# Patient Record
Sex: Male | Born: 1950 | Race: Black or African American | Hispanic: No
Health system: Western US, Academic
[De-identification: ages and names within clinical notes are randomized; demographics above are authoritative.]

## PROBLEM LIST (undated history)

## (undated) DIAGNOSIS — I219 Acute myocardial infarction, unspecified: Secondary | ICD-10-CM

## (undated) DIAGNOSIS — Z8639 Personal history of other endocrine, nutritional and metabolic disease: Secondary | ICD-10-CM

## (undated) DIAGNOSIS — N289 Disorder of kidney and ureter, unspecified: Secondary | ICD-10-CM

## (undated) DIAGNOSIS — E119 Type 2 diabetes mellitus without complications: Secondary | ICD-10-CM

## (undated) DIAGNOSIS — I2699 Other pulmonary embolism without acute cor pulmonale: Secondary | ICD-10-CM

## (undated) DIAGNOSIS — Z923 Personal history of irradiation: Secondary | ICD-10-CM

## (undated) HISTORY — DX: Acute myocardial infarction, unspecified: I21.9

## (undated) HISTORY — DX: Personal history of other endocrine, nutritional and metabolic disease: Z86.39

## (undated) HISTORY — DX: Other pulmonary embolism without acute cor pulmonale: I26.99

## (undated) HISTORY — DX: Disorder of kidney and ureter, unspecified: N28.9

## (undated) HISTORY — PX: ANKLE SURGERY: SHX546

## (undated) HISTORY — DX: Personal history of irradiation: Z92.3

## (undated) HISTORY — PX: PR UNLISTED PROCEDURE LEG/ANKLE: 27899

## (undated) HISTORY — DX: Type 2 diabetes mellitus without complications: E11.9

## (undated) MED ORDER — COLCRYS 0.6 MG OR TABS
ORAL_TABLET | ORAL | Status: AC
Start: 2016-05-09 — End: ?

## (undated) MED ORDER — COLCRYS 0.6 MG OR TABS
ORAL_TABLET | ORAL | Status: AC
Start: 2016-05-20 — End: ?

## (undated) MED ORDER — COLCRYS 0.6 MG OR TABS
ORAL_TABLET | ORAL | 0 refills | Status: AC
Start: 2016-05-01 — End: ?

## (undated) DEATH — deceased

---

## 1999-10-15 ENCOUNTER — Encounter (HOSPITAL_BASED_OUTPATIENT_CLINIC_OR_DEPARTMENT_OTHER): Payer: Self-pay

## 1999-12-23 ENCOUNTER — Encounter (HOSPITAL_BASED_OUTPATIENT_CLINIC_OR_DEPARTMENT_OTHER): Payer: Self-pay

## 2000-09-25 ENCOUNTER — Encounter (HOSPITAL_BASED_OUTPATIENT_CLINIC_OR_DEPARTMENT_OTHER): Payer: Self-pay

## 2000-12-29 ENCOUNTER — Encounter (HOSPITAL_BASED_OUTPATIENT_CLINIC_OR_DEPARTMENT_OTHER): Payer: Self-pay

## 2001-01-14 ENCOUNTER — Ambulatory Visit (HOSPITAL_BASED_OUTPATIENT_CLINIC_OR_DEPARTMENT_OTHER): Payer: Self-pay

## 2001-01-18 ENCOUNTER — Encounter (HOSPITAL_BASED_OUTPATIENT_CLINIC_OR_DEPARTMENT_OTHER): Payer: Self-pay

## 2001-01-22 ENCOUNTER — Encounter (HOSPITAL_BASED_OUTPATIENT_CLINIC_OR_DEPARTMENT_OTHER): Payer: Medicaid Other

## 2001-01-25 ENCOUNTER — Encounter (HOSPITAL_BASED_OUTPATIENT_CLINIC_OR_DEPARTMENT_OTHER): Payer: Medicaid Other

## 2001-01-27 ENCOUNTER — Encounter (HOSPITAL_BASED_OUTPATIENT_CLINIC_OR_DEPARTMENT_OTHER): Payer: Self-pay | Admitting: Rehabilitative and Restorative Service Providers"

## 2001-02-04 ENCOUNTER — Encounter (HOSPITAL_BASED_OUTPATIENT_CLINIC_OR_DEPARTMENT_OTHER): Payer: Medicaid Other | Admitting: Rehabilitative and Restorative Service Providers"

## 2001-02-15 ENCOUNTER — Encounter (HOSPITAL_BASED_OUTPATIENT_CLINIC_OR_DEPARTMENT_OTHER): Payer: Medicaid Other | Admitting: Rehabilitative and Restorative Service Providers"

## 2001-02-18 ENCOUNTER — Encounter (HOSPITAL_BASED_OUTPATIENT_CLINIC_OR_DEPARTMENT_OTHER): Payer: Medicaid Other

## 2001-02-22 ENCOUNTER — Encounter (HOSPITAL_BASED_OUTPATIENT_CLINIC_OR_DEPARTMENT_OTHER): Payer: Medicaid Other

## 2001-02-24 ENCOUNTER — Encounter (HOSPITAL_BASED_OUTPATIENT_CLINIC_OR_DEPARTMENT_OTHER): Payer: Medicaid Other

## 2001-03-01 ENCOUNTER — Encounter (HOSPITAL_BASED_OUTPATIENT_CLINIC_OR_DEPARTMENT_OTHER): Payer: Medicaid Other

## 2001-03-04 ENCOUNTER — Encounter (HOSPITAL_BASED_OUTPATIENT_CLINIC_OR_DEPARTMENT_OTHER): Payer: Self-pay

## 2001-03-04 ENCOUNTER — Encounter (HOSPITAL_BASED_OUTPATIENT_CLINIC_OR_DEPARTMENT_OTHER): Payer: Medicaid Other

## 2001-03-08 ENCOUNTER — Encounter (HOSPITAL_BASED_OUTPATIENT_CLINIC_OR_DEPARTMENT_OTHER): Payer: Medicaid Other

## 2001-03-10 ENCOUNTER — Encounter (HOSPITAL_BASED_OUTPATIENT_CLINIC_OR_DEPARTMENT_OTHER): Payer: Medicaid Other

## 2001-03-17 ENCOUNTER — Encounter (HOSPITAL_BASED_OUTPATIENT_CLINIC_OR_DEPARTMENT_OTHER): Payer: Self-pay

## 2001-03-23 ENCOUNTER — Encounter (HOSPITAL_BASED_OUTPATIENT_CLINIC_OR_DEPARTMENT_OTHER): Payer: Medicaid Other | Admitting: Rehabilitative and Restorative Service Providers"

## 2001-09-30 ENCOUNTER — Encounter (HOSPITAL_BASED_OUTPATIENT_CLINIC_OR_DEPARTMENT_OTHER): Payer: Medicaid Other

## 2001-10-28 ENCOUNTER — Encounter (HOSPITAL_BASED_OUTPATIENT_CLINIC_OR_DEPARTMENT_OTHER): Payer: Medicaid Other

## 2002-06-20 ENCOUNTER — Ambulatory Visit (EMERGENCY_DEPARTMENT_HOSPITAL): Payer: Medicaid Other

## 2002-06-22 ENCOUNTER — Encounter (HOSPITAL_BASED_OUTPATIENT_CLINIC_OR_DEPARTMENT_OTHER): Payer: Medicaid Other

## 2002-08-24 ENCOUNTER — Encounter (HOSPITAL_BASED_OUTPATIENT_CLINIC_OR_DEPARTMENT_OTHER): Payer: Medicaid Other | Admitting: Registered Nurse

## 2002-09-07 ENCOUNTER — Encounter (HOSPITAL_BASED_OUTPATIENT_CLINIC_OR_DEPARTMENT_OTHER): Payer: Medicaid Other | Admitting: Registered Nurse

## 2002-09-28 ENCOUNTER — Ambulatory Visit (EMERGENCY_DEPARTMENT_HOSPITAL): Payer: Medicaid Other

## 2002-10-11 ENCOUNTER — Encounter (HOSPITAL_BASED_OUTPATIENT_CLINIC_OR_DEPARTMENT_OTHER): Payer: Medicaid Other

## 2003-01-25 ENCOUNTER — Ambulatory Visit (EMERGENCY_DEPARTMENT_HOSPITAL): Payer: Medicaid Other

## 2003-09-20 ENCOUNTER — Encounter (HOSPITAL_BASED_OUTPATIENT_CLINIC_OR_DEPARTMENT_OTHER): Payer: Medicaid Other | Admitting: Registered Nurse

## 2003-10-23 ENCOUNTER — Encounter (HOSPITAL_BASED_OUTPATIENT_CLINIC_OR_DEPARTMENT_OTHER): Payer: Medicaid Other | Admitting: Physician Assistant

## 2003-11-29 ENCOUNTER — Encounter (HOSPITAL_BASED_OUTPATIENT_CLINIC_OR_DEPARTMENT_OTHER): Payer: Medicaid Other

## 2004-03-30 ENCOUNTER — Ambulatory Visit (EMERGENCY_DEPARTMENT_HOSPITAL): Payer: Medicaid Other

## 2004-03-30 ENCOUNTER — Encounter (HOSPITAL_BASED_OUTPATIENT_CLINIC_OR_DEPARTMENT_OTHER): Payer: Medicaid Other | Admitting: Rehabilitative and Restorative Service Providers"

## 2004-04-28 ENCOUNTER — Ambulatory Visit (EMERGENCY_DEPARTMENT_HOSPITAL): Payer: Medicaid Other

## 2004-05-02 ENCOUNTER — Encounter (HOSPITAL_BASED_OUTPATIENT_CLINIC_OR_DEPARTMENT_OTHER): Payer: Medicaid Other | Admitting: Nurse Practitioner

## 2004-07-10 ENCOUNTER — Ambulatory Visit (EMERGENCY_DEPARTMENT_HOSPITAL): Payer: Medicaid Other

## 2004-07-23 ENCOUNTER — Ambulatory Visit (EMERGENCY_DEPARTMENT_HOSPITAL): Payer: Medicaid Other

## 2004-07-31 ENCOUNTER — Ambulatory Visit (EMERGENCY_DEPARTMENT_HOSPITAL): Payer: Medicaid Other

## 2004-08-13 ENCOUNTER — Ambulatory Visit (EMERGENCY_DEPARTMENT_HOSPITAL): Payer: Medicaid Other

## 2004-08-15 ENCOUNTER — Encounter (HOSPITAL_BASED_OUTPATIENT_CLINIC_OR_DEPARTMENT_OTHER): Payer: Medicaid Other

## 2004-09-06 ENCOUNTER — Encounter (HOSPITAL_BASED_OUTPATIENT_CLINIC_OR_DEPARTMENT_OTHER): Payer: Medicaid Other | Admitting: Nurse Practitioner

## 2004-09-13 ENCOUNTER — Encounter (HOSPITAL_BASED_OUTPATIENT_CLINIC_OR_DEPARTMENT_OTHER): Payer: Medicaid Other

## 2004-09-17 ENCOUNTER — Encounter (HOSPITAL_BASED_OUTPATIENT_CLINIC_OR_DEPARTMENT_OTHER): Payer: Medicaid Other

## 2004-09-27 ENCOUNTER — Encounter (HOSPITAL_BASED_OUTPATIENT_CLINIC_OR_DEPARTMENT_OTHER): Payer: Medicaid Other

## 2004-10-21 ENCOUNTER — Encounter (HOSPITAL_BASED_OUTPATIENT_CLINIC_OR_DEPARTMENT_OTHER): Payer: Medicaid Other | Admitting: Nurse Practitioner

## 2004-10-25 ENCOUNTER — Encounter (HOSPITAL_BASED_OUTPATIENT_CLINIC_OR_DEPARTMENT_OTHER): Payer: Medicaid Other

## 2005-01-18 ENCOUNTER — Encounter (HOSPITAL_BASED_OUTPATIENT_CLINIC_OR_DEPARTMENT_OTHER): Payer: Medicaid Other | Admitting: Rehabilitative and Restorative Service Providers"

## 2005-01-18 ENCOUNTER — Ambulatory Visit (EMERGENCY_DEPARTMENT_HOSPITAL): Payer: Medicaid Other

## 2005-02-12 ENCOUNTER — Encounter (HOSPITAL_BASED_OUTPATIENT_CLINIC_OR_DEPARTMENT_OTHER): Payer: Medicaid Other | Admitting: Nurse Practitioner

## 2005-02-25 ENCOUNTER — Ambulatory Visit (HOSPITAL_BASED_OUTPATIENT_CLINIC_OR_DEPARTMENT_OTHER): Payer: Medicaid Other

## 2005-03-04 ENCOUNTER — Encounter (HOSPITAL_BASED_OUTPATIENT_CLINIC_OR_DEPARTMENT_OTHER): Payer: Medicaid Other

## 2005-03-12 ENCOUNTER — Encounter (HOSPITAL_BASED_OUTPATIENT_CLINIC_OR_DEPARTMENT_OTHER): Payer: Medicaid Other | Admitting: Rehabilitative and Restorative Service Providers"

## 2005-03-31 ENCOUNTER — Ambulatory Visit (EMERGENCY_DEPARTMENT_HOSPITAL): Payer: Medicaid Other

## 2005-04-08 ENCOUNTER — Encounter (HOSPITAL_BASED_OUTPATIENT_CLINIC_OR_DEPARTMENT_OTHER): Payer: Medicaid Other

## 2005-06-16 ENCOUNTER — Encounter (HOSPITAL_BASED_OUTPATIENT_CLINIC_OR_DEPARTMENT_OTHER): Payer: Medicaid Other | Admitting: Hospitalist

## 2005-09-22 ENCOUNTER — Encounter (HOSPITAL_BASED_OUTPATIENT_CLINIC_OR_DEPARTMENT_OTHER): Payer: Medicaid Other | Admitting: Hospitalist

## 2005-10-07 ENCOUNTER — Encounter (HOSPITAL_BASED_OUTPATIENT_CLINIC_OR_DEPARTMENT_OTHER): Payer: Medicaid Other | Admitting: Audiologist

## 2005-10-14 ENCOUNTER — Encounter (HOSPITAL_BASED_OUTPATIENT_CLINIC_OR_DEPARTMENT_OTHER): Payer: Medicaid Other | Admitting: Audiologist

## 2006-03-06 ENCOUNTER — Encounter (HOSPITAL_BASED_OUTPATIENT_CLINIC_OR_DEPARTMENT_OTHER): Payer: Medicaid Other

## 2006-03-27 ENCOUNTER — Encounter (HOSPITAL_BASED_OUTPATIENT_CLINIC_OR_DEPARTMENT_OTHER): Payer: Medicaid Other | Admitting: Hospitalist

## 2006-05-26 ENCOUNTER — Encounter (HOSPITAL_BASED_OUTPATIENT_CLINIC_OR_DEPARTMENT_OTHER): Payer: Self-pay | Admitting: Hospitalist

## 2006-06-15 ENCOUNTER — Encounter (HOSPITAL_BASED_OUTPATIENT_CLINIC_OR_DEPARTMENT_OTHER): Payer: Self-pay | Admitting: Hospitalist

## 2006-06-29 ENCOUNTER — Encounter (HOSPITAL_BASED_OUTPATIENT_CLINIC_OR_DEPARTMENT_OTHER): Payer: Medicaid Other | Admitting: Hospitalist

## 2006-07-31 ENCOUNTER — Ambulatory Visit (EMERGENCY_DEPARTMENT_HOSPITAL): Payer: Medicaid Other

## 2006-08-05 ENCOUNTER — Encounter (HOSPITAL_BASED_OUTPATIENT_CLINIC_OR_DEPARTMENT_OTHER): Payer: Medicaid Other

## 2006-09-16 ENCOUNTER — Ambulatory Visit (EMERGENCY_DEPARTMENT_HOSPITAL): Payer: Medicaid Other

## 2006-10-25 ENCOUNTER — Ambulatory Visit (EMERGENCY_DEPARTMENT_HOSPITAL): Payer: Medicaid Other

## 2006-10-30 ENCOUNTER — Encounter (HOSPITAL_BASED_OUTPATIENT_CLINIC_OR_DEPARTMENT_OTHER): Payer: Medicaid Other | Admitting: Hospitalist

## 2006-11-03 ENCOUNTER — Encounter (HOSPITAL_BASED_OUTPATIENT_CLINIC_OR_DEPARTMENT_OTHER): Payer: Medicaid Other | Admitting: Rehabilitative and Restorative Service Providers"

## 2006-11-06 ENCOUNTER — Encounter (HOSPITAL_BASED_OUTPATIENT_CLINIC_OR_DEPARTMENT_OTHER): Payer: Medicaid Other | Admitting: Registered"

## 2006-11-09 ENCOUNTER — Encounter (HOSPITAL_BASED_OUTPATIENT_CLINIC_OR_DEPARTMENT_OTHER): Payer: Medicaid Other | Admitting: Rehabilitative and Restorative Service Providers"

## 2006-11-12 ENCOUNTER — Encounter (HOSPITAL_BASED_OUTPATIENT_CLINIC_OR_DEPARTMENT_OTHER): Payer: Medicaid Other | Admitting: Rehabilitative and Restorative Service Providers"

## 2006-11-13 ENCOUNTER — Encounter (HOSPITAL_BASED_OUTPATIENT_CLINIC_OR_DEPARTMENT_OTHER): Payer: Medicaid Other | Admitting: Registered"

## 2006-11-26 ENCOUNTER — Encounter (HOSPITAL_BASED_OUTPATIENT_CLINIC_OR_DEPARTMENT_OTHER): Payer: Medicaid Other | Admitting: Rehabilitative and Restorative Service Providers"

## 2006-12-03 ENCOUNTER — Encounter (HOSPITAL_BASED_OUTPATIENT_CLINIC_OR_DEPARTMENT_OTHER): Payer: Medicaid Other | Admitting: Rehabilitative and Restorative Service Providers"

## 2007-01-28 ENCOUNTER — Ambulatory Visit (EMERGENCY_DEPARTMENT_HOSPITAL): Payer: Medicaid Other

## 2007-02-03 ENCOUNTER — Encounter (HOSPITAL_BASED_OUTPATIENT_CLINIC_OR_DEPARTMENT_OTHER): Payer: Medicaid Other

## 2007-04-06 ENCOUNTER — Ambulatory Visit (EMERGENCY_DEPARTMENT_HOSPITAL): Payer: Medicaid Other

## 2007-06-22 ENCOUNTER — Encounter (HOSPITAL_BASED_OUTPATIENT_CLINIC_OR_DEPARTMENT_OTHER): Payer: Self-pay

## 2007-06-22 ENCOUNTER — Encounter (HOSPITAL_BASED_OUTPATIENT_CLINIC_OR_DEPARTMENT_OTHER): Payer: Medicaid Other

## 2007-08-21 ENCOUNTER — Ambulatory Visit (EMERGENCY_DEPARTMENT_HOSPITAL): Payer: Medicaid Other

## 2007-08-25 ENCOUNTER — Encounter (HOSPITAL_BASED_OUTPATIENT_CLINIC_OR_DEPARTMENT_OTHER): Payer: Medicaid Other

## 2007-08-26 ENCOUNTER — Encounter (HOSPITAL_BASED_OUTPATIENT_CLINIC_OR_DEPARTMENT_OTHER): Payer: Medicaid Other

## 2007-10-05 ENCOUNTER — Encounter (HOSPITAL_BASED_OUTPATIENT_CLINIC_OR_DEPARTMENT_OTHER): Payer: Medicaid Other

## 2007-11-05 ENCOUNTER — Encounter (HOSPITAL_BASED_OUTPATIENT_CLINIC_OR_DEPARTMENT_OTHER): Payer: Medicaid Other

## 2008-02-04 ENCOUNTER — Ambulatory Visit (EMERGENCY_DEPARTMENT_HOSPITAL): Payer: Medicaid Other

## 2008-03-13 ENCOUNTER — Encounter (HOSPITAL_BASED_OUTPATIENT_CLINIC_OR_DEPARTMENT_OTHER): Payer: Medicaid Other

## 2008-03-14 ENCOUNTER — Encounter (HOSPITAL_BASED_OUTPATIENT_CLINIC_OR_DEPARTMENT_OTHER): Payer: Medicaid Other

## 2008-03-17 ENCOUNTER — Encounter (HOSPITAL_BASED_OUTPATIENT_CLINIC_OR_DEPARTMENT_OTHER): Payer: Self-pay

## 2008-03-17 ENCOUNTER — Encounter (HOSPITAL_BASED_OUTPATIENT_CLINIC_OR_DEPARTMENT_OTHER): Payer: Medicaid Other

## 2008-05-08 ENCOUNTER — Encounter (HOSPITAL_BASED_OUTPATIENT_CLINIC_OR_DEPARTMENT_OTHER): Payer: Medicaid Other

## 2008-05-26 ENCOUNTER — Encounter (HOSPITAL_BASED_OUTPATIENT_CLINIC_OR_DEPARTMENT_OTHER): Payer: Medicaid Other

## 2008-06-29 ENCOUNTER — Ambulatory Visit (HOSPITAL_BASED_OUTPATIENT_CLINIC_OR_DEPARTMENT_OTHER): Payer: Medicaid Other

## 2008-07-04 ENCOUNTER — Ambulatory Visit (HOSPITAL_BASED_OUTPATIENT_CLINIC_OR_DEPARTMENT_OTHER): Payer: Medicaid Other | Admitting: Rehabilitative and Restorative Service Providers"

## 2008-07-10 ENCOUNTER — Encounter (HOSPITAL_BASED_OUTPATIENT_CLINIC_OR_DEPARTMENT_OTHER): Payer: Medicaid Other | Admitting: Rehabilitative and Restorative Service Providers"

## 2008-07-13 ENCOUNTER — Encounter (HOSPITAL_BASED_OUTPATIENT_CLINIC_OR_DEPARTMENT_OTHER): Payer: Medicaid Other | Admitting: Rehabilitative and Restorative Service Providers"

## 2008-07-20 ENCOUNTER — Encounter (HOSPITAL_BASED_OUTPATIENT_CLINIC_OR_DEPARTMENT_OTHER): Payer: Medicaid Other

## 2008-07-27 ENCOUNTER — Encounter (HOSPITAL_BASED_OUTPATIENT_CLINIC_OR_DEPARTMENT_OTHER): Payer: Medicaid Other | Admitting: Rehabilitative and Restorative Service Providers"

## 2008-07-31 ENCOUNTER — Encounter (HOSPITAL_BASED_OUTPATIENT_CLINIC_OR_DEPARTMENT_OTHER): Payer: Medicaid Other

## 2008-08-10 ENCOUNTER — Encounter (HOSPITAL_BASED_OUTPATIENT_CLINIC_OR_DEPARTMENT_OTHER): Payer: Medicaid Other

## 2008-08-14 ENCOUNTER — Encounter (HOSPITAL_BASED_OUTPATIENT_CLINIC_OR_DEPARTMENT_OTHER): Payer: Medicaid Other

## 2008-08-16 ENCOUNTER — Encounter (HOSPITAL_BASED_OUTPATIENT_CLINIC_OR_DEPARTMENT_OTHER): Payer: Medicaid Other

## 2008-08-21 ENCOUNTER — Encounter (HOSPITAL_BASED_OUTPATIENT_CLINIC_OR_DEPARTMENT_OTHER): Payer: Medicaid Other

## 2008-08-29 ENCOUNTER — Encounter (HOSPITAL_BASED_OUTPATIENT_CLINIC_OR_DEPARTMENT_OTHER): Payer: Medicaid Other

## 2008-10-25 ENCOUNTER — Ambulatory Visit (EMERGENCY_DEPARTMENT_HOSPITAL): Payer: Medicaid Other

## 2008-11-24 ENCOUNTER — Encounter (HOSPITAL_BASED_OUTPATIENT_CLINIC_OR_DEPARTMENT_OTHER): Payer: Self-pay

## 2008-11-24 ENCOUNTER — Ambulatory Visit (HOSPITAL_BASED_OUTPATIENT_CLINIC_OR_DEPARTMENT_OTHER): Payer: Medicaid Other

## 2008-11-24 DIAGNOSIS — E785 Hyperlipidemia, unspecified: Secondary | ICD-10-CM

## 2008-11-24 DIAGNOSIS — IMO0002 Reserved for concepts with insufficient information to code with codable children: Secondary | ICD-10-CM

## 2008-11-24 DIAGNOSIS — E669 Obesity, unspecified: Secondary | ICD-10-CM

## 2008-11-24 DIAGNOSIS — M159 Polyosteoarthritis, unspecified: Secondary | ICD-10-CM

## 2008-11-24 DIAGNOSIS — J45902 Unspecified asthma with status asthmaticus: Secondary | ICD-10-CM

## 2008-11-24 HISTORY — DX: Unspecified asthma with status asthmaticus: J45.902

## 2008-11-24 HISTORY — DX: Obesity, unspecified: E66.9

## 2008-11-24 HISTORY — DX: Polyosteoarthritis, unspecified: M15.9

## 2008-11-24 HISTORY — DX: Hyperlipidemia, unspecified: E78.5

## 2008-11-24 HISTORY — DX: Reserved for concepts with insufficient information to code with codable children: IMO0002

## 2008-11-24 NOTE — Progress Notes (Signed)
HPI:    58 year old male complaining of blurry vision with reading for the last 20 years.  He says he has tried reading glasses in the past, which have helped, but he has lost his reading glasses.  Otherwise, no complaints with his distance vision, no flashing lights/floaters.    Past Medical History:  1. 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 GERD  3. Obesity.  4. Asthma.  5. Seasonal allergies.  6. History of gout in the past  7. Osteoarthritis in left knee  8. Hyperlipidemia.   9. History of H. pylori, status post triple drug therapy in October 2005.   10. L ear hi frequency hearing loss.  11. Possible obstructive sleep apnea.    ROS:   Is pain part of the reason you came to clinic today? No  Wears glasses: NO  Wears contact lenses:NO  Eye symptoms: as per HPI  Fourteen point review of systems is otherwise negative   Medical, surgical, family history reviewed today: YES    PHYSICAL EXAM:  Eyes: See Eye Exam   Constitutional: Appears healthy   Psychiatric: Mood normal   Neurologic: Face is symmetric   Skin: Facial rashes: None   Head: Atraumatic   ENT: External ears and nose are normal in appearance   Respiratory: Normal respiratory effort     Eye Exam: See above    Assessment/Plan:  1) Presbyopia  - Discussed with patient the need for reading glasses as he ages.  He understands and is happy to try reading glasses  - Recommend +2.0 D lenses    Return as needed

## 2009-02-12 ENCOUNTER — Ambulatory Visit (HOSPITAL_BASED_OUTPATIENT_CLINIC_OR_DEPARTMENT_OTHER): Payer: Self-pay | Admitting: Nurse Practitioner

## 2009-02-28 ENCOUNTER — Ambulatory Visit (HOSPITAL_BASED_OUTPATIENT_CLINIC_OR_DEPARTMENT_OTHER): Payer: Medicaid Other | Attending: Internal Medicine

## 2009-02-28 DIAGNOSIS — E669 Obesity, unspecified: Secondary | ICD-10-CM | POA: Insufficient documentation

## 2009-02-28 DIAGNOSIS — H918X9 Other specified hearing loss, unspecified ear: Secondary | ICD-10-CM | POA: Insufficient documentation

## 2009-02-28 DIAGNOSIS — N289 Disorder of kidney and ureter, unspecified: Secondary | ICD-10-CM | POA: Insufficient documentation

## 2009-02-28 DIAGNOSIS — E785 Hyperlipidemia, unspecified: Secondary | ICD-10-CM | POA: Insufficient documentation

## 2009-02-28 DIAGNOSIS — M1990 Unspecified osteoarthritis, unspecified site: Secondary | ICD-10-CM | POA: Insufficient documentation

## 2009-03-05 ENCOUNTER — Ambulatory Visit (HOSPITAL_BASED_OUTPATIENT_CLINIC_OR_DEPARTMENT_OTHER): Admit: 2009-03-05 | Discharge: 2009-03-05 | Disposition: A | Discharge status: Home / Self Care | Payer: Medicaid Other

## 2009-03-05 DIAGNOSIS — I10 Essential (primary) hypertension: Secondary | ICD-10-CM | POA: Insufficient documentation

## 2009-03-12 ENCOUNTER — Ambulatory Visit (HOSPITAL_BASED_OUTPATIENT_CLINIC_OR_DEPARTMENT_OTHER): Admit: 2009-03-12 | Discharge: 2009-03-12 | Disposition: A | Discharge status: Home / Self Care | Payer: Medicaid Other

## 2009-03-12 DIAGNOSIS — Z0000 Encounter for general adult medical examination without abnormal findings: Secondary | ICD-10-CM | POA: Insufficient documentation

## 2009-09-17 ENCOUNTER — Ambulatory Visit (HOSPITAL_BASED_OUTPATIENT_CLINIC_OR_DEPARTMENT_OTHER): Payer: Medicaid Other | Attending: Internal Medicine

## 2009-09-17 DIAGNOSIS — Z139 Encounter for screening, unspecified: Secondary | ICD-10-CM | POA: Insufficient documentation

## 2009-09-17 DIAGNOSIS — N189 Chronic kidney disease, unspecified: Secondary | ICD-10-CM | POA: Insufficient documentation

## 2009-10-02 ENCOUNTER — Ambulatory Visit (HOSPITAL_BASED_OUTPATIENT_CLINIC_OR_DEPARTMENT_OTHER): Payer: Medicaid Other

## 2009-10-02 DIAGNOSIS — R944 Abnormal results of kidney function studies: Secondary | ICD-10-CM | POA: Insufficient documentation

## 2009-10-15 ENCOUNTER — Ambulatory Visit (HOSPITAL_BASED_OUTPATIENT_CLINIC_OR_DEPARTMENT_OTHER): Payer: Medicaid Other | Admitting: Nurse Practitioner

## 2009-10-23 ENCOUNTER — Ambulatory Visit (HOSPITAL_BASED_OUTPATIENT_CLINIC_OR_DEPARTMENT_OTHER): Payer: Medicaid Other | Attending: Nurse Practitioner | Admitting: Nurse Practitioner

## 2009-10-23 DIAGNOSIS — N289 Disorder of kidney and ureter, unspecified: Secondary | ICD-10-CM | POA: Insufficient documentation

## 2009-10-23 DIAGNOSIS — R030 Elevated blood-pressure reading, without diagnosis of hypertension: Secondary | ICD-10-CM | POA: Insufficient documentation

## 2009-10-23 DIAGNOSIS — G479 Sleep disorder, unspecified: Secondary | ICD-10-CM | POA: Insufficient documentation

## 2009-12-03 ENCOUNTER — Ambulatory Visit (HOSPITAL_BASED_OUTPATIENT_CLINIC_OR_DEPARTMENT_OTHER): Payer: Medicaid Other | Attending: Internal Medicine

## 2009-12-03 DIAGNOSIS — N289 Disorder of kidney and ureter, unspecified: Secondary | ICD-10-CM | POA: Insufficient documentation

## 2009-12-03 DIAGNOSIS — E785 Hyperlipidemia, unspecified: Secondary | ICD-10-CM | POA: Insufficient documentation

## 2009-12-03 DIAGNOSIS — R944 Abnormal results of kidney function studies: Secondary | ICD-10-CM | POA: Insufficient documentation

## 2009-12-03 DIAGNOSIS — H918X9 Other specified hearing loss, unspecified ear: Secondary | ICD-10-CM | POA: Insufficient documentation

## 2009-12-03 DIAGNOSIS — E669 Obesity, unspecified: Secondary | ICD-10-CM | POA: Insufficient documentation

## 2009-12-03 DIAGNOSIS — M1990 Unspecified osteoarthritis, unspecified site: Secondary | ICD-10-CM | POA: Insufficient documentation

## 2009-12-10 ENCOUNTER — Ambulatory Visit (HOSPITAL_BASED_OUTPATIENT_CLINIC_OR_DEPARTMENT_OTHER): Payer: Medicaid Other | Admitting: Registered"

## 2009-12-13 ENCOUNTER — Ambulatory Visit (HOSPITAL_BASED_OUTPATIENT_CLINIC_OR_DEPARTMENT_OTHER): Admit: 2009-12-13 | Discharge: 2009-12-13 | Disposition: A | Discharge status: Home / Self Care | Payer: Medicaid Other

## 2009-12-13 DIAGNOSIS — Z0000 Encounter for general adult medical examination without abnormal findings: Secondary | ICD-10-CM | POA: Insufficient documentation

## 2010-01-11 ENCOUNTER — Encounter (HOSPITAL_BASED_OUTPATIENT_CLINIC_OR_DEPARTMENT_OTHER): Payer: Medicaid Other | Admitting: Registered"

## 2010-03-14 ENCOUNTER — Ambulatory Visit (HOSPITAL_BASED_OUTPATIENT_CLINIC_OR_DEPARTMENT_OTHER): Payer: Medicaid Other | Attending: Internal Medicine | Admitting: Internal Medicine

## 2010-03-14 DIAGNOSIS — M1990 Unspecified osteoarthritis, unspecified site: Secondary | ICD-10-CM | POA: Insufficient documentation

## 2010-03-14 DIAGNOSIS — R030 Elevated blood-pressure reading, without diagnosis of hypertension: Secondary | ICD-10-CM | POA: Insufficient documentation

## 2010-03-14 DIAGNOSIS — E785 Hyperlipidemia, unspecified: Secondary | ICD-10-CM | POA: Insufficient documentation

## 2010-03-14 DIAGNOSIS — L279 Dermatitis due to unspecified substance taken internally: Secondary | ICD-10-CM | POA: Insufficient documentation

## 2010-03-25 ENCOUNTER — Ambulatory Visit (HOSPITAL_BASED_OUTPATIENT_CLINIC_OR_DEPARTMENT_OTHER)
Admit: 2010-03-25 | Discharge: 2010-03-25 | Disposition: A | Discharge status: Home / Self Care | Payer: Medicaid Other | Attending: Internal Medicine | Admitting: Internal Medicine

## 2010-03-25 DIAGNOSIS — E785 Hyperlipidemia, unspecified: Secondary | ICD-10-CM | POA: Insufficient documentation

## 2010-04-05 ENCOUNTER — Encounter (HOSPITAL_BASED_OUTPATIENT_CLINIC_OR_DEPARTMENT_OTHER): Payer: Medicaid Other | Admitting: Internal Medicine

## 2010-06-25 ENCOUNTER — Encounter (HOSPITAL_BASED_OUTPATIENT_CLINIC_OR_DEPARTMENT_OTHER): Payer: Medicaid Other | Admitting: Internal Medicine

## 2010-08-17 ENCOUNTER — Other Ambulatory Visit: Payer: Self-pay | Admitting: Pharmacist

## 2010-08-17 MED ORDER — ROSUVASTATIN CALCIUM 10 MG OR TABS
ORAL_TABLET | ORAL | Status: DC
Start: 2010-08-17 — End: 2010-09-19

## 2010-08-17 NOTE — Telephone Encounter (Signed)
Last visit = 03/14/10, no show = 06/25/10

## 2010-09-19 ENCOUNTER — Other Ambulatory Visit: Payer: Self-pay | Admitting: Pharmacist

## 2010-09-19 MED ORDER — ROSUVASTATIN CALCIUM 10 MG OR TABS
ORAL_TABLET | ORAL | Status: DC
Start: 2010-09-19 — End: 2010-09-26

## 2010-09-19 MED ORDER — ASPIRIN 81 MG OR TBEC
DELAYED_RELEASE_TABLET | ORAL | Status: DC
Start: 2010-09-19 — End: 2010-09-26

## 2010-09-19 NOTE — Telephone Encounter (Signed)
Last visit = 03/14/10, no show = 06/25/10

## 2010-09-19 NOTE — Telephone Encounter (Signed)
Addended by: Shellia Carwin on: 09/19/2010 12:31 PM     Modules accepted: Orders

## 2010-09-26 ENCOUNTER — Ambulatory Visit (HOSPITAL_BASED_OUTPATIENT_CLINIC_OR_DEPARTMENT_OTHER): Payer: Medicaid Other | Attending: Internal Medicine | Admitting: Internal Medicine

## 2010-09-26 ENCOUNTER — Encounter (HOSPITAL_BASED_OUTPATIENT_CLINIC_OR_DEPARTMENT_OTHER): Payer: Self-pay | Admitting: Internal Medicine

## 2010-09-26 VITALS — BP 129/84 | HR 50 | Temp 97.0°F | Ht 67.36 in | Wt 241.4 lb

## 2010-09-26 DIAGNOSIS — E785 Hyperlipidemia, unspecified: Secondary | ICD-10-CM | POA: Insufficient documentation

## 2010-09-26 DIAGNOSIS — Z23 Encounter for immunization: Secondary | ICD-10-CM | POA: Insufficient documentation

## 2010-09-26 MED ORDER — FLUTICASONE PROPIONATE HFA 110 MCG/ACT IN AERO
INHALATION_SPRAY | RESPIRATORY_TRACT | Status: DC
Start: 2010-09-26 — End: 2011-01-21

## 2010-09-26 MED ORDER — ALBUTEROL SULFATE HFA 108 (90 BASE) MCG/ACT IN AERS
INHALATION_SPRAY | RESPIRATORY_TRACT | Status: DC
Start: 2010-09-26 — End: 2011-01-21

## 2010-09-26 MED ORDER — ASPIRIN 81 MG OR TBEC
DELAYED_RELEASE_TABLET | ORAL | Status: DC
Start: 2010-09-26 — End: 2011-01-21

## 2010-09-26 MED ORDER — ROSUVASTATIN CALCIUM 10 MG OR TABS
ORAL_TABLET | ORAL | Status: DC
Start: 2010-09-26 — End: 2011-01-21

## 2010-09-26 MED ORDER — FLUTICASONE PROPIONATE 50 MCG/ACT NA SUSP
NASAL | Status: DC
Start: 2010-09-26 — End: 2011-01-21

## 2010-09-26 MED ORDER — LORATADINE 10 MG OR TABS
ORAL_TABLET | ORAL | Status: DC
Start: 2010-09-26 — End: 2011-01-21

## 2010-09-26 NOTE — Progress Notes (Signed)
VACCINE SCREENING/ORDER WHEN CONTRAINDICATIONS PRESENT    Order date: 09/26/2010  Ordering provider: logerfo  Clinic stock used: YES   Interpreter used?: None  Vaccine information sheet(s) discussed, patient/parent/guardian verbalized understanding? YES   Vaccines given: influenza    SCREENING: INACTIVATED VACCINES  NO 1. Do you have a high fever, severe cold-like symptoms or serious infection?  NO 2. Do you have any food allergies such as eggs, chicken, chicken feathers, chicken dander, or gelatin?  NO 3. Do you have any allergies to neomycin, streptomycin, or polymyxin?   NO 4. Have you had any severe reaction to a vaccine in the past such as a seizure, a convulsion from a high fever, or a paralysis problem called Guillain-Barre Syndrome? If so, which vaccine(s):     If the patient/parent/or guardian answered "yes" to any of the above questions, consult with the provider to review the responses prior to administering vaccination. Parents with a contraindication to immunization will not receive the immunization without a specific physician order to vaccinate the patient and discussion of risk and benefits related to the contraindication.     Physician Order for Administration of Vaccine(s) When Contraindications Are Present  I have reviewed the existence in this patient's health history of possible contraindication(s) to administration of vaccine. The benefits to this patient outweigh the potential risks of immunization. Administer vaccine(s):  Jena Gauss, 09/26/2010 1:47 PM

## 2010-09-26 NOTE — Progress Notes (Signed)
Pt of Dr. Molli Posey seen for monitoring/refill meds for asthma, hyperlipidemia and concerns about recent bout of diarrheal illness.  Gastroenteritis:  5 days ago had onset non-blood diarrhea, no vomiting. Lasted ~ 2 days, since then had 1 formed stool, now feels "full" on right side abdomen.    Asthma: Using albuterol about twice pre week; not using fluticasone regularly; and out of inhaler. No emergency visits since last seen; no nocturnal cough.    Hyperlipidemia on statin: No myalgias.    Heavy set AA male, NAD.    BP 129/84  Pulse 50  Temp(Src) 97 F (36.1 C) (Temporal)  Ht 5' 7.36" (1.711 m)  Wt 241 lb 6.4 oz (109.498 kg)  BMI 37.40 kg/m2  SpO2 99%  No jaundice  Chest Clear  Abd: soft/ non tender. No Masses noted.    Assess: Prob viral gastroenteritis resolved                Asthma, mod persistent;                 Statin use without complication  Plan: see orders. F/u Dr. Dorathy Kinsman in May if appt avail

## 2010-09-26 NOTE — Patient Instructions (Signed)
Use the fluticasone (CONTROLLER MED) every day for asthma

## 2010-11-12 ENCOUNTER — Ambulatory Visit (HOSPITAL_BASED_OUTPATIENT_CLINIC_OR_DEPARTMENT_OTHER): Payer: Medicaid Other | Attending: Internal Medicine | Admitting: Internal Medicine

## 2010-11-12 VITALS — BP 123/83 | HR 68 | Temp 96.3°F | Resp 16 | Ht 66.0 in | Wt 237.4 lb

## 2010-11-12 DIAGNOSIS — E785 Hyperlipidemia, unspecified: Secondary | ICD-10-CM | POA: Insufficient documentation

## 2010-11-12 NOTE — Progress Notes (Signed)
-------------------------------------------    Attending: Cathe Mons, MD  I discussed the history, exam and medical decision making for this patient with Dr. Dorathy Kinsman, Alvis Lemmings M. I was present in the clinic during the provisions of these services with no other clinical duties. I concur with the management plan as noted above.  -------------------------------------------

## 2010-11-12 NOTE — Progress Notes (Signed)
[facility] - Outpatient Return Clinic Note    DATE: 11/12/2010     Nathan [name] is a 60 year old man who is here today for follow up.   An interpreter was not needed for the visit.    PROBLEM LIST:  Patient Active Problem List   Diagnoses Date Noted   . OBESITY NOS 11/24/2008   . ASTHMA NOS W STATUS ASTHMATICUS 11/24/2008   . GENERAL OSTEOARTHROSIS 11/24/2008   . HYPERLIPIDEMIA NEC/NOS 11/24/2008   . 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 PRESBYOPIA 11/24/2008     MEDICATIONS:  Current Outpatient Prescriptions   Medication Sig   . ALBUTEROL SULFATE 108 (90 BASE) MCG/ACT Inhalation Aero Soln Inhale 2 puffs by mouth every 6 hours if needed for shortness of breath   . Aspirin 81 MG Oral Tab EC Take 1 tablet by mouth once daily (Please make an appointment-2nd request)   . Fluticasone Propionate  HFA 110 MCG/ACT Inhalation Aerosol Inhale 2 puffs by mouth every 12 hours. Use with spacer chamber.   . Fluticasone Propionate 50 MCG/ACT Nasal Suspension Spray 2 times into each nostril daily   . Loratadine 10 MG Oral Tab 1 each day for allergy   . Rosuvastatin Calcium 10 MG Oral Tab Take 1 tablet by mouth once daily to lower cholesterol (Please make an appointment-2nd request)     ALLERGIES:  Patient  has no known allergies.    INTERVAL HISTORY/HPI:  [name] is a pleasant 60 yo man with HLD and asthma who presents to clinic for follow up.  He was last seen by me at [facility] about 9 months ago.  At that time there was concern of CKD (baseline Cr 1.5) of unclear etiology.  Work up at that point with renal US and UA was negative.  A repeat chemistry in Sept 2011 revealed a normal Cr of 1.      Since the last visit patient has been doing well.  He is without complaint today.  He reports well controlled asthma on albuterol and fluticasone.  He does not use the albuterol or fluticasone daily.  We discussed using the fluticasone twice daily and the albuterol as needed.        PHYSICAL EXAM:  BP 123/83  Pulse 68   Temp(Src) 96.3 F (35.7 C) (Temporal)  Resp 16  Ht 5\' 6"  (1.676 m)  Wt 237 lb 6.4 oz (107.684 kg)  BMI 38.32 kg/m2  Wt Readings from Last 3 Encounters:   11/12/10 237 lb 6.4 oz (107.684 kg)   09/26/10 241 lb 6.4 oz (109.498 kg)     BP Readings from Last 3 Encounters:   11/12/10 123/83   09/26/10 129/84     Gen: pleasant gentleman, NAD    HEENT: anicteric sclera, MMM, OP clear  Extr: trace edema b/l LE, 2+ radial pulses  Neuro: A&Ox3, face symmetric, moving all extremities, gait stable, narrow-based    LAB RESULTS:  No results found for this or any previous visit.    ASSESSMENT AND PLAN:  #.  Asthma: Well controlled.  Re-educated patient on use of inhalers.  Encouraged BID use of fluticasone and albuterol PRN.      #.  HLD: Lipids within goal in Sept 2011 (LDL 96 and HDL 42).  Cont current regimen on rosuvastatin.  - rosuvastatin 10mg  Qday    #.  H/o elevated Cr:  Repeat Cr in Sept 2011 was wnl (Cr 1).  - monitor    #.  Probable  sleep apnea:  Declines sleep study.  Will need to consider referral to sleep clinic when patient is interested.    #.  Health maintenance:  - Obtain screening HbA1c prior to next visit  - Given FOBT cards today for 2012 screening  - Declined tdap today, will ask again at next visit      #.  Follow up: 2 months    Patient was discussed with Dr. Dorna Bloom.

## 2010-11-21 ENCOUNTER — Ambulatory Visit (HOSPITAL_BASED_OUTPATIENT_CLINIC_OR_DEPARTMENT_OTHER)
Admit: 2010-11-21 | Discharge: 2010-11-21 | Disposition: A | Discharge status: Home / Self Care | Payer: Medicaid Other | Attending: Internal Medicine | Admitting: Internal Medicine

## 2010-11-21 DIAGNOSIS — Z0000 Encounter for general adult medical examination without abnormal findings: Secondary | ICD-10-CM | POA: Insufficient documentation

## 2010-11-21 LAB — OCCULT BLOOD BY IA, STL
Occult Bld 1 Result: NEGATIVE
Occult Bld 2 Result: NEGATIVE
Occult Bld 3 Result: NEGATIVE

## 2010-11-27 ENCOUNTER — Encounter (HOSPITAL_BASED_OUTPATIENT_CLINIC_OR_DEPARTMENT_OTHER): Payer: Medicaid Other | Admitting: Optometrist

## 2010-11-28 ENCOUNTER — Encounter (HOSPITAL_BASED_OUTPATIENT_CLINIC_OR_DEPARTMENT_OTHER): Payer: Self-pay | Admitting: Optometrist

## 2010-12-06 ENCOUNTER — Encounter (HOSPITAL_BASED_OUTPATIENT_CLINIC_OR_DEPARTMENT_OTHER): Payer: Self-pay | Admitting: Optometrist

## 2010-12-06 ENCOUNTER — Ambulatory Visit (INDEPENDENT_AMBULATORY_CARE_PROVIDER_SITE_OTHER): Payer: Medicaid Other | Admitting: Optometrist

## 2010-12-06 NOTE — Progress Notes (Signed)
ROS:   Is pain part of the reason you came to clinic today? No  Wears glasses: YES- second hand reading glasses  Wears contact lenses:NO  Eye symptoms: blurred vision  Review of systems is negative except for high cholesterol, mild asthma  The past medical, family and social history in the history section of the EMR has been confirmed with the patient: Yes

## 2010-12-07 NOTE — Progress Notes (Signed)
Hx: Vision a little blurry   Review of systems positive for asthma, elevated cholesterol  A: Presbyopia, minimal hyperopia  P: New Rx given for glasses

## 2010-12-19 ENCOUNTER — Other Ambulatory Visit (HOSPITAL_BASED_OUTPATIENT_CLINIC_OR_DEPARTMENT_OTHER): Payer: Self-pay | Admitting: Internal Medicine

## 2011-01-21 ENCOUNTER — Ambulatory Visit (HOSPITAL_BASED_OUTPATIENT_CLINIC_OR_DEPARTMENT_OTHER): Payer: Medicaid Other | Attending: Internal Medicine | Admitting: Nephrology

## 2011-01-21 VITALS — BP 133/87 | HR 56 | Temp 95.4°F | Resp 18 | Ht 67.0 in | Wt 243.4 lb

## 2011-01-21 DIAGNOSIS — E785 Hyperlipidemia, unspecified: Secondary | ICD-10-CM | POA: Insufficient documentation

## 2011-01-21 DIAGNOSIS — Z23 Encounter for immunization: Secondary | ICD-10-CM | POA: Insufficient documentation

## 2011-01-21 MED ORDER — ROSUVASTATIN CALCIUM 10 MG OR TABS
ORAL_TABLET | ORAL | Status: DC
Start: 2011-01-21 — End: 2011-07-01

## 2011-01-21 MED ORDER — FLUTICASONE PROPIONATE 50 MCG/ACT NA SUSP
NASAL | Status: DC
Start: 2011-01-21 — End: 2011-08-05

## 2011-01-21 MED ORDER — FLUTICASONE PROPIONATE HFA 110 MCG/ACT IN AERO
INHALATION_SPRAY | RESPIRATORY_TRACT | Status: DC
Start: 2011-01-21 — End: 2012-07-07

## 2011-01-21 MED ORDER — ASPIRIN 81 MG OR TBEC
DELAYED_RELEASE_TABLET | ORAL | Status: DC
Start: 2011-01-21 — End: 2012-02-09

## 2011-01-21 MED ORDER — ALBUTEROL SULFATE HFA 108 (90 BASE) MCG/ACT IN AERS
INHALATION_SPRAY | RESPIRATORY_TRACT | Status: DC
Start: 2011-01-21 — End: 2012-07-07

## 2011-01-21 MED ORDER — LORATADINE 10 MG OR TABS
ORAL_TABLET | ORAL | Status: DC
Start: 2011-01-21 — End: 2012-07-07

## 2011-01-21 NOTE — Progress Notes (Signed)
VACCINE SCREENING/ORDER WHEN CONTRAINDICATIONS PRESENT    Order date: 01/21/2011  Ordering provider: Dr. Overton Mam  Clinic stock used: YES   Interpreter used?: None  Vaccine information sheet(s) discussed, patient/parent/guardian verbalized understanding? YES   Vaccines given: Tdap    SCREENING: INACTIVATED VACCINES  NO 1. Do you have a high fever, severe cold-like symptoms or serious infection?  NO 2. Do you have any food allergies such as eggs, chicken, chicken feathers, chicken dander, or gelatin?  NO 3. Do you have any allergies to neomycin, streptomycin, or polymyxin?   NO 4. Have you had any severe reaction to a vaccine in the past such as a seizure, a convulsion from a high fever, or a paralysis problem called Guillain-Barre Syndrome? If so, which vaccine(s):     Pt tolerated well. No reaction.  If the patient/parent/or guardian answered "yes" to any of the above questions, consult with the provider to review the responses prior to administering vaccination. Parents with a contraindication to immunization will not receive the immunization without a specific physician order to vaccinate the patient and discussion of risk and benefits related to the contraindication.     Physician Order for Administration of Vaccine(s) When Contraindications Are Present  I have reviewed the existence in this patient's health history of possible contraindication(s) to administration of vaccine. The benefits to this patient outweigh the potential risks of immunization. Administer vaccine(s):  Nathan [name], 01/21/2011 3:54 PM

## 2011-01-21 NOTE — Progress Notes (Signed)
CC: est care    S/  States he is feeling pretty well today. Uses red inhaler as rescue for walking, which improves his sx. States allergies are improved, still taking loratadine and nasal spray.    Taking ASA and rosuvastatin.     O/  Filed Vitals:    01/21/11 1423   BP: 133/87   Pulse: 56   Temp: 95.4 F (35.2 C)   Resp: 18   Height: 5\' 7"  (1.702 m)   Weight: 243 lb 6.4 oz (110.406 kg)     GRAL: well-appearing AA man in NAD  CV: rrr  PULM: clear  ABD: benign      A/P    # Asthma:   Well controlled. Reinforced patient on use of inhalers.   -cont BID fluticasone and albuterol PRN.    # HLD:   Lipids within goal in Sept 2011 (LDL 96 and HDL 42). Cont current regimen on rosuvastatin.   -cont rosuvastatin 10mg  Qday     # Probable sleep apnea:   Declines sleep study again today. Will need to consider referral to sleep clinic when patient is interested.     HCM  -screening HbA1c ordered 7/12  -FOBT cards neg in 2012  -Tdap 7/'12  -DM screen today: A1c, BMP (given h/o unclear prev Cr elevation)  -has Rx for glasses      -------------------------------------------  Attending: Cathe Mons, MD  I discussed the history, exam and medical decision making for this patient with Dr. Overton Mam ERIC. I was present in the clinic during the provisions of these services with no other clinical duties. I concur with the management plan as noted above.  -------------------------------------------

## 2011-05-27 ENCOUNTER — Ambulatory Visit (HOSPITAL_BASED_OUTPATIENT_CLINIC_OR_DEPARTMENT_OTHER): Payer: 59 | Attending: Internal Medicine | Admitting: Nephrology

## 2011-05-27 ENCOUNTER — Encounter (HOSPITAL_BASED_OUTPATIENT_CLINIC_OR_DEPARTMENT_OTHER): Payer: Self-pay | Admitting: Nephrology

## 2011-05-27 VITALS — BP 132/82 | HR 87 | Temp 98.1°F | Ht 66.0 in | Wt 236.0 lb

## 2011-05-27 DIAGNOSIS — R609 Edema, unspecified: Secondary | ICD-10-CM | POA: Insufficient documentation

## 2011-05-27 DIAGNOSIS — M542 Cervicalgia: Secondary | ICD-10-CM | POA: Insufficient documentation

## 2011-05-27 DIAGNOSIS — M25579 Pain in unspecified ankle and joints of unspecified foot: Secondary | ICD-10-CM | POA: Insufficient documentation

## 2011-05-27 MED ORDER — NAPROXEN 250 MG OR TABS
ORAL_TABLET | ORAL | Status: DC
Start: 2011-05-27 — End: 2011-08-05

## 2011-05-27 NOTE — Progress Notes (Signed)
CC: f/u     S/  Nathan [name] states that Friday, he had a sharp R sided neck pain. He has a special texture memory pillow that works when head on, but his head occasionally falls off and it hurts.    Has had a flare up of his achilles. Swelling and pain. He wonders if this could be gout. States that he's had swelling for > 2-4 months, acute pain since Sunday (getting better overall). Thinks he had flares in other joints in his L & R feet over past few months.    Has been fishing, and when throwing his luer out, got numbness on his R forearm.     O/  Filed Vitals:    05/27/11 1530   BP: 132/82   Pulse: 87   Temp: 98.1 F (36.7 C)   TempSrc: Temporal   Height: 5\' 6"  (1.676 m)   Weight: 236 lb (107.049 kg)       BP Readings from Last 4 Encounters:   05/27/11 132/82   01/21/11 133/87   11/12/10 123/83   09/26/10 129/84       GRAL: well-appearing man in NAD  CV: RRR   PULM: ctab  SKIN: nonerythematous, symmetrically warm (not hot) feet/ankles bilat. Posterior fibrous tissue growth on heel, consistent with old achilles hear and incomplete tendinous union. 1+ pitting RLE edema.      A/P:    # RLE ankle edema  DDx chronic changes, gout, infection, tendinopathy. Pt's subacute presentation, and personal report of sx improving without tx over past 2 days is reassuring. History and h/o gout suggestive of such as possible Dx. Will want to be ginger about NSAID given CKD  (stg 2). Pt declines arthrocentesis today.  -uric acid level today  -naproxen  250mg  bid, max 7d course with flares  -gave pt strict return precautions including worsening sx, general concern  -would like to do diagnostic arthrocentesis if sx continue to establish more definitive dx.    # Neck pain  Sprain form pillow/sleeping mechanics.   -encouraged moderation/care with fad pillow usage.     FUTURE VISIT:  # forearm numbness    HCM  CV  -screening HbA1c pending (not completed 7/12 )  -lipids ordered  (11/12)  CA  -FOBT cards neg in 2012   OTHER  -Tdap 7/'12      -has Rx for glasses

## 2011-05-30 NOTE — Progress Notes (Signed)
-------------------------------------------    Attending: Hunner Garcon D Amylia Collazos, MD  I discussed the history, exam and medical decision making for this patient with Dr. BROOKENS, ANDREW ERIC. I was present in the clinic during the provisions of these services with no other clinical duties. I concur with the management plan as noted above.  -------------------------------------------

## 2011-06-03 ENCOUNTER — Ambulatory Visit (HOSPITAL_BASED_OUTPATIENT_CLINIC_OR_DEPARTMENT_OTHER)
Admit: 2011-06-03 | Discharge: 2011-06-03 | Disposition: A | Discharge status: Home / Self Care | Payer: 59 | Attending: Internal Medicine | Admitting: Internal Medicine

## 2011-06-03 DIAGNOSIS — M25579 Pain in unspecified ankle and joints of unspecified foot: Secondary | ICD-10-CM | POA: Insufficient documentation

## 2011-06-03 DIAGNOSIS — Z131 Encounter for screening for diabetes mellitus: Secondary | ICD-10-CM | POA: Insufficient documentation

## 2011-06-03 DIAGNOSIS — E785 Hyperlipidemia, unspecified: Secondary | ICD-10-CM | POA: Insufficient documentation

## 2011-06-03 LAB — LIPID PANEL
Cholesterol (LDL): 76 mg/dL (ref <130)
Cholesterol/HDL Ratio: 3.5
HDL Cholesterol: 37 mg/dL — ABNORMAL LOW (ref >40)
Non-HDL Cholesterol: 91 mg/dL (ref 0–159)
Total Cholesterol: 128 mg/dL (ref <200)
Triglyceride: 75 mg/dL (ref <150)

## 2011-06-03 LAB — URIC ACID, SERUM: Uric Acid: 8.7 mg/dL — ABNORMAL HIGH (ref 3.4–7.0)

## 2011-06-05 LAB — HEMOGLOBIN A1C, HPLC: Hemoglobin A1C: 5.9 % (ref 4.0–6.0)

## 2011-07-01 ENCOUNTER — Other Ambulatory Visit (HOSPITAL_BASED_OUTPATIENT_CLINIC_OR_DEPARTMENT_OTHER): Payer: Self-pay | Admitting: Pharmacist

## 2011-07-01 MED ORDER — ATORVASTATIN CALCIUM 40 MG OR TABS
ORAL_TABLET | ORAL | Status: DC
Start: 2011-07-01 — End: 2011-08-05

## 2011-07-01 NOTE — Progress Notes (Signed)
Pt's insurance now no longer covering rosuvastatin. Will change to atorvastatin 40mg  po qpm as substitute for rosuvastatin 10mg  daily  Pt will be informed at dispensing

## 2011-08-05 ENCOUNTER — Ambulatory Visit (HOSPITAL_BASED_OUTPATIENT_CLINIC_OR_DEPARTMENT_OTHER): Payer: 59 | Attending: Internal Medicine | Admitting: Nephrology

## 2011-08-05 ENCOUNTER — Encounter (HOSPITAL_BASED_OUTPATIENT_CLINIC_OR_DEPARTMENT_OTHER): Payer: Self-pay | Admitting: Nephrology

## 2011-08-05 VITALS — BP 119/71 | HR 53 | Temp 97.2°F | Resp 16 | Wt 245.0 lb

## 2011-08-05 DIAGNOSIS — L909 Atrophic disorder of skin, unspecified: Secondary | ICD-10-CM | POA: Insufficient documentation

## 2011-08-05 DIAGNOSIS — M109 Gout, unspecified: Secondary | ICD-10-CM | POA: Insufficient documentation

## 2011-08-05 DIAGNOSIS — L919 Hypertrophic disorder of the skin, unspecified: Secondary | ICD-10-CM | POA: Insufficient documentation

## 2011-08-05 DIAGNOSIS — R209 Unspecified disturbances of skin sensation: Secondary | ICD-10-CM | POA: Insufficient documentation

## 2011-08-05 MED ORDER — NAPROXEN 250 MG OR TABS
ORAL_TABLET | ORAL | Status: DC
Start: 2011-08-05 — End: 2013-07-18

## 2011-08-05 MED ORDER — FLUTICASONE PROPIONATE 50 MCG/ACT NA SUSP
NASAL | Status: DC
Start: 2011-08-05 — End: 2012-07-07

## 2011-08-05 MED ORDER — ATORVASTATIN CALCIUM 40 MG OR TABS
ORAL_TABLET | ORAL | Status: DC
Start: 2011-08-05 — End: 2012-07-07

## 2011-08-05 NOTE — Progress Notes (Signed)
CC: f/u     S/  Nathan [name] states he's doing pretty well, getting over a cold right now.     Denies gouty pain currently or recently.        O/  Filed Vitals:    08/05/11 1359   BP: 119/71   Pulse: 53   Temp: 97.2 F (36.2 C)   TempSrc: Temporal   Resp: 16   Weight: 245 lb (111.131 kg)       BP Readings from Last 4 Encounters:   08/05/11 119/71   05/27/11 132/82   01/21/11 133/87   11/12/10 123/83     Wt Readings from Last 6 Encounters:   08/05/11 245 lb (111.131 kg)   05/27/11 236 lb (107.049 kg)   01/21/11 243 lb 6.4 oz (110.406 kg)   11/12/10 237 lb 6.4 oz (107.684 kg)   09/26/10 241 lb 6.4 oz (109.498 kg)       GRAL: pleasant obese man in NAD  CV: rrr  PULM: clear  ABD: soft  SKIN: no rashes noted, multiple skin tags around neck and axillae  NEURO/MSK: no UE weakness; sensation largely intact over BUEs      A/P:    # Gouty preparation  -educated about diets  -naproxen PRN    # Skin tags, benign  -have offered to have pt back to burn or cut off    # R arm numbness  Now endorses h/o crutch use in 1990s after which time the sx seemed to start, wonder if there was compressive axillary injury. Unclear other etio, but no weakness is reassuring.  -will investigate if exercises to help by our next visit  -reassured, monitor for now      HCM  CV   -DM: HbA1c 5.9  -LDL 76  -no Tob  CA   -FOBT cards neg in 2012   OTHER   -Tdap 7/'12   -has Rx for glasses  -Uric acid 8.7 (11/12, stable from years prior; prior peak at 10.1)

## 2011-08-05 NOTE — Patient Instructions (Signed)
Toe inflammation or gout diet recommendations  A gout diet reduces your intake of foods that are high in purines, such as animal products, which helps control your body's production of uric acid. The diet also limits alcohol, particularly beer, which has been linked to gout attacks. If you're overweight or obese, lose weight. However, avoid fasting and rapid weight loss because these can promote a gout attack. Drink plenty of fluids to help flush uric acid from your body. Also avoid high-protein weight-loss diets, which can cause you to produce too much uric acid (hyperuricemia).   To follow the diet:   Limit meat (especially beef and pork meat!), poultry and fish. Animal proteins are high in purine. Avoid or severely limit high-purine foods, such as organ meats, herring, anchovies and mackerel. Red meat (beef, pork and lamb), fatty fish and seafood (tuna, shrimp, lobster and scallops) are associated with increased risk of gout. Because all meat, poultry and fish contain purines, limit your intake to 4 to 6 ounces (113 to 170 grams) daily.   Cut back on fat. Saturated fat lowers the body's ability to eliminate uric acid. Choosing plant-based protein, such as beans and legumes, and low-fat or fat-free dairy products will help you cut down the amount of saturated fat in your diet. High-fat meals also contribute to obesity, which is linked to gout.   Limit or avoid alcohol. Alcohol interferes with the elimination of uric acid from your body. Drinking beer, in particular, has been linked to gout attacks. If you're having an attack, avoid all alcohol. However, when you're not having an attack, drinking one or two 5-ounce (148-milliliter) servings a day of wine is not likely to increase your risk.   Limit or avoid foods sweetened with high-fructose corn syrup. Fructose is the only carbohydrate known to increase uric acid. It is best to avoid beverages sweetened with high-fructose corn syrup, such as soft drinks or juice  drinks. Juices that are 100 percent fruit juice do not seem to stimulate uric acid production as much.   Choose complex carbohydrates. Eat more whole grains and fruits and vegetables and fewer refined carbohydrates, such as white bread, cakes and candy.   Choose low-fat or fat-free dairy products. Some studies have shown that low-fat dairy products can help reduce the risk of gout.   Drink plenty of fluids, particularly water. Fluids can help remove uric acid from your body. Aim for 8 to 16 glasses a day. A glass is 8 ounces (237 milliliter). There's also some evidence that drinking four to six cups of coffee a day lowers gout risk in men.     From [employer]

## 2011-08-19 NOTE — Addendum Note (Signed)
Addended by: Russella Dar on: 08/19/2011 03:18 PM     Modules accepted: Level of Service

## 2011-08-19 NOTE — Progress Notes (Signed)
Attending: Rylann Munford H Lehman Whiteley, MD, MPH    I discussed this patient's history and physical findings with the resident, as well as the  plan for this patient. I have reviewed and confirm the findings and plan as documented  in the resident's note.

## 2011-12-30 ENCOUNTER — Encounter (HOSPITAL_BASED_OUTPATIENT_CLINIC_OR_DEPARTMENT_OTHER): Payer: 59 | Admitting: Nephrology

## 2012-01-01 ENCOUNTER — Encounter (HOSPITAL_BASED_OUTPATIENT_CLINIC_OR_DEPARTMENT_OTHER): Payer: Self-pay | Admitting: Nephrology

## 2012-01-01 ENCOUNTER — Ambulatory Visit (HOSPITAL_BASED_OUTPATIENT_CLINIC_OR_DEPARTMENT_OTHER): Payer: 59 | Attending: Internal Medicine | Admitting: Nephrology

## 2012-01-01 VITALS — BP 136/86 | HR 61 | Temp 96.1°F | Resp 18 | Ht 66.0 in | Wt 247.0 lb

## 2012-01-01 DIAGNOSIS — M109 Gout, unspecified: Secondary | ICD-10-CM | POA: Insufficient documentation

## 2012-01-01 LAB — BASIC METABOLIC PANEL
Anion Gap: 8 (ref 3–11)
Calcium: 9.1 mg/dL (ref 8.9–10.2)
Carbon Dioxide, Total: 26 mEq/L (ref 22–32)
Chloride: 101 mEq/L (ref 98–108)
Creatinine: 1.22 mg/dL — ABNORMAL HIGH (ref 0.51–1.18)
GFR, Calc, African American: >60 mL/min (ref >59)
GFR, Calc, European American: >60 mL/min (ref >59)
Glucose: 111 mg/dL (ref 62–125)
Potassium: 4.1 mEq/L (ref 3.7–5.2)
Sodium: 135 mEq/L — ABNORMAL LOW (ref 136–145)
Urea Nitrogen: 15 mg/dL (ref 8–21)

## 2012-01-01 MED ORDER — COLCHICINE 0.6 MG OR TABS
ORAL_TABLET | ORAL | Status: DC
Start: 2012-01-01 — End: 2013-06-24

## 2012-01-01 NOTE — Patient Instructions (Addendum)
Please bring all of your medications to your next appointment.    If you have questions or concerns about your health, call [phone].    FOR GOUT FLARES:  --TAKE 2 TABLETS OF NEW COLCHICINE MEDICINE THE FIRST DAY, THEN ONE TABLET A DAY FOR 7 DAYS TOTAL.  --if you have pain between flares, it is ok to take an occasional naproxen.

## 2012-01-01 NOTE — Progress Notes (Signed)
CC: f/u     S/  Mr Hartt states he's doing pretty well.     Re: gout, he's avoiding beef, but hard and still drinking sodas.     Re: obesity, starting to eat better. Trying diet sodas.     States his R ankle was recently inflamed, the L ball of his foot is currently inflamed.    Overall, even with gout, he's trying to avoid taking too many pills-- esp NSAIDs, because he knows he's been told in the past that they can be rough on the kidneys.      O/  Filed Vitals:    01/01/12 1355   BP: 136/86   Pulse: 61   Temp: 96.1 F (35.6 C)   TempSrc: Temporal   Resp: 18   Height: 5\' 6"  (1.676 m)   Weight: 247 lb (112.038 kg)       BP Readings from Last 4 Encounters:   01/01/12 136/86   08/05/11 119/71   05/27/11 132/82   01/21/11 133/87     Wt Readings from Last 6 Encounters:   01/01/12 247 lb (112.038 kg)   08/05/11 245 lb (111.131 kg)   05/27/11 236 lb (107.049 kg)   01/21/11 243 lb 6.4 oz (110.406 kg)   11/12/10 237 lb 6.4 oz (107.684 kg)   09/26/10 241 lb 6.4 oz (109.498 kg)       GRAL: pleasant obese man in NAD  CV: rrr  PULM: clear  ABD: soft  SKIN: erythema and ttp over ball of foot, 1st L MTP      A/P:    # Gout  -colchicine for gout flares  -educated about diets  -naproxen PRN  -BMP    # Obesity  Really trying on the diets (including less red meat), states he's not been exercising  -encouraged, also gym      HCM  CV   -DM: HbA1c 5.9  -LDL 76  -no Tob  CA   -FOBT cards neg in 2012   OTHER   -Tdap 7/'12   -has Rx for glasses  -Uric acid 8.7 (11/12, stable from years prior; prior peak at 10.1)

## 2012-01-05 NOTE — Progress Notes (Signed)
-------------------------------------------    Attending: Lenny Fiumara Owen Woodie Degraffenreid, MD  I discussed the history, exam and medical decision making for this patient with Dr. BROOKENS, ANDREW ERIC. I was present in the clinic during the provisions of these services with no other clinical duties. I concur with the management plan as noted above.  -------------------------------------------

## 2012-02-09 ENCOUNTER — Other Ambulatory Visit: Payer: Self-pay | Admitting: Pharmacist

## 2012-02-09 MED ORDER — ASPIRIN 81 MG OR TBEC
DELAYED_RELEASE_TABLET | ORAL | Status: DC
Start: 2012-02-09 — End: 2012-07-07

## 2012-07-07 ENCOUNTER — Ambulatory Visit (HOSPITAL_BASED_OUTPATIENT_CLINIC_OR_DEPARTMENT_OTHER): Payer: 59 | Attending: Internal Medicine | Admitting: Nephrology

## 2012-07-07 ENCOUNTER — Encounter (HOSPITAL_BASED_OUTPATIENT_CLINIC_OR_DEPARTMENT_OTHER): Payer: Self-pay | Admitting: Nephrology

## 2012-07-07 VITALS — BP 138/93 | HR 64 | Temp 97.5°F | Ht 66.0 in

## 2012-07-07 DIAGNOSIS — Z13220 Encounter for screening for lipoid disorders: Secondary | ICD-10-CM | POA: Insufficient documentation

## 2012-07-07 DIAGNOSIS — Z23 Encounter for immunization: Secondary | ICD-10-CM | POA: Insufficient documentation

## 2012-07-07 DIAGNOSIS — E669 Obesity, unspecified: Secondary | ICD-10-CM | POA: Insufficient documentation

## 2012-07-07 DIAGNOSIS — E785 Hyperlipidemia, unspecified: Secondary | ICD-10-CM | POA: Insufficient documentation

## 2012-07-07 DIAGNOSIS — Z139 Encounter for screening, unspecified: Secondary | ICD-10-CM | POA: Insufficient documentation

## 2012-07-07 DIAGNOSIS — Z131 Encounter for screening for diabetes mellitus: Secondary | ICD-10-CM | POA: Insufficient documentation

## 2012-07-07 DIAGNOSIS — B350 Tinea barbae and tinea capitis: Secondary | ICD-10-CM | POA: Insufficient documentation

## 2012-07-07 DIAGNOSIS — I499 Cardiac arrhythmia, unspecified: Secondary | ICD-10-CM | POA: Insufficient documentation

## 2012-07-07 LAB — LIPID PANEL
Cholesterol (LDL): 83 mg/dL (ref <130)
Cholesterol/HDL Ratio: 3.3
HDL Cholesterol: 43 mg/dL (ref >40)
Non-HDL Cholesterol: 100 mg/dL (ref 0–159)
Total Cholesterol: 143 mg/dL (ref <200)
Triglyceride: 85 mg/dL (ref <150)

## 2012-07-07 MED ORDER — FLUTICASONE PROPIONATE HFA 110 MCG/ACT IN AERO
INHALATION_SPRAY | RESPIRATORY_TRACT | Status: DC
Start: 2012-07-07 — End: 2013-07-18

## 2012-07-07 MED ORDER — FLUTICASONE PROPIONATE 50 MCG/ACT NA SUSP
NASAL | Status: DC
Start: 2012-07-07 — End: 2013-07-18

## 2012-07-07 MED ORDER — ATORVASTATIN CALCIUM 40 MG OR TABS
ORAL_TABLET | ORAL | Status: DC
Start: 2012-07-07 — End: 2013-07-18

## 2012-07-07 MED ORDER — KETOCONAZOLE 2 % EX SHAM
MEDICATED_SHAMPOO | CUTANEOUS | Status: DC
Start: 2012-07-07 — End: 2013-07-18

## 2012-07-07 MED ORDER — LORATADINE 10 MG OR TABS
ORAL_TABLET | ORAL | Status: DC
Start: 2012-07-07 — End: 2013-07-18

## 2012-07-07 MED ORDER — ASPIRIN 81 MG OR TBEC
DELAYED_RELEASE_TABLET | ORAL | Status: DC
Start: 2012-07-07 — End: 2013-07-18

## 2012-07-07 MED ORDER — ALBUTEROL SULFATE HFA 108 (90 BASE) MCG/ACT IN AERS
INHALATION_SPRAY | RESPIRATORY_TRACT | Status: DC
Start: 2012-07-07 — End: 2013-07-18

## 2012-07-07 NOTE — Progress Notes (Signed)
CC: f/u     S/  Nathan [name] states he's doing pretty well. Had a brief episode of numbness lately, self-resolved. He saw a health TV episode and wondered after if this could relate to undiagnosed diabetes.     Re: screening tests, was checked out for HIV in the 90s.     Isn't walking as much as he wants to lately, esp. Since salmon season ended.     Running low on ketoconazle shampoo, states his face has been more flaky lately, needing vaseline.     Last gout flare several months back.     O/  Filed Vitals:    07/07/12 1338   BP: 138/93   Pulse: 64   Temp: 97.5 F (36.4 C)   TempSrc: Temporal   Height: 5\' 6"  (1.676 m)       BP Readings from Last 4 Encounters:   07/07/12 138/93   01/01/12 136/86   08/05/11 119/71   05/27/11 132/82     Wt Readings from Last 6 Encounters:   01/01/12 247 lb (112.038 kg)   08/05/11 245 lb (111.131 kg)   05/27/11 236 lb (107.049 kg)   01/21/11 243 lb 6.4 oz (110.406 kg)   11/12/10 237 lb 6.4 oz (107.684 kg)   09/26/10 241 lb 6.4 oz (109.498 kg)       GRAL: pleasant obese man in NAD  CV: irrefularly irreg hr    A/P:    # Irregularly irreg HR: either PVCs (more likely) or AF. Pt declines EKG today, wants to go home and sleep. Denies cp & palps, SOB. Strongly encouraged pt to come back for EKG specifically at our next visit.     # Obesity  Really trying on the diets (including less red meat), states he's not been exercising  -encouraged, also gym    # Screening today: DM, HLD, HIV, HCV    # tinea capitis: refilled ketocon    DEFERRED:   # Gout: colchicine for gout flares, educated about diets, naproxen PRN      HCM  CV   -DM: HbA1c 5.9, rechecking 12/13  -LDL 76, on atorva 40  -no Tob  CA   -FOBT cards neg in 2012   OTHER   -Tdap 7/'12, Flu 12/13  -has Rx for glasses  -Uric acid 8.7 (11/12, stable from years prior; prior peak at 10.1)

## 2012-07-07 NOTE — Progress Notes (Signed)
VACCINE SCREENING/ORDER WHEN CONTRAINDICATIONS PRESENT    Order date: 07/07/2012  Ordering provider: Kathleen Argue  Clinic stock used: YES   Interpreter used?: None  Vaccine information sheet(s) discussed, patient/parent/guardian verbalized understanding? YES   Vaccines given: INFLUENZA  VIS given 07/07/2012 by Crecencio Mc MA.      SCREENING: INACTIVATED VACCINES  NO 1. Do you have a high fever, severe cold-like symptoms or serious infection?  NO 2. Do you have any food allergies such as eggs, chicken, chicken feathers, chicken dander, or gelatin?  NO 3. Do you have any allergies to neomycin, streptomycin, or polymyxin?   YES 4. Have you had any severe reaction to a vaccine in the past such as a seizure, a convulsion from a high fever, or a paralysis problem called Guillain-Barre Syndrome? If so, which vaccine(s):    NO 5. QUESTIONS FOR WOMEN: Are you pregnant or planning on becoming pregnant in the next month?    If the patient/parent/or guardian answered "yes" to any of the above questions, consult with the provider to review the responses prior to administering vaccination. Parents with a contraindication to immunization will not receive the immunization without a specific physician order to vaccinate the patient and discussion of risk and benefits related to the contraindication.     Physician Order for Administration of Vaccine(s) When Contraindications Are Present  I have reviewed the existence in this patient's health history of possible contraindication(s) to administration of vaccine. The benefits to this patient outweigh the potential risks of immunization. Administer vaccine(s):  Harrell Gave Phung, 07/07/2012 3:39 PM MA.

## 2012-07-08 LAB — HIV ANTIGEN AND ANTIBODY SCRN
HIV Antigen and Antibody Interpretation: NONREACTIVE
HIV Antigen and Antibody Result: NONREACTIVE

## 2012-07-08 LAB — HEPATITIS C AB WITH REFLEX PCR: Hepatitis C Antibody w/Rflx PCR: NONREACTIVE

## 2012-07-08 LAB — HEMOGLOBIN A1C, HPLC: Hemoglobin A1C: 6.3 % — ABNORMAL HIGH (ref 4.0–6.0)

## 2012-07-10 NOTE — Progress Notes (Signed)
Attending Statement     I discussed the patient's history, physical findings and plan with Andrew Brookens, MD. I have reviewed and confirm the findings and plan as documented in this note.     Sherry Rogus W. Jerrie Schussler, MD, MPH   Attending Physician

## 2012-08-24 NOTE — Addendum Note (Signed)
Addended by: Overton Mam ERIC on: 08/24/2012 12:34 PM     Modules accepted: Orders

## 2012-10-05 ENCOUNTER — Encounter (HOSPITAL_BASED_OUTPATIENT_CLINIC_OR_DEPARTMENT_OTHER): Payer: Self-pay | Admitting: Nephrology

## 2012-10-05 ENCOUNTER — Other Ambulatory Visit (HOSPITAL_BASED_OUTPATIENT_CLINIC_OR_DEPARTMENT_OTHER): Payer: Self-pay | Admitting: Nephrology

## 2012-10-05 ENCOUNTER — Ambulatory Visit (HOSPITAL_BASED_OUTPATIENT_CLINIC_OR_DEPARTMENT_OTHER): Payer: 59 | Attending: Nephrology | Admitting: Nephrology

## 2012-10-05 VITALS — BP 122/77 | HR 58 | Temp 97.2°F | Ht 66.0 in | Wt 247.0 lb

## 2012-10-05 DIAGNOSIS — I499 Cardiac arrhythmia, unspecified: Secondary | ICD-10-CM | POA: Insufficient documentation

## 2012-10-05 NOTE — Progress Notes (Signed)
CC: f/u     S/  Mr Carranza states he's doing pretty well.     He's had some R sided abd pain, but states he's always leaning on a wooden bar on his R side while at home. When he gets out to walk around, it feels better. Isn't walking as much as he wants to lately, esp. Since fishing season ended. During fishing season, he thinks he can't walk as far as he used to, but this recall is of remote sx.     Still has occasional numbness in feet. A1c checked in Dec = 6.3. HIV, HCV neg. Lipids wnl.     Denies f, ch, sob, cp, abd pain, flank pain, LE edema.     Last gout flare several months back.     O/  Filed Vitals:    10/05/12 1352   BP: 122/77   Pulse: 58   Temp: 97.2 F (36.2 C)   TempSrc: Temporal   Height: 5\' 6"  (1.676 m)   Weight: 247 lb (112.038 kg)       BP Readings from Last 4 Encounters:   10/05/12 122/77   07/07/12 138/93   01/01/12 136/86   08/05/11 119/71     Wt Readings from Last 6 Encounters:   10/05/12 247 lb (112.038 kg)   01/01/12 247 lb (112.038 kg)   08/05/11 245 lb (111.131 kg)   05/27/11 236 lb (107.049 kg)   01/21/11 243 lb 6.4 oz (110.406 kg)   11/12/10 237 lb 6.4 oz (107.684 kg)       GRAL: pleasant obese man in NAD  CV: regular rate in 60s with occas premature beats; PMI nondisplaced, tr BLE pitting edema  PULM: ctab  ABD: obese, soft nt/nd +bs    EKG: NSR, LAFB, mild LVH, no PVCs caught on strip    A/P:    # Premature beats: pt in NSR, likely with occas. PVCs. Overall reassuring picture.   -EKG today  -reassurance    # LE edema and possible DOE: subtle sx, pt out of shape. EKG showing some LVH.   -monitor sx  -encourage wt loss--> continue to monitor wts  -leg elevation    # Obesity: states he's still not been exercising enough  -encouraged diet (including less red meat), exercise    # tinea capitis: ketoconazole shampoo    DEFERRED:   # Gout: colchicine for gout flares, educated about diets, naproxen PRN      HCM  CV   -DM: HbA1c 6.3,   -LDL 83, on atorva 40  -no Tob  CA   -FOBT cards neg in 2012    OTHER   -Tdap 7/'12, Flu 12/13  -has Rx for glasses  -Uric acid 8.7 (11/12, stable from years prior; prior peak at 10.1)

## 2012-10-08 ENCOUNTER — Ambulatory Visit (HOSPITAL_BASED_OUTPATIENT_CLINIC_OR_DEPARTMENT_OTHER)
Admit: 2012-10-08 | Discharge: 2012-10-08 | Disposition: A | Discharge status: Home / Self Care | Payer: 59 | Attending: Internal Medicine | Admitting: Internal Medicine

## 2012-10-08 LAB — BASIC METABOLIC PANEL
Anion Gap: 11 (ref 3–11)
Calcium: 9 mg/dL (ref 8.9–10.2)
Carbon Dioxide, Total: 24 mEq/L (ref 22–32)
Chloride: 103 mEq/L (ref 98–108)
Creatinine: 1.17 mg/dL (ref 0.51–1.18)
GFR, Calc, African American: >60 mL/min (ref >59)
GFR, Calc, European American: >60 mL/min (ref >59)
Glucose: 108 mg/dL (ref 62–125)
Potassium: 4 mEq/L (ref 3.7–5.2)
Sodium: 138 mEq/L (ref 136–145)
Urea Nitrogen: 12 mg/dL (ref 8–21)

## 2013-04-19 ENCOUNTER — Encounter (HOSPITAL_BASED_OUTPATIENT_CLINIC_OR_DEPARTMENT_OTHER): Payer: Self-pay | Admitting: Internal Medicine

## 2013-04-19 ENCOUNTER — Ambulatory Visit (HOSPITAL_BASED_OUTPATIENT_CLINIC_OR_DEPARTMENT_OTHER): Payer: 59

## 2013-04-19 ENCOUNTER — Ambulatory Visit (HOSPITAL_BASED_OUTPATIENT_CLINIC_OR_DEPARTMENT_OTHER): Payer: 59 | Attending: Internal Medicine | Admitting: Internal Medicine

## 2013-04-19 VITALS — BP 117/65 | HR 45 | Temp 97.3°F | Resp 20 | Ht 66.0 in | Wt 228.6 lb

## 2013-04-19 DIAGNOSIS — Z0000 Encounter for general adult medical examination without abnormal findings: Secondary | ICD-10-CM | POA: Insufficient documentation

## 2013-04-19 DIAGNOSIS — Z23 Encounter for immunization: Secondary | ICD-10-CM | POA: Insufficient documentation

## 2013-04-19 NOTE — Patient Instructions (Signed)
Please bring all of your medications to your next appointment.    If you have questions or concerns about your health, call [phone].

## 2013-04-19 NOTE — Progress Notes (Addendum)
[facility] - Outpatient Return Clinic Note    DATE: 04/19/2013     ID/CHIEF COMPLAINT______________________________  Nathan [name] is a 62 year old male who is here today to re-establish care. His PMH is significant for HLD, obesity, asthma and gout. He previously saw Dr. Gaspar Cola. Their last appointment was in March 2014.    He is feeling well. Using albuterol inhaler very infrequently. No gout flairs. Following diet recommendations to avoid red meat and alcohol to reduce the chance of flair. Has lost some weight. Doing a lot of walking as part of his commute to go fishing.    Needles in bilateral feet. Thinks it may be related to diabetes. Knows it would be better to stay away from sugary juice.    He shared that someone recommended he get a sleep study, but that he does not want to get it.     REVIEW OF SYSTEMS__________________________  No chest pain, no PND,   No shortness of breath  No N/V/D/C      PHYSICAL EXAM_______________________________  There were no vitals taken for this visit.  Wt Readings from Last 6 Encounters:   10/05/12 247 lb (112.038 kg)   01/01/12 247 lb (112.038 kg)   08/05/11 245 lb (111.131 kg)   05/27/11 236 lb (107.049 kg)   01/21/11 243 lb 6.4 oz (110.406 kg)   11/12/10 237 lb 6.4 oz (107.684 kg)     BP Readings from Last 6 Encounters:   10/05/12 122/77   07/07/12 138/93   01/01/12 136/86   08/05/11 119/71   05/27/11 132/82   01/21/11 133/87     BP 117/65  Pulse 45  Temp(Src) 97.3 F (36.3 C) (Temporal)  Resp 20  Ht 5\' 6"  (1.676 m)  Wt 228 lb 9.6 oz (103.692 kg)  BMI 36.91 kg/m2  GRAL: appears stated age. no acute distress  Extremities: BLE trace pitting edema  Psych: flat affect, responses are tangential at times    FAMILY HISTORY:  Mom died of cancer (he doesn't recall what type), had diabetes; Father died but he doesn't remember the cause     SOCIAL HISTORY/Habits/Exposures________________  Previously smoked. Few cigarettes a day starting 62 year of age to late  teens  No Etoh   No drug  H/o incarceration    ASSESSMENT AND PLAN_____________________    HEALTH CARE MAINTENANCE  Today FOBT ordered (had negative FOBT 11/2010), encouraged Influenza vaccine & HA1C ordered (last HA1C 6.3 06/2012)  Other health care maintenance-related history:  Cardiovascular   On statin   On asa  Infectious disease   HCV nonreactive 06/2012   HIV nonreactive12/2013  Immunizations   Tdap 01/2011   pneumococccal 23 01/2009   Hep B 04/2008    DEFERRED (problems below appeared in previous note by Dr. Gaspar Cola)  # Premature beats: pt in NSR, likely with occas. PVCs. Overall reassuring picture.   # LE edema and possible DOE: subtle sx, pt out of shape. EKG showing some LVH.   -monitor sx   -encourage wt loss--> continue to monitor wts   -leg elevation   # Obesity: states he's still not been exercising enough   -encouraged diet (including less red meat), exercise   # tinea capitis: ketoconazole shampoo :   # Gout: colchicine for gout flares, educated about diets, naproxen PRN      SUPPLEMENTAL INFO:    PROBLEM LIST:  Patient Active Problem List    Diagnosis Date Noted   . Irregular heart beat  10/05/2012   . Skin tag 08/05/2011   . Gout 05/27/2011   . Ankle pain, R  05/27/2011   . OBESITY NOS 11/24/2008   . ASTHMA NOS W STATUS ASTHMATICUS 11/24/2008   . GENERAL OSTEOARTHROSIS 11/24/2008   . HYPERLIPIDEMIA NEC/NOS 11/24/2008   . 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 PRESBYOPIA 11/24/2008     SOCIAL HISTORY:    MEDICATIONS:  Current Outpatient Prescriptions   Medication Sig   . Albuterol Sulfate HFA 108 (90 BASE) MCG/ACT Inhalation Aero Soln Inhale 2 puffs by mouth every 6 hours if needed for shortness of breath   . Aspirin 81 MG Oral Tab EC Take 1 tablet by mouth once daily   . Atorvastatin Calcium 40 MG Oral Tab Take one tablet by mouth once daily   . Colchicine 0.6 MG Oral Tab 2 tablets with gout flare the first day, then 1 tablet a day for 7 days   . Fluticasone Propionate  HFA 110 MCG/ACT Inhalation  Aerosol Inhale 2 puffs by mouth every 12 hours. Use with spacer chamber.   . Fluticasone Propionate 50 MCG/ACT Nasal Suspension Spray 2 times into each nostril daily   . Ketoconazole 2 % External Shampoo lather daily   . Loratadine 10 MG Oral Tab 1 each day for allergy   . Naproxen 250 MG Oral Tab Take 1 tablet by mouth 2 times a day for a flare of foot pain. Do not take longer than 7 days. Take with water/food.     No current facility-administered medications for this visit.     ALLERGIES:  Review of patient's allergies indicates:  No Known Allergies

## 2013-04-19 NOTE — Progress Notes (Signed)
VACCINE SCREENING QUESTIONARRE    INACTIVATED INFLUENZA VACCINE  NO 1. Have you ever had an allergic reaction to eggs, chicken, chicken feathers, chicken dander or previous dose of influenza vaccine?  NO 2. Have you ever had Guillain-Barre syndrome or any form of paralysis after influenza administration?  NO 3. Do you presently have a fever or are you experiencing severe cold-like symptoms or symptoms of any serious infection?  NO 4. Have you ever had febrile convulsions?  N/A 5. QUESTIONS FOR WOMEN: Is there any chance you could be pregnant?       If the patient/parent/or guardian answered "yes" to any of the above questions, direct patients to their primary care provider to review the responses rather than administering vaccination.      Interpreter used?: No  Vaccine information sheet(s) discussed, patient/parent/guardian verbalized understanding? YES      Date VIS given: 04/19/2013  Vaccines given: flu      Client did tolerate injections, and remained on campus for 20 minutes for observation.

## 2013-04-19 NOTE — Progress Notes (Deleted)
[facility] - Outpatient Return Clinic Note    DATE: 04/19/2013     ID/CHIEF COMPLAINT_____________________________________________________     Nathan [name] is a 62 year old male who is here today to re-establish care. His PMH is significant for HLD, obesity, asthma and gout. He previously saw Dr. Gaspar Cola. THeir last appointment was in     Influenza remind him    Assessment/Plan  -FIT testing       INTERVAL HISTORY/HPI___________________________________________________  ***    PHYSICAL EXAM:  There were no vitals taken for this visit.  Wt Readings from Last 6 Encounters:   10/05/12 247 lb (112.038 kg)   01/01/12 247 lb (112.038 kg)   08/05/11 245 lb (111.131 kg)   05/27/11 236 lb (107.049 kg)   01/21/11 243 lb 6.4 oz (110.406 kg)   11/12/10 237 lb 6.4 oz (107.684 kg)     BP Readings from Last 6 Encounters:   10/05/12 122/77   07/07/12 138/93   01/01/12 136/86   08/05/11 119/71   05/27/11 132/82   01/21/11 133/87     ***    ASSESSMENT AND PLAN:  ***    Diabetes HA1C 6.3 06/2012  Health care maintenance  Cancer screening   Colon   Lung smoker?  Cardiovascular   HTN   AAA  Endocrine   Diabetes   Hyperlipidemia   Osteoporosis  Infectious disease   HCV nonreactive 06/2012   HIV nonreactive12/2013   Chlamydia   Gonorrhea   Syphilis  Social, mental health and substance use   Depression   etoh   Tobacco  Immunizations        SUPPLEMENTAL INFO:    PROBLEM LIST:  Patient Active Problem List    Diagnosis Date Noted   . Irregular heart beat 10/05/2012   . Skin tag 08/05/2011   . Gout 05/27/2011   . Ankle pain, R  05/27/2011   . OBESITY NOS 11/24/2008   . ASTHMA NOS W STATUS ASTHMATICUS 11/24/2008   . GENERAL OSTEOARTHROSIS 11/24/2008   . HYPERLIPIDEMIA NEC/NOS 11/24/2008   . 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 PRESBYOPIA 11/24/2008     SOCIAL HISTORY:  History     Social History Narrative   . No narrative on file     MEDICATIONS:  Current Outpatient Prescriptions   Medication Sig   . Albuterol Sulfate HFA 108  (90 BASE) MCG/ACT Inhalation Aero Soln Inhale 2 puffs by mouth every 6 hours if needed for shortness of breath   . Aspirin 81 MG Oral Tab EC Take 1 tablet by mouth once daily   . Atorvastatin Calcium 40 MG Oral Tab Take one tablet by mouth once daily   . Colchicine 0.6 MG Oral Tab 2 tablets with gout flare the first day, then 1 tablet a day for 7 days   . Fluticasone Propionate  HFA 110 MCG/ACT Inhalation Aerosol Inhale 2 puffs by mouth every 12 hours. Use with spacer chamber.   . Fluticasone Propionate 50 MCG/ACT Nasal Suspension Spray 2 times into each nostril daily   . Ketoconazole 2 % External Shampoo lather daily   . Loratadine 10 MG Oral Tab 1 each day for allergy   . Naproxen 250 MG Oral Tab Take 1 tablet by mouth 2 times a day for a flare of foot pain. Do not take longer than 7 days. Take with water/food.     No current facility-administered medications for this visit.     ALLERGIES:  Review of patient's allergies  indicates:  No Known Allergies

## 2013-04-20 LAB — HEMOGLOBIN A1C, HPLC: Hemoglobin A1C: 6.2 — ABNORMAL HIGH (ref 4.0–6.0)

## 2013-04-29 NOTE — Progress Notes (Signed)
Attending: Divinity Kyler Lisle Ulys Favia, MD  I saw and evaluated the patient with this resident.  I repeated the essential components of the history and exam.  I discussed the case with the resident, and have reviewed the resident's note. I agree  with the findings and plan as documented in the resident's note.

## 2013-04-29 NOTE — Addendum Note (Signed)
Addended by: Lacie Scotts on: 04/29/2013 03:09 PM     Modules accepted: Level of Service, SmartSet

## 2013-06-24 ENCOUNTER — Other Ambulatory Visit: Payer: Self-pay | Admitting: Nephrology

## 2013-06-24 NOTE — Telephone Encounter (Signed)
The patient last received this medication at the requesting pharmacy 12/27/12

## 2013-06-27 MED ORDER — COLCHICINE 0.6 MG OR TABS
ORAL_TABLET | ORAL | Status: DC
Start: 2013-06-24 — End: 2013-07-18

## 2013-07-18 ENCOUNTER — Encounter (HOSPITAL_BASED_OUTPATIENT_CLINIC_OR_DEPARTMENT_OTHER): Payer: Self-pay | Admitting: Internal Medicine

## 2013-07-18 ENCOUNTER — Ambulatory Visit (HOSPITAL_BASED_OUTPATIENT_CLINIC_OR_DEPARTMENT_OTHER): Payer: 59 | Attending: Internal Medicine | Admitting: Internal Medicine

## 2013-07-18 VITALS — BP 127/87 | HR 68 | Temp 97.5°F | Ht 66.0 in | Wt 241.0 lb

## 2013-07-18 DIAGNOSIS — Z0000 Encounter for general adult medical examination without abnormal findings: Secondary | ICD-10-CM | POA: Insufficient documentation

## 2013-07-18 DIAGNOSIS — E785 Hyperlipidemia, unspecified: Secondary | ICD-10-CM | POA: Insufficient documentation

## 2013-07-18 DIAGNOSIS — E119 Type 2 diabetes mellitus without complications: Secondary | ICD-10-CM | POA: Insufficient documentation

## 2013-07-18 DIAGNOSIS — J309 Allergic rhinitis, unspecified: Secondary | ICD-10-CM | POA: Insufficient documentation

## 2013-07-18 DIAGNOSIS — J45902 Unspecified asthma with status asthmaticus: Secondary | ICD-10-CM | POA: Insufficient documentation

## 2013-07-18 DIAGNOSIS — Z760 Encounter for issue of repeat prescription: Secondary | ICD-10-CM | POA: Insufficient documentation

## 2013-07-18 DIAGNOSIS — B350 Tinea barbae and tinea capitis: Secondary | ICD-10-CM | POA: Insufficient documentation

## 2013-07-18 MED ORDER — ATORVASTATIN CALCIUM 40 MG OR TABS
40.0000 mg | ORAL_TABLET | Freq: Every day | ORAL | Status: DC
Start: 2013-07-18 — End: 2014-07-24

## 2013-07-18 MED ORDER — KETOCONAZOLE 2 % EX SHAM
MEDICATED_SHAMPOO | CUTANEOUS | Status: DC
Start: 2013-07-18 — End: 2015-05-01

## 2013-07-18 MED ORDER — COLCHICINE 0.6 MG OR TABS
ORAL_TABLET | ORAL | Status: DC
Start: 2013-07-18 — End: 2013-09-14

## 2013-07-18 MED ORDER — FLUTICASONE PROPIONATE HFA 110 MCG/ACT IN AERO
2.0000 | INHALATION_SPRAY | Freq: Two times a day (BID) | RESPIRATORY_TRACT | Status: DC
Start: 2013-07-18 — End: 2015-05-01

## 2013-07-18 MED ORDER — ASPIRIN 81 MG OR TBEC
81.0000 mg | DELAYED_RELEASE_TABLET | Freq: Every day | ORAL | Status: DC
Start: 2013-07-18 — End: 2014-08-01

## 2013-07-18 MED ORDER — LORATADINE 10 MG OR TABS
ORAL_TABLET | ORAL | Status: DC
Start: 2013-07-18 — End: 2014-10-22

## 2013-07-18 MED ORDER — ALBUTEROL SULFATE HFA 108 (90 BASE) MCG/ACT IN AERS
2.0000 | INHALATION_SPRAY | Freq: Four times a day (QID) | RESPIRATORY_TRACT | Status: DC | PRN
Start: 2013-07-18 — End: 2015-05-01

## 2013-07-18 MED ORDER — FLUTICASONE PROPIONATE 50 MCG/ACT NA SUSP
2.0000 | Freq: Every day | NASAL | Status: DC
Start: 2013-07-18 — End: 2015-05-01

## 2013-07-18 NOTE — Progress Notes (Addendum)
[facility] - Outpatient Return Clinic Note    DATE: 07/18/2013     ID/CHIEF COMPLAINT: Nathan [name] is a 62 year old male who is here today for Follow-Up , Itchy Eye and Refill Request    His PMH is significant for HLD, obesity, asthma and gout. Last seen 03/2013.    INTERVAL HISTORY/HPI:  Getting over a cold; had rhinorrhea,sneezing and eyes watering couples weeks ago. Also had chills in middle of the month. Now with dry cough but it's getting better. Throat isn't sore. He did get flu shot. No PND. No shortness of breath.     Has not been using fluticasone or albuterol inhalers. Uses it in the spring when allergies act up.    Required gout meds for flairs in right Achilles tendon and left ankle. Believes ground beef to his diet precipitated the flairs.    Weight is up, but he thinks it will go down as fishing season starts up again. Also making changes to his diet. Has been eating more, sandwiches, oatmeal, canned soup and boiled and baked chicken. Likes pastries. Trying to have less soda, but has been drinking Dr. Reino Kent. He's planning on starting do more walking.      Family and social histories/Habits/Exposures:  Mom died of cancer (he doesn't recall what type), had diabetes; Father died but he doesn't remember the cause   Previously smoked. Few cigarettes a day starting 62 year of age to late teens   No Etoh   No drug   H/o incarceration  Smoked and drank etoh in the distant past. Smoked from 49 to 81 years of age. Last drink at [age] years of age. Feels adamant about never using these substances again.      PHYSICAL EXAM:  BP 127/87  Pulse 68  Temp(Src) 97.5 F (36.4 C) (Temporal)  Ht 5\' 6"  (1.676 m)  Wt 241 lb (109.317 kg)  BMI 38.92 kg/m2  Wt Readings from Last 6 Encounters:   07/18/13 241 lb (109.317 kg)   04/19/13 228 lb 9.6 oz (103.692 kg)   10/05/12 247 lb (112.038 kg)   01/01/12 247 lb (112.038 kg)   08/05/11 245 lb (111.131 kg)   05/27/11 236 lb (107.049 kg)     BP Readings from  Last 6 Encounters:   07/18/13 127/87   04/19/13 117/65   10/05/12 122/77   07/07/12 138/93   01/01/12 136/86   08/05/11 119/71     BP 127/87  Pulse 68  Temp(Src) 97.5 F (36.4 C) (Temporal)  Ht 5\' 6"  (1.676 m)  Wt 241 lb (109.317 kg)  BMI 38.92 kg/m2  HEENT: conjunctival injection, difficult to visualize pharynx  Lungs: no wheezes, CTAB  Cardiovascular: RRR, no lower extremity edema    HA1C 6.2 (03/2013); 6.3 in 06/2012    ASSESSMENT AND PLAN:    # Obesity  - discussed diet and exercise   - referred him to see nutritionist    # Pre-Diabetes 06/2013 HA1C 6.0 (6.2 <- 6.3 <- 5.9 05/2011)  - discussed diet and exercise   - referred him to see nutritionist  - can consider starting metformin    # Gout  - colchicine for gout flares  - discussed foods that can precipitate flairs  - naproxen PRN  - can discuss new colchicine dosing at next visit    # Health care maintenance    Cardiovascular  (TC 143 LDL 83 06/2012)  ASCVD Risk Estimator: if Diabetes Y 10-yr risk 14.6%  if  Diabetes N 10-yr risk 8% (6.2% w optimal RFs)  On atorva 40 (moderate)  On asa   Cancer screening  negative FOBT 11/2010  Infectious disease   HCV nonreactive 06/2012   HIV nonreactive12/2013   Immunizations   States he had flu vaccine  Tdap 01/2011   pneumococccal 23 01/2009   Hep B 04/2008    To do at next visit:  Discuss new lower dosing of colchicine for gout flair.   Per uptodate  "Initial: 1.2 mg at the first sign of flare, followed in 1 hour with a single dose of 0.6 mg (maximum: 1.8 mg within 1 hour). Patients receiving prophylaxis therapy may receive treatment dosing; wait 12 hours before resuming prophylaxis dose"    Patient discussed with Dr. Renette Butters.  RTC in 3 months    SUPPLEMENTAL INFO:    PROBLEM LIST:  Patient Active Problem List    Diagnosis Date Noted   . Irregular heart beat 10/05/2012   . Skin tag 08/05/2011   . Gout 05/27/2011   . Ankle pain, R  05/27/2011   . OBESITY NOS 11/24/2008   . ASTHMA NOS W STATUS ASTHMATICUS 11/24/2008    . GENERAL OSTEOARTHROSIS 11/24/2008   . HYPERLIPIDEMIA NEC/NOS 11/24/2008   . 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 Albuterol Sulfate HFA 108 (90 BASE) MCG/ACT Inhalation Aero Soln Inhale 2 puffs by mouth every 6 hours if needed for shortness of breath   . Aspirin 81 MG Oral Tab EC Take 1 tablet by mouth once daily   . Atorvastatin Calcium 40 MG Oral Tab Take one tablet by mouth once daily   . Colchicine 0.6 MG Oral Tab Take 2 tablets by mouth with gout flare the first day, then 1 tablet a day for 7 days   . Fluticasone Propionate  HFA 110 MCG/ACT Inhalation Aerosol Inhale 2 puffs by mouth every 12 hours. Use with spacer chamber.   . Fluticasone Propionate 50 MCG/ACT Nasal Suspension Spray 2 times into each nostril daily   . Ketoconazole 2 % External Shampoo lather daily   . Loratadine 10 MG Oral Tab 1 each day for allergy   . Naproxen 250 MG Oral Tab Take 1 tablet by mouth 2 times a day for a flare of foot pain. Do not take longer than 7 days. Take with water/food.     No current facility-administered medications for this visit.     ALLERGIES:  Review of patient's allergies indicates:  No Known Allergies    Attending Note  I discussed this patient's history and physical findings with Madalyn Rob, MD, as well as the plan for this patient. I have reviewed and confirm the findings and plan as documented in this note.

## 2013-07-19 LAB — HEMOGLOBIN A1C, HPLC: Hemoglobin A1C: 6 % (ref 4.0–6.0)

## 2013-08-22 ENCOUNTER — Ambulatory Visit (HOSPITAL_BASED_OUTPATIENT_CLINIC_OR_DEPARTMENT_OTHER): Payer: 59 | Admitting: Registered"

## 2013-09-14 ENCOUNTER — Ambulatory Visit (HOSPITAL_BASED_OUTPATIENT_CLINIC_OR_DEPARTMENT_OTHER): Payer: 59 | Attending: Internal Medicine | Admitting: Internal Medicine

## 2013-09-14 ENCOUNTER — Encounter (HOSPITAL_BASED_OUTPATIENT_CLINIC_OR_DEPARTMENT_OTHER): Payer: Self-pay | Admitting: Internal Medicine

## 2013-09-14 VITALS — BP 145/96 | HR 64 | Temp 97.9°F | Resp 16 | Ht 66.0 in | Wt 249.2 lb

## 2013-09-14 DIAGNOSIS — M109 Gout, unspecified: Secondary | ICD-10-CM | POA: Insufficient documentation

## 2013-09-14 NOTE — Progress Notes (Signed)
[facility] - Outpatient Return Clinic Note    DATE: 09/14/2013     ID/CHIEF COMPLAINT: Nathan [name] is a 63 year old male who is here today for URI; and Gout    Patient Active Problem List   Diagnosis   . OBESITY NOS   . ASTHMA NOS W STATUS ASTHMATICUS   . GENERAL OSTEOARTHROSIS   . HYPERLIPIDEMIA NEC/NOS   . COMPLIC-URINARY TRACT   . PRESBYOPIA   . Gout   . Ankle pain, R    . Skin tag   . Irregular heart beat       INTERVAL HISTORY/HPI:  When I last saw him in late December I wrote a to do note to myself to discuss new lower dosing of colchicine. Earlier this month around February 1st or 2nd took just 2 colchicine and used ice and he was able to stay on top of the flair.    Cough, which started yesterday coughing up yellow phlegm and wheezing. Slight headache and eyes are bugging him.  Sates his breathing is ok. Has his fluticasone inhaler n his pocket. He uses it when he gets tight. Has the other inhaler (also written for albuterol) that he uses regularly at home. Sounds like he has been using fluticasone inhaler as rescue inhaler and albuterol as scheduled one. I explain that he may be mixing the two up; the albuterol if for use as needed and the fluticasone is to be used regularly.    Hospitalized for asthma only once when he was in 26s or 1s.     Looking forward to salmon fishing season. These [place]. Catches 30 pound fish which would cost $100 at the market.    PHYSICAL EXAM:  BP 145/96  Pulse 64  Temp(Src) 97.9 F (36.6 C) (Temporal)  Resp 16  Ht 5\' 6"  (1.676 m)  Wt 249 lb 3.2 oz (113.036 kg)  BMI 40.24 kg/m2  Wt Readings from Last 6 Encounters:   09/14/13 249 lb 3.2 oz (113.036 kg)   07/18/13 241 lb (109.317 kg)   04/19/13 228 lb 9.6 oz (103.692 kg)   10/05/12 247 lb (112.038 kg)   01/01/12 247 lb (112.038 kg)   08/05/11 245 lb (111.131 kg)     BP Readings from Last 6 Encounters:   09/14/13 145/96   07/18/13 127/87   04/19/13 117/65   10/05/12  122/77   07/07/12 138/93   01/01/12 136/86     General: no acute distress, wearing his camouflage jacket  Respiratory: no nasal flairing or evidence of use of accessory muscles; sats of 96-99% in office; ?end expiratory wheezing vs upper airway noises, but good air movement  Cardiovascular: no peripheral edema, RRR, extremities wwp    ASSESSMENT AND PLAN:    # URI causing mild asthma exacerbation.     # Gout  - colchicine for gout flares  - discussed foods that can precipitate flairs  - naproxen PRN  - we discussed new colchicine flair    Deferred  # Obesity  - discussed diet and exercise   - referred him to see nutritionist    # Pre-Diabetes 06/2013 HA1C 6.0 (6.2 <- 6.3 <- 5.9 05/2011)  - discussed diet and exercise   - referred him to see nutritionist  - can consider starting metformin    # Health care maintenance    Cardiovascular  (TC 143 LDL 83 06/2012)  ASCVD Risk Estimator: if Diabetes Y  10-yr risk 14.6% if Diabetes N 10-yr risk 8% (6.2% w optimal RFs)  On atorva 40 (moderate)  On asa   Cancer screening  negative FOBT 11/2010  Infectious disease   HCV nonreactive 06/2012   HIV nonreactive12/2013   Immunizations   States he had flu vaccine  Tdap 01/2011   pneumococccal 23 01/2009   Hep B 04/2008     1.2 mg at the first sign of flare, followed in 1 hour with a single dose of 0.6 mg (maximum: 1.8 mg within 1 hour). Patients receiving prophylaxis therapy may receive treatment dosing; wait 12 hours before resuming prophylaxis dose    SUPPLEMENTAL INFO:    PROBLEM LIST:  Patient Active Problem List    Diagnosis Date Noted   . Irregular heart beat 10/05/2012   . Skin tag 08/05/2011   . Gout 05/27/2011   . Ankle pain, R  05/27/2011   . OBESITY NOS 11/24/2008   . ASTHMA NOS W STATUS ASTHMATICUS 11/24/2008   . GENERAL OSTEOARTHROSIS 11/24/2008   . HYPERLIPIDEMIA NEC/NOS 11/24/2008   . COMPLIC-URINARY TRACT [id number]   . PRESBYOPIA 11/24/2008     SOCIAL HISTORY:    MEDICATIONS:  Current Outpatient Prescriptions    Medication Sig Dispense Refill   . Albuterol Sulfate HFA 108 (90 BASE) MCG/ACT Inhalation Aero Soln Inhale 2 puffs by mouth every 6 hours as needed. For shortness of breath.  1 Inhaler  12   . Aspirin 81 MG Oral Tab EC Take 1 tablet (81 mg) by mouth daily.  30 tablet  12   . Atorvastatin Calcium 40 MG Oral Tab Take 1 tablet (40 mg) by mouth daily.  30 tablet  12   . Colchicine 0.6 MG Oral Tab Take 2 tablets by mouth with gout flare the first day, then 1 tablet a day for 7 days  30 tablet  0   . Fluticasone Propionate  HFA 110 MCG/ACT Inhalation Aerosol Inhale 2 puffs by mouth every 12 hours. Use with spacer chamber.  1 Inhaler  12   . Fluticasone Propionate 50 MCG/ACT Nasal Suspension Spray 2 sprays into each nostril daily.  1 Inhaler  12   . Ketoconazole 2 % External Shampoo lather daily  120 mL  12   . Loratadine 10 MG Oral Tab 1 each day for allergy  30 tablet  9     No current facility-administered medications for this visit.     ALLERGIES:  Review of patient's allergies indicates:  No Known Allergies

## 2013-09-22 MED ORDER — COLCHICINE 0.6 MG OR TABS
0.6000 mg | ORAL_TABLET | Freq: Every day | ORAL | Status: DC
Start: 2013-09-14 — End: 2014-08-07

## 2013-09-22 NOTE — Progress Notes (Signed)
[facility] - Outpatient Return Clinic Note    DATE: 09/14/2013     ID/CHIEF COMPLAINT: Nathan [name] is a 63 year old male who is here today for URI; and Gout    Patient Active Problem List   Diagnosis   . OBESITY NOS   . ASTHMA NOS W STATUS ASTHMATICUS   . GENERAL OSTEOARTHROSIS   . HYPERLIPIDEMIA NEC/NOS   . COMPLIC-URINARY TRACT   . PRESBYOPIA   . Gout   . Ankle pain, R    . Skin tag   . Irregular heart beat       INTERVAL HISTORY/HPI:  When I last saw him in late December I wrote a note to myself to discuss new dosing of colchicine. Earlier this month around February 1st or 2nd took just 2 colchicine and used ice and he was able to stay on top of the flair.    Cough, which started yesterday coughing up yellow phlegm and wheezing. Slight headache and eyes are bugging him.  States his breathing is ok. Has his fluticasone inhaler in his pocket. He uses it when he gets tight. Has the other inhaler (also written for albuterol) that he uses regularly at home. Sounds like he has been using fluticasone inhaler as rescue inhaler and albuterol as scheduled one. I explain that he may be mixing the two up; the albuterol if for use as needed and the fluticasone is to be used regularly.    Hospitalized for asthma only once when he was in 29s or 42s.     Looking forward to salmon fishing season. [place]. Catches 30 pound fish which would cost $100 at the market.    PHYSICAL EXAM:  BP 145/96  Pulse 64  Temp(Src) 97.9 F (36.6 C) (Temporal)  Resp 16  Ht 5\' 6"  (1.676 m)  Wt 249 lb 3.2 oz (113.036 kg)  BMI 40.24 kg/m2  Wt Readings from Last 6 Encounters:   09/14/13 249 lb 3.2 oz (113.036 kg)   07/18/13 241 lb (109.317 kg)   04/19/13 228 lb 9.6 oz (103.692 kg)   10/05/12 247 lb (112.038 kg)   01/01/12 247 lb (112.038 kg)   08/05/11 245 lb (111.131 kg)     BP Readings from Last 6 Encounters:   09/14/13 145/96   07/18/13 127/87   04/19/13 117/65   10/05/12 122/77      07/07/12 138/93   01/01/12 136/86     General: no acute distress, wearing his camouflage jacket, BMI 40  Respiratory: no nasal flairing or evidence of use of accessory muscles; sats of 96-99% in office; ?end expiratory wheezing vs upper airway noises, but good air movement  Cardiovascular: no peripheral edema, RRR, extremities wwp    ASSESSMENT AND PLAN:    # URI causing mild increase in asthma symptoms. Exam is reassuring as he has good air movement and his breathing in unlabored.  - counseled him to call clinic or go to ER if asthma symptoms worsen   - clarified how to use his inhalers prn vs daily maintenance    # Gout  - colchicine for gout flares  - discussed foods that can precipitate flairs  - naproxen PRN (no longer listed in his med list; will check on this at next visit)  - we discussed new colchicine dosing for flairs   1.2 mg at the first sign of flare, followed in 1 hour with a single dose of  0.6 mg (maximum: 1.8 mg within 1 hour).     Deferred  # Obesity  - discussed diet and exercise at previous visit  - referred him to see nutritionist, but he missed the appointment with [name]    # Pre-Diabetes 06/2013 HA1C 6.0 (6.2 <- 6.3 <- 5.9 05/2011)  - discussed diet and exercise   - referred him to see nutritionist, but he missed the appointment with [name]  - can consider starting metformin    # Health care maintenance    Cardiovascular  (TC 143 LDL 83 06/2012)  ASCVD Risk Estimator: if Diabetes Y 10-yr risk 14.6% if Diabetes N 10-yr risk 8% (6.2% w optimal RFs)  On atorva 40 (moderate-intensity)  On asa   Cancer screening  negative FOBT 11/2010 DUE AGAIN  Infectious disease   HCV nonreactive 06/2012   HIV nonreactive12/2013   Immunizations   States he had flu vaccine  Tdap 01/2011   pneumococccal 23 01/2009   Hep B 04/2008    Patient discussed with Dr. Babs Sciara  RTC in 3 months    SUPPLEMENTAL INFO:    PROBLEM LIST:  Patient Active Problem List    Diagnosis Date Noted   . Irregular heart beat  10/05/2012   . Skin tag 08/05/2011   . Gout 05/27/2011   . Ankle pain, R  05/27/2011   . OBESITY NOS 11/24/2008   . ASTHMA NOS W STATUS ASTHMATICUS 11/24/2008   . GENERAL OSTEOARTHROSIS 11/24/2008   . HYPERLIPIDEMIA NEC/NOS 11/24/2008   . 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 PRESBYOPIA 11/24/2008     SOCIAL HISTORY:    MEDICATIONS:  Current Outpatient Prescriptions   Medication Sig Dispense Refill   . Albuterol Sulfate HFA 108 (90 BASE) MCG/ACT Inhalation Aero Soln Inhale 2 puffs by mouth every 6 hours as needed. For shortness of breath.  1 Inhaler  12   . Aspirin 81 MG Oral Tab EC Take 1 tablet (81 mg) by mouth daily.  30 tablet  12   . Atorvastatin Calcium 40 MG Oral Tab Take 1 tablet (40 mg) by mouth daily.  30 tablet  12   . Colchicine 0.6 MG Oral Tab Take 2 tablets by mouth with gout flare the first day, then 1 tablet a day for 7 days  30 tablet  0   . Fluticasone Propionate  HFA 110 MCG/ACT Inhalation Aerosol Inhale 2 puffs by mouth every 12 hours. Use with spacer chamber.  1 Inhaler  12   . Fluticasone Propionate 50 MCG/ACT Nasal Suspension Spray 2 sprays into each nostril daily.  1 Inhaler  12   . Ketoconazole 2 % External Shampoo lather daily  120 mL  12   . Loratadine 10 MG Oral Tab 1 each day for allergy  30 tablet  9     No current facility-administered medications for this visit.     ALLERGIES:  Review of patient's allergies indicates:  No Known Allergies

## 2013-10-13 NOTE — Progress Notes (Signed)
-------------------------------------------    Attending: Thomasene Lot  I discussed the history, exam and medical decision making for this patient with Dr. Corky Sox, Westley Foots. I was present in the clinic during the provisions of these services with no other clinical duties. I concur with the management plan as noted above.  -------------------------------------------

## 2013-12-13 ENCOUNTER — Ambulatory Visit (HOSPITAL_BASED_OUTPATIENT_CLINIC_OR_DEPARTMENT_OTHER): Payer: 59 | Attending: Internal Medicine | Admitting: Internal Medicine

## 2013-12-13 ENCOUNTER — Encounter (HOSPITAL_BASED_OUTPATIENT_CLINIC_OR_DEPARTMENT_OTHER): Payer: Self-pay | Admitting: Internal Medicine

## 2013-12-13 VITALS — BP 141/95 | HR 51 | Temp 96.4°F | Ht 66.0 in | Wt 244.0 lb

## 2013-12-13 DIAGNOSIS — M79645 Pain in left finger(s): Secondary | ICD-10-CM

## 2013-12-13 DIAGNOSIS — M79609 Pain in unspecified limb: Secondary | ICD-10-CM | POA: Insufficient documentation

## 2013-12-13 NOTE — Progress Notes (Signed)
[facility] - Outpatient Return Clinic Note    DATE: 12/13/2013     ID/CHIEF COMPLAINT: Nathan [name] is a 63 year old male who is here today for follow-up.    INTERVAL HISTORY/HPI:  Complaining of left ring finger stiffness started 6 weeks ago. Never had it before. Steady irritation "not really a pain." Mild swelling which resolved. No redness. No trauma that he can recall; he is right handed. Was limited in bending the fingers, but better able to bend today. Middle finger affected as well. Slight throb but doesn't really hurt.    Not needing the albuterol inhaler much. Can walk half the way around [facility] without getting short of breath.     Last flair of gout was of right big toe in mid late Spring. Left big toe got stiff 3-3.5 weeks ago.  Went away with colchicine.     Meds  Still using loratidine, ketoconzaole. Also still taking asa and atorvastatin. Not using nasal spray or fluticasone inhaler as much.       PHYSICAL EXAM:  BP 141/95  Pulse 51  Temp(Src) 96.4 F (35.8 C) (Temporal)  Ht 5\' 6"  (1.676 m)  Wt 244 lb (110.678 kg)  BMI 39.40 kg/m2    Wt Readings from Last 6 Encounters:   09/14/13 249 lb 3.2 oz (113.036 kg)   07/18/13 241 lb (109.317 kg)   04/19/13 228 lb 9.6 oz (103.692 kg)   10/05/12 247 lb (112.038 kg)   01/01/12 247 lb (112.038 kg)   08/05/11 245 lb (111.131 kg)     BP Readings from Last 6 Encounters:   09/14/13 145/96   07/18/13 127/87   04/19/13 117/65   10/05/12 122/77   07/07/12 138/93   01/01/12 136/86     General: no acute distress, BMI 39  Respiratory: breathing comfortably on room air  Left Hand: good range of motion in both hands; able to make a fist with both hands; no erythema/swelling of either hand; no effusions noted; slight tenderness to palpation over palmar side of the MCPs near 3rd and 4th fingers of left hand    ASSESSMENT AND PLAN:    Possible his finger symptoms related to Dupuytren's contracture. On the differential for this are the following  diagnoses: diabetic cheiroarthropathy (limited joint mobility), palmar fibromatosis (also referred to as palmar fasciitis), camptodactyly, traumatic scars, Volkmann's ischemic contracture, and intrinsic joint disease. He does not have diabetes. Currently, the symptoms are not too bothersome although he has had to make adjustments while fishing. I encouraged him to use gloves with padding across the palm and counseled him that symptoms may subside and that we can send him to be evaluated for surgery if symptoms worsen or he finds them more bothersome.    Of note, pressures 140s/90s at this visit and previous visit . Previous many visits in a row with normotension. Will CTM and consider starting anti-hypertensive.    Deferred  # Obesity  - discussed diet and exercise at previous visit  - referred him to see nutritionist, but he missed the appointment with Ed Blalock    # Pre-Diabetes 06/2013 HA1C 6.0 (6.2 <- 6.3 <- 5.9 05/2011)  - discussed diet and exercise   - referred him to see nutritionist, but he missed the appointment with Ed Blalock  - can consider starting metformin  - can check HA1C again (can check roughly annually)  - he is on a atorva 40    # Gout  - colchicine for  gout flares  1.2 mg at the first sign of flare, followed in 1 hour with a single dose of 0.6 mg (maximum: 1.8 mg within 1 hour).   - we have discussed foods that can precipitate flairs  - naproxen PRN (no longer listed in his med list; will check on this at next visit)    # Health care maintenance  Cardiovascular  (TC 143 LDL 83 06/2012)  ASCVD Risk Estimator: if Diabetes Y 10-yr risk 14.6% if Diabetes N 10-yr risk 8% (6.2% w optimal RFs)  On atorva 40 (moderate-intensity)  On asa   Cancer screening  negative FOBT 11/2010 DUE AGAIN  Infectious disease   HCV nonreactive 06/2012   HIV nonreactive12/2013   Immunizations   States he had flu vaccine  Tdap 01/2011   pneumococccal 23 01/2009   Hep B 04/2008      Patient seen and discussed with Dr.  Glennon Mac.  RTC in 3-4 months.    SUPPLEMENTAL INFO:    PROBLEM LIST:  Patient Active Problem List    Diagnosis Date Noted   . Irregular heart beat 10/05/2012   . Skin tag 08/05/2011   . Gout 05/27/2011   . Ankle pain, R  05/27/2011   . OBESITY NOS 11/24/2008   . ASTHMA NOS W STATUS ASTHMATICUS 11/24/2008   . GENERAL OSTEOARTHROSIS 11/24/2008   . HYPERLIPIDEMIA NEC/NOS 11/24/2008   . COMPLIC-URINARY TRACT 47/42/5956   . PRESBYOPIA 11/24/2008     SOCIAL HISTORY:  History     Social History Narrative     MEDICATIONS:  Current Outpatient Prescriptions   Medication Sig Dispense Refill   . Albuterol Sulfate HFA 108 (90 BASE) MCG/ACT Inhalation Aero Soln Inhale 2 puffs by mouth every 6 hours as needed. For shortness of breath. 1 Inhaler 12   . Aspirin 81 MG Oral Tab EC Take 1 tablet (81 mg) by mouth daily. 30 tablet 12   . Atorvastatin Calcium 40 MG Oral Tab Take 1 tablet (40 mg) by mouth daily. 30 tablet 12   . Colchicine 0.6 MG Oral Tab Take 1 tablet (0.6 mg) by mouth daily. Two 0.6 mg tabs (total 1.2 mg) at first sign of flare, followed in 1 hr with a single tablet (0.6 mg) 30 tablet 1   . Fluticasone Propionate  HFA 110 MCG/ACT Inhalation Aerosol Inhale 2 puffs by mouth every 12 hours. Use with spacer chamber. 1 Inhaler 12   . Fluticasone Propionate 50 MCG/ACT Nasal Suspension Spray 2 sprays into each nostril daily. 1 Inhaler 12   . Ketoconazole 2 % External Shampoo lather daily 120 mL 12   . Loratadine 10 MG Oral Tab 1 each day for allergy 30 tablet 9     No current facility-administered medications for this visit.     ALLERGIES:  Review of patient's allergies indicates:  No Known Allergies

## 2013-12-17 DIAGNOSIS — M79645 Pain in left finger(s): Secondary | ICD-10-CM | POA: Insufficient documentation

## 2013-12-23 NOTE — Progress Notes (Signed)
-------------------------------------------    Attending: Rosalita Levan  I saw and evaluated the patient and agree with Dr. Clent Ridges note.   ----------------------------------------

## 2013-12-26 NOTE — Progress Notes (Signed)
Attending: Macio Kissoon H Aubrina Nieman, MD, MPH    I have personally discussed the case with the resident during or immediately after the patient visit including review of history, physical exam, diagnosis, and treatment plan. I agree with the assessment and plan of care.

## 2014-06-12 ENCOUNTER — Ambulatory Visit (HOSPITAL_BASED_OUTPATIENT_CLINIC_OR_DEPARTMENT_OTHER): Payer: 59 | Attending: Internal Medicine | Admitting: Internal Medicine

## 2014-06-12 ENCOUNTER — Encounter (HOSPITAL_BASED_OUTPATIENT_CLINIC_OR_DEPARTMENT_OTHER): Payer: Self-pay | Admitting: Internal Medicine

## 2014-06-12 VITALS — BP 129/88 | HR 56 | Temp 96.8°F | Ht 66.0 in | Wt 240.0 lb

## 2014-06-12 DIAGNOSIS — Z23 Encounter for immunization: Secondary | ICD-10-CM | POA: Insufficient documentation

## 2014-06-12 DIAGNOSIS — Z131 Encounter for screening for diabetes mellitus: Secondary | ICD-10-CM | POA: Insufficient documentation

## 2014-06-12 DIAGNOSIS — R944 Abnormal results of kidney function studies: Secondary | ICD-10-CM | POA: Insufficient documentation

## 2014-06-12 DIAGNOSIS — M9261 Juvenile osteochondrosis of tarsus, right ankle: Secondary | ICD-10-CM | POA: Insufficient documentation

## 2014-06-12 LAB — COMPREHENSIVE METABOLIC PANEL
ALT (GPT): 23 U/L (ref 10–48)
AST (GOT): 21 U/L (ref 9–38)
Albumin: 4.3 g/dL (ref 3.5–5.2)
Alkaline Phosphatase (Total): 75 U/L (ref 37–159)
Anion Gap: 5 (ref 4–12)
Bilirubin (Total): 0.7 mg/dL (ref 0.2–1.3)
Calcium: 9.7 mg/dL (ref 8.9–10.2)
Carbon Dioxide, Total: 30 mEq/L (ref 22–32)
Chloride: 105 mEq/L (ref 98–108)
Creatinine: 1.37 mg/dL — ABNORMAL HIGH (ref 0.51–1.18)
GFR, Calc, African American: >60 mL/min (ref >59)
GFR, Calc, European American: 53 mL/min — ABNORMAL LOW (ref >59)
Glucose: 95 mg/dL (ref 62–125)
Potassium: 4.1 mEq/L (ref 3.6–5.2)
Protein (Total): 7.6 g/dL (ref 6.0–8.2)
Sodium: 140 mEq/L (ref 135–145)
Urea Nitrogen: 24 mg/dL — ABNORMAL HIGH (ref 8–21)

## 2014-06-12 NOTE — Progress Notes (Signed)
VACCINE SCREENING    Order date: 06/12/2014  Ordering provider: Nazlee, Hind[place] used: NO  Interpreter used?: None  Vaccine information sheet(s) discussed, patient/parent/guardian verbalized understanding? YES   Vaccines given: influenza  VIS given 06/12/2014 by Mady Gemma MA.      SCREENING: INACTIVATED VACCINES  NO 1. Do you have a high fever, severe cold-like symptoms or serious infection?  NO 2. Do you have any food allergies such as eggs, chicken, chicken feathers, chicken dander, or gelatin?  NO 3. Do you have any allergies to neomycin, streptomycin, or polymyxin?   NO 4. Have you had any severe reaction to a vaccine in the past such as a seizure, a convulsion from a high fever, or a paralysis problem called Guillain-Barre Syndrome?    If the patient/parent/or guardian answered "yes" to any of the above questions, consult with the provider to review the responses prior to administering vaccination. Parents with a contraindication to immunization will not receive the immunization without a specific physician order to vaccinate the patient and discussion of risk and benefits related to the contraindication.     Physician Order for Administration of Vaccine(s) When Contraindications Are Present  I have reviewed the existence in this patient's health history of possible contraindication(s) to administration of vaccine. The benefits to this patient outweigh the potential risks of immunization. Administer vaccine(s):  [place], [place], 06/12/2014 2:17 PM MA.

## 2014-06-12 NOTE — Progress Notes (Signed)
[facility]ult [facility] Clinic    Nathan [name] is a 63 year old male who presents today for follow-up    Last visit: may 2015. At that time, I made a few agenda items for next visit  - can consider checking HA1C again (can check roughly annually), BMP, urine protein:creatinine  - due for flu vaccine this season    Subjective:     History of Present Illness:     Left shoulder pain. Anterior part. Achey and sharp. He did almost trip about a month ago [place] and his left arm flung upward. He has decreased range of motion but it is getting better. Some radiation down his arm toward teh fingers. No chest pain.  Also has cold symptoms.  Many years ago Achilles tendon "popped." "Knot" sitting there" which intermittently swells up which causes him difficulty wearing certain shoes.   Got over a cold a week ago yesterday. Went fishing again yesterday and then developed another cough.     Objective:     BP 129/88 mmHg  Pulse 56  Temp(Src) 96.8 F (36 C) (Temporal)  Ht 5\' 6"  (1.676 m)  Wt 240 lb (108.863 kg)  BMI 38.76 kg/m2  SpO2 99%    Exam:      BP Readings from Last 5 Encounters:   12/13/13 141/95   09/14/13 145/96   07/18/13 127/87   04/19/13 117/65   10/05/12 122/77     Wt Readings from Last 5 Encounters:   12/13/13 244 lb (110.678 kg)   09/14/13 249 lb 3.2 oz (113.036 kg)   07/18/13 241 lb (109.317 kg)   04/19/13 228 lb 9.6 oz (103.692 kg)   10/05/12 247 lb (112.038 kg)         General: well-appearig  male no acute distress  HENT: sounds congested  MSK:   Shoulder:  Right ankle:  Extremities: warm well perfused  Cardiovascular: RRR  Respiratory: non labored breathing.        Assessment and Plan:      Today, we discussed his left shoulder pain and his right achilles tendon. Given exam and symptoms left shoulder exam most concerning for impingement +/- element of tendinopathy. It has been getting better and I expect that it will continue to improve. Can consider imaging if worsens. Can also  consider PT if not getting better. For his right ankle nodule we created a foam donut to help with putting on shoes and reducing rubbing which could cause bursitis. Also referred to podiatry for him to discuss surgical and non-surgical options for the Haglund's deformity.  Flu vaccine today.    # Hypertension  Of note, pressures 140s/90s at previous visits. Better today. Previous many visits in a row with normotension. Will CTM and consider starting anti-hypertensive.    # Obesity  - continue to discuss diet and exercise  - previously referred him to see nutritionist    # Pre-Diabetes 06/2013 HA1C 6.0 (6.2 <- 6.3 <- 5.9 05/2011)  - continue to discuss diet and exercise   - referred him to see nutritionist, but he missed the appointment with Ed Blalock  - check HA1C again (can check roughly annually)  - he is on atorva 40  - can consider starting metformin    # Gout  - colchicine for gout flares  1.2 mg at the first sign of flare, followed in 1 hour with a single dose of 0.6 mg (maximum: 1.8 mg within 1 hour).   - we  have discussed the foods that can precipitate flairs    Cardiovascular  (TC 143 LDL 83 06/2012)  ASCVD Risk Estimator: if Diabetes Y 10-yr risk 14.6% if Diabetes N 10-yr risk 8% (6.2% w optimal RFs)  On atorva 40 (moderate-intensity)  On asa     Follow up: 4-5 months or sooner prn  Patient seend and discussed with Dr. Ardelle Park.  For next visit:  - negative FOBT 11/2010 DUE AGAIN  - age 64.  Discuss shingles vaccine

## 2014-06-13 LAB — HEMOGLOBIN A1C, HPLC: Hemoglobin A1C: 6.4 % — ABNORMAL HIGH (ref 4.0–6.0)

## 2014-06-24 NOTE — Progress Notes (Signed)
ATTENDING NOTE  I saw and evaluated the patient with Nazlee Navabi, MD.  I have reviewed and agree with the documentation.

## 2014-07-21 HISTORY — PX: SURGICAL HX OTHER: 99

## 2014-07-24 ENCOUNTER — Other Ambulatory Visit (HOSPITAL_BASED_OUTPATIENT_CLINIC_OR_DEPARTMENT_OTHER): Payer: Self-pay | Admitting: Internal Medicine

## 2014-07-24 DIAGNOSIS — E785 Hyperlipidemia, unspecified: Secondary | ICD-10-CM

## 2014-07-24 DIAGNOSIS — R7303 Prediabetes: Secondary | ICD-10-CM

## 2014-07-25 MED ORDER — ATORVASTATIN CALCIUM 40 MG OR TABS
40.0000 mg | ORAL_TABLET | Freq: Every evening | ORAL | Status: DC
Start: 2014-07-25 — End: 2015-02-22

## 2014-08-01 ENCOUNTER — Other Ambulatory Visit (HOSPITAL_BASED_OUTPATIENT_CLINIC_OR_DEPARTMENT_OTHER): Payer: Self-pay | Admitting: Internal Medicine

## 2014-08-01 DIAGNOSIS — Z0000 Encounter for general adult medical examination without abnormal findings: Secondary | ICD-10-CM

## 2014-08-02 MED ORDER — ASPIRIN EC LOW DOSE 81 MG OR TBEC
DELAYED_RELEASE_TABLET | ORAL | Status: DC
Start: 2014-08-02 — End: 2015-05-01

## 2014-08-07 ENCOUNTER — Other Ambulatory Visit (HOSPITAL_BASED_OUTPATIENT_CLINIC_OR_DEPARTMENT_OTHER): Payer: Self-pay | Admitting: Internal Medicine

## 2014-08-07 DIAGNOSIS — M109 Gout, unspecified: Secondary | ICD-10-CM

## 2014-08-08 MED ORDER — COLCRYS 0.6 MG OR TABS
ORAL_TABLET | ORAL | Status: DC
Start: 2014-08-08 — End: 2015-01-24

## 2014-10-22 ENCOUNTER — Other Ambulatory Visit (HOSPITAL_BASED_OUTPATIENT_CLINIC_OR_DEPARTMENT_OTHER): Payer: Self-pay | Admitting: Internal Medicine

## 2014-10-22 DIAGNOSIS — J309 Allergic rhinitis, unspecified: Secondary | ICD-10-CM

## 2014-10-24 MED ORDER — LORATADINE 10 MG OR TABS
10.0000 mg | ORAL_TABLET | Freq: Every day | ORAL | Status: DC
Start: 2014-10-24 — End: 2015-11-18

## 2014-11-14 ENCOUNTER — Ambulatory Visit (HOSPITAL_BASED_OUTPATIENT_CLINIC_OR_DEPARTMENT_OTHER): Payer: 59 | Attending: Internal Medicine | Admitting: Internal Medicine

## 2014-11-14 ENCOUNTER — Encounter (HOSPITAL_BASED_OUTPATIENT_CLINIC_OR_DEPARTMENT_OTHER): Payer: Self-pay | Admitting: Internal Medicine

## 2014-11-14 VITALS — BP 131/91 | HR 73 | Temp 94.8°F | Resp 18 | Ht 66.0 in | Wt 238.0 lb

## 2014-11-14 DIAGNOSIS — Z1211 Encounter for screening for malignant neoplasm of colon: Secondary | ICD-10-CM | POA: Insufficient documentation

## 2014-11-14 DIAGNOSIS — N182 Chronic kidney disease, stage 2 (mild): Secondary | ICD-10-CM | POA: Insufficient documentation

## 2014-11-14 LAB — PROTEIN/CREATININE RATIO, TIMED URINE
Creatinine/Unit, Urine: 178 mg/dL
Protein (Total), Urine: 7 mg/dL
Protein/Creatinine Ratio: <0.1 (ref <0.2)

## 2014-11-14 NOTE — Progress Notes (Signed)
IDENTIFICATION  Nathan [name] is a 64 year old male who presents today for follow-up    Patient Active Problem List   Diagnosis   . OBESITY NOS   . ASTHMA NOS W STATUS ASTHMATICUS   . GENERAL OSTEOARTHROSIS   . HYPERLIPIDEMIA NEC/NOS   . COMPLIC-URINARY TRACT   . PRESBYOPIA   . Gout   . Ankle pain, R    . Skin tag   . Irregular heart beat   . Pain in finger of left hand     Outpatient Prescriptions Prior to Visit   Medication Sig Dispense Refill   . Albuterol Sulfate HFA 108 (90 BASE) MCG/ACT Inhalation Aero Soln Inhale 2 puffs by mouth every 6 hours as needed. For shortness of breath. 1 Inhaler 12   . ASPIRIN EC LOW DOSE 81 MG Oral Tab EC Take 1 tablet by mouth 1 time a day to prevent heart attack/stroke 30 tablet 11   . Atorvastatin Calcium 40 MG Oral Tab Take 1 tablet (40 mg) by mouth every evening. To lower Cholesterol. 30 tablet 5   . COLCRYS 0.6 MG Oral Tab At first sign of flare, take 2 tablets, followed in 1 hour by 1 tablet. 30 tablet 0   . Fluticasone Propionate  HFA 110 MCG/ACT Inhalation Aerosol Inhale 2 puffs by mouth every 12 hours. Use with spacer chamber. 1 Inhaler 12   . Fluticasone Propionate 50 MCG/ACT Nasal Suspension Spray 2 sprays into each nostril daily. 1 Inhaler 12   . Ketoconazole 2 % External Shampoo lather daily 120 mL 12   . Loratadine 10 MG Oral Tab Take 1 tablet (10 mg) by mouth daily. For allergies. 30 tablet 5     No facility-administered medications prior to visit.       From last visit:  HA1C 6.4%  CMP with BUN 24 creatinine 1.37 (GFR>60); otherwise unremarkable    AGENDA  Urine protein:creatinine ratio  Shingles vaccine  FOBT      Subjective:     History of Present Illness:       Ran up from [place] pretty fast so is sweating. Felt short of breath walking up. Forgot his inhaler.  Gets up at night to pee, but thinks it is because he drinks too much water  Still fishing.  Rescheduled at foot and ankle clinic for 5/13. He has been using the foam donut which he finds helpful for  the nodule on his Achilles in order to avoid rubbing against the shoe.    No chest pain. No shortness of breath.     Objective:     BP 149/81 mmHg  Pulse 73  Temp(Src) 94.8 F (34.9 C) (Temporal)  Resp 18  Ht 5\' 6"  (1.676 m)  Wt 238 lb (107.956 kg)  BMI 38.43 kg/m2      BP Readings from Last 5 Encounters:   06/12/14 129/88   12/13/13 141/95   09/14/13 145/96   07/18/13 127/87   04/19/13 117/65     Wt Readings from Last 5 Encounters:   06/12/14 240 lb (108.863 kg)   12/13/13 244 lb (110.678 kg)   09/14/13 249 lb 3.2 oz (113.036 kg)   07/18/13 241 lb (109.317 kg)   04/19/13 228 lb 9.6 oz (103.692 kg)         Exam:    General: well-appearing  male diaphoretic but just ran to appointment  Eyes: anicteric  HENT: atraumatic  Cardiovasc: RRR  Resp: breathing comfortably  Neuro: normal gait  Assessment and Plan:    1. Hypertension  - URINE PROTEIN/CREATININE RATIO  Based on this can consider starting ACE-I vs other anti-hypertensive.    2. Screen for colon cancer  - OCCULT BLOOD BY IA, STL    For his pre-diabetes he opts for exercise and diet changes rather than metformin.      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     Follow up: 6 months or sooner prn  Patient discussed with Dr. Sheila Oats

## 2014-11-20 NOTE — Progress Notes (Signed)
I have personally discussed the case with the resident during or immediately after the patient visit including review of history, physical exam, diagnosis, and treatment plan. I agree with the assessment and plan of care.

## 2014-11-21 ENCOUNTER — Ambulatory Visit (HOSPITAL_BASED_OUTPATIENT_CLINIC_OR_DEPARTMENT_OTHER)
Admit: 2014-11-21 | Discharge: 2014-11-21 | Disposition: A | Discharge status: Home / Self Care | Payer: 59 | Attending: Internal Medicine | Admitting: Internal Medicine

## 2014-11-21 DIAGNOSIS — Z1211 Encounter for screening for malignant neoplasm of colon: Secondary | ICD-10-CM | POA: Insufficient documentation

## 2014-11-22 LAB — OCCULT BLOOD BY IA, STL: Occult Bld 1 Result: NEGATIVE

## 2014-12-01 ENCOUNTER — Encounter (HOSPITAL_BASED_OUTPATIENT_CLINIC_OR_DEPARTMENT_OTHER): Payer: Self-pay | Admitting: Orthopedic Surgery

## 2014-12-01 ENCOUNTER — Encounter (HOSPITAL_BASED_OUTPATIENT_CLINIC_OR_DEPARTMENT_OTHER): Payer: 59 | Admitting: Orthopedic Surgery

## 2014-12-01 ENCOUNTER — Ambulatory Visit (HOSPITAL_BASED_OUTPATIENT_CLINIC_OR_DEPARTMENT_OTHER): Payer: 59 | Attending: Adult Health | Admitting: Orthopedic Surgery

## 2014-12-01 DIAGNOSIS — M6788 Other specified disorders of synovium and tendon, other site: Secondary | ICD-10-CM

## 2014-12-01 DIAGNOSIS — M25571 Pain in right ankle and joints of right foot: Secondary | ICD-10-CM | POA: Insufficient documentation

## 2014-12-01 DIAGNOSIS — M7662 Achilles tendinitis, left leg: Secondary | ICD-10-CM | POA: Insufficient documentation

## 2014-12-01 NOTE — Patient Instructions (Signed)
Thanks for seeing us today

## 2014-12-01 NOTE — Progress Notes (Signed)
ASSESSMENT:    Primary Learner patient  Patient Challenges to Learning none   Challenges Impacting this Teaching Session none  Interpreter used no    NEW PATIENT TEACHING    RN discussed the following information with pt:  PCC role in scheduling surgery  Pt letter req H&P for PCP  Smoking cessation  Content of pre-op visit  Discuss possible length of recovery time   Limitations of non-weight bearing      EDUCATION:    One-on-one teaching with patient  Foot and Ankle Handbook provided to pt   Post education response patient states he understands instructions

## 2014-12-01 NOTE — Progress Notes (Signed)
Dictated  [phone]

## 2014-12-02 NOTE — Progress Notes (Signed)
Nathan [name], Nathan [name]          [id number]          12/01/2014        CHIEF COMPLAINT     Hypertrophic painful Achilles tendon.    HISTORY     Nathan [name] is 44.  In the 1990s, he was playing basketball.  He says he got kicked in the heel and ruptured his Achilles tendon.  It was surgically repaired.  He has gone on to have a large hypertrophic healing with large protuberance posteriorly that is causing him difficulty.  He has trouble certainly with winter boots as he can barely get those on.  He has had some success in the past padding it off, but it is not reliable.  There are times when it just flat out hurts him.  It also hurts him when he gets gouty flares.  He is hoping that can be removed.  He notes swelling, stiffness, no numbness or catching.  It does feel weak.  Right now, he is in a flat roomy shoe.  He is not using any orthopedic appliances.  It hurts him occasionally, but it is certainly interfering with shoe gear and boots.    PAST MEDICAL HISTORY     Include:    1. Irregular heartbeat.  2. Skin tags.  3. Gout.  4. Obesity.  5. Asthma.  6. Hyperlipidemia.  7. Presbyopia.  8. Prediabetic.    SURGICAL HISTORY     The Achilles tendon repair as noted.    MEDICATIONS     Include:    1. Albuterol.  2. Aspirin.  3. Atorvastatin.  4. Calcium.  5. Colchicine.  6. Fluticasone inhaler.  7. Ketoconazole cream.  8. Loratadine.  9. Naproxen.    ALLERGIES     HE HAS NO KNOWN ALLERGIES TO MEDICATIONS.    SOCIAL HISTORY     Socially, he is not smoking.  He is not using marijuana, methadone, or any other drugs.  He never drinks.    REVIEW OF SYSTEMS     Negative for 14 systems reviewed and recorded on a separate sheet of paper.    PHYSICAL EXAMINATION     VITAL SIGNS:  He is 238 pounds, blood pressure is 137/71, a pulse of 47, a respiration rate of 15.  GENERAL:  He is well developed, well nourished, sitting comfortably in exam chair.  MUSCULOSKELETAL:  He has a very large protruding Achilles tendon prominence in the back.  It  almost looks like an extra appendage.  It is part of his Achilles tendon.  His Achilles tendon is thickened, very firm, and more or less nontender to palpation today.  Otherwise, he has no gross deformity of his foot and ankle.  No swelling, bruising, or erythema.  Normal pulses, skin, and sensation.  His tibiotalar and subtalar joints are palpated.  They are nontender.  He has 5/5 strength in all directions.  His ankle is stable.    IMAGING     X-rays of the ankle, AP, oblique, and lateral were taken and shown to the patient.  He has a fairly calcified Achilles tendon but it is in continuity.    ASSESSMENT     A hypertrophic Achilles tendon causing difficulty with shoe gear.    PLAN     Surgical excision.  We will pare it down and hopefully give him some relief.  The risks and complications were discussed.  We will sign him up for surgery and see  him at his preop.

## 2014-12-21 ENCOUNTER — Ambulatory Visit (HOSPITAL_BASED_OUTPATIENT_CLINIC_OR_DEPARTMENT_OTHER): Payer: 59 | Attending: Family Medicine | Admitting: Adult Health

## 2014-12-21 ENCOUNTER — Ambulatory Visit (HOSPITAL_BASED_OUTPATIENT_CLINIC_OR_DEPARTMENT_OTHER): Payer: 59

## 2014-12-21 VITALS — BP 123/62 | HR 50 | Resp 16 | Ht 66.0 in | Wt 238.0 lb

## 2014-12-21 DIAGNOSIS — Z01810 Encounter for preprocedural cardiovascular examination: Secondary | ICD-10-CM

## 2014-12-21 DIAGNOSIS — G8929 Other chronic pain: Secondary | ICD-10-CM | POA: Insufficient documentation

## 2014-12-21 DIAGNOSIS — Z01818 Encounter for other preprocedural examination: Secondary | ICD-10-CM | POA: Insufficient documentation

## 2014-12-21 DIAGNOSIS — M7662 Achilles tendinitis, left leg: Secondary | ICD-10-CM | POA: Insufficient documentation

## 2014-12-21 DIAGNOSIS — M6788 Other specified disorders of synovium and tendon, other site: Secondary | ICD-10-CM

## 2014-12-21 DIAGNOSIS — M25571 Pain in right ankle and joints of right foot: Secondary | ICD-10-CM | POA: Insufficient documentation

## 2014-12-21 LAB — BASIC METABOLIC PANEL
Anion Gap: 6 (ref 4–12)
Calcium: 9 mg/dL (ref 8.9–10.2)
Carbon Dioxide, Total: 28 meq/L (ref 22–32)
Chloride: 108 meq/L (ref 98–108)
Creatinine: 1.26 mg/dL — ABNORMAL HIGH (ref 0.51–1.18)
GFR, Calc, African American: >60 mL/min (ref >59)
GFR, Calc, European American: 58 mL/min — ABNORMAL LOW (ref >59)
Glucose: 100 mg/dL (ref 62–125)
Potassium: 4.1 meq/L (ref 3.6–5.2)
Sodium: 142 meq/L (ref 135–145)
Urea Nitrogen: 14 mg/dL (ref 8–21)

## 2014-12-21 LAB — B_TYPE NATRIURETIC PEPTIDE: B_Type Natriuretic Peptide: 12 pg/mL (ref <101)

## 2014-12-21 NOTE — Patient Instructions (Signed)
Operative consenting for a right Achilles tendon debridement and repair to be performed by Dr. Ashley Jacobs in January 01, 2015.  Patient signed their consent and I witnessed the patient's signature.  We discussed risk factors that included but not limited to infection, bleeding, nerve damage, non-unions, bone/device interphase, and possible bone fractures during implantation/removal, poor wound healing, and blood clots.  I demonstrated the proper technique for elevation.  We discussed non weight bearing following the Achilles tendon rupture/repair protocol.  The patient verbalized their understanding of the restrictions and risk factors.  The patient also understands that there will be a 2 week visit for wound check and probable suture removal.  At that time the patient will go into a boot or cast depending on intraoperative findings.  I answered the patient's questions, addressed their concerns, and reviewed x-rays.  The patient had ample time to ask questions of myself/physician.

## 2014-12-21 NOTE — Progress Notes (Signed)
ASSESSMENT:    Primary Learner: Patient  Patient Challenges to Learning: None   Challenges Impacting this Teaching Session: None  Interpreter used: No    POST-OP RECOMMENDED EQUIPMENT:  Bilat procedure: No  Mobility Devices: Pt was given Rx for crutches  Bathroom Equipment: None    PRE-OP EDUCATION:  Pre-surgery Instructions:   Bring equipment to hospital  Cleanse operative site with 2% chlorohexidine wipes    Discharge and Recovery Instructions: Call clinic at [phone] should you have any questions/concerns.  Call ortho pharmacy at [phone] for any medication questions. (i.e. Side effects, ineffective pain management, refill)     Post-op Appointment: 2 weeks after surgery    EDUCATION:    One-on-one teaching with written materials and demonstration  Foot and Ankle Handbook provided to pt   Post education response: Verbalized understanding  Time spent teaching: 10 minutes

## 2014-12-21 NOTE — Progress Notes (Signed)
REASON FOR VISIT:    Operative consenting for a {right Achilles tendon debridement and repair to be performed by Dr. Ashley Jacobs in January 01, 2015.    ASSESSMENT AND PLAN:    Operative consenting for a {right Achilles tendon debridement and repair to be performed by Dr. Ashley Jacobs in January 01, 2015.  Patient signed their consent and I witnessed the patient's signature.  We discussed risk factors that included but not limited to infection, bleeding, nerve damage, non-unions, bone/device interphase, and possible bone fractures during implantation/removal, poor wound healing, and blood clots.  I demonstrated the proper technique for elevation.  We discussed non weight bearing following the Achilles tendon rupture/repair protocol.  The patient verbalized their understanding of the restrictions and risk factors.  The patient also understands that there will be a 2 week visit for wound check and probable suture removal.  At that time the patient will go into a boot or cast depending on intraoperative findings.  I answered the patient's questions, addressed their concerns, and reviewed x-rays.  The patient had ample time to ask questions of myself/physician.     HISTORY OF PRESENT ILLNESS:  Nathan [name] is a 64 year old male. In the 1990s, he was playing basketball. He says he got kicked in the heel and ruptured his Achilles tendon. It was surgically repaired. He has gone on to have a large hypertrophic healing with large protuberance posteriorly that is causing him difficulty. He has trouble certainly with winter boots as he can barely get those on. He has had some success in the past padding it off, but it is not reliable. There are times when it just flat out hurts him. It also hurts him when he gets gouty flares. He is hoping that can be removed. He notes swelling, stiffness, no numbness or catching. It does feel weak. Right now, he is in a flat roomy shoe. He is not using any orthopedic appliances. It hurts him occasionally,  but it is certainly interfering with shoe gear and boots.     PHYSICAL EXAM    Today's pain scale 0/10    Filed Vitals:    12/21/14 1235   BP: 123/62   Pulse: 50   Resp: 16   Height: 5\' 6"  (1.676 m)   Weight: 238 lb (107.956 kg)       Skin:    No swelling, warm and dry,   Comments-large protuberance from the watershed area on the right Achilles tendon which has been prevalent since his original surgical repair years ago  Pulses:   Toes all warm and well perfused, palpable pedal pulse, compartments soft  Comments-  Sensation to light touch:   Superficial Peroneal Distribution- normal  Deep Peroneal Distribution - normal  Sural Distribution - normal  Saphenous Distribution -normal  Tibial Nerve Distribution - normal  Medial Plantar Distribution - normal  Lateral Plantar Distribution - normal   No plantar fascial pain    Comments-   Strength:   Ankle Dorsiflexion, 5/5  Ankle Plantarflexion, 5/5  Inversion, 5/5  Eversion, 5/5  Great Toe Dorsiflexion, 5/5  Comments-   Motion:   Ankle - diminished range of motion    Subtalar - full range of motion   1st MTP - full range of motion    1st TMT- full range of motion   2nd-5th TMT/MTP - full range of motion   Comment -   Cardiac:   Palpable pedal pulse skin is warm and dry  Neuro:  Awake, alert, and oriented to person, place, and time  Respiratory:   Respiratory rate regular and rhythmic, no shortness of breath, no audible  wheezes, no cough noted          Past Medical History   Diagnosis Date   . Obesity, unspecified 11/24/2008   . Unspecified asthma, with status asthmaticus 11/24/2008   . Generalized osteoarthrosis, unspecified site 11/24/2008   . Other and unspecified hyperlipidemia 11/24/2008   . Urinary complications 02/19/5002     No past surgical history on file.  Family History   Problem Relation Age of Onset   . Blindness No Hx Of    . Cataracts No Hx Of    . Glaucoma No Hx Of    . Macular Degeneration No Hx Of    . Retinal Detachment No Hx Of      Current Outpatient  Prescriptions   Medication Sig Dispense Refill   . Albuterol Sulfate HFA 108 (90 BASE) MCG/ACT Inhalation Aero Soln Inhale 2 puffs by mouth every 6 hours as needed. For shortness of breath. 1 Inhaler 12   . ASPIRIN EC LOW DOSE 81 MG Oral Tab EC Take 1 tablet by mouth 1 time a day to prevent heart attack/stroke 30 tablet 11   . Atorvastatin Calcium 40 MG Oral Tab Take 1 tablet (40 mg) by mouth every evening. To lower Cholesterol. 30 tablet 5   . COLCRYS 0.6 MG Oral Tab At first sign of flare, take 2 tablets, followed in 1 hour by 1 tablet. 30 tablet 0   . Fluticasone Propionate  HFA 110 MCG/ACT Inhalation Aerosol Inhale 2 puffs by mouth every 12 hours. Use with spacer chamber. 1 Inhaler 12   . Fluticasone Propionate 50 MCG/ACT Nasal Suspension Spray 2 sprays into each nostril daily. 1 Inhaler 12   . Ketoconazole 2 % External Shampoo lather daily 120 mL 12   . Loratadine 10 MG Oral Tab Take 1 tablet (10 mg) by mouth daily. For allergies. 30 tablet 5     No current facility-administered medications for this visit.     History     Social History   . Marital Status: Single     Spouse Name: N/A   . Number of Children: N/A   . Years of Education: N/A     Occupational History   . Not on file.     Social History Main Topics   . Smoking status: Former Smoker     Start date: 07/21/1961     Quit date: 07/22/1971   . Smokeless tobacco: Never Used   . Alcohol Use: No   . Drug Use: Not on file   . Sexual Activity: Not on file     Other Topics Concern   . Falls In Past Year No   . Preferred Learning Style Yes     no answer   . Personal/Religious/Cultural Values That Affect Care No   . Feels Safe At Home Yes     Social History Narrative     History   Drug Use Not on file     History   Alcohol Use No     Review of patient's allergies indicates no known allergies.      Jackelyn Poling, NP-BC  [facility]      This note was transcribed utilizing voice recognition software, which has a documented error rate of 9-15%

## 2014-12-22 LAB — HEMOGLOBIN A1C, HPLC: Hemoglobin A1C: 6.2 % — ABNORMAL HIGH (ref 4.0–6.0)

## 2014-12-22 LAB — R/O MRSA

## 2015-01-01 ENCOUNTER — Other Ambulatory Visit (HOSPITAL_BASED_OUTPATIENT_CLINIC_OR_DEPARTMENT_OTHER): Payer: Self-pay | Admitting: Orthopedic Surgery

## 2015-01-01 ENCOUNTER — Ambulatory Visit (HOSPITAL_BASED_OUTPATIENT_CLINIC_OR_DEPARTMENT_OTHER)
Admission: RE | Admit: 2015-01-01 | Discharge: 2015-01-01 | Disposition: A | Discharge status: Home / Self Care | Payer: 59 | Attending: Orthopedic Surgery | Admitting: Orthopedic Surgery

## 2015-01-01 ENCOUNTER — Ambulatory Visit (HOSPITAL_BASED_OUTPATIENT_CLINIC_OR_DEPARTMENT_OTHER): Payer: 59 | Admitting: Orthopedic Surgery

## 2015-01-01 DIAGNOSIS — K219 Gastro-esophageal reflux disease without esophagitis: Secondary | ICD-10-CM | POA: Insufficient documentation

## 2015-01-01 DIAGNOSIS — N182 Chronic kidney disease, stage 2 (mild): Secondary | ICD-10-CM | POA: Insufficient documentation

## 2015-01-01 DIAGNOSIS — J45909 Unspecified asthma, uncomplicated: Secondary | ICD-10-CM | POA: Insufficient documentation

## 2015-01-01 DIAGNOSIS — E669 Obesity, unspecified: Secondary | ICD-10-CM | POA: Insufficient documentation

## 2015-01-01 DIAGNOSIS — M1A9XX1 Chronic gout, unspecified, with tophus (tophi): Secondary | ICD-10-CM

## 2015-01-01 DIAGNOSIS — M1A0711 Idiopathic chronic gout, right ankle and foot, with tophus (tophi): Secondary | ICD-10-CM

## 2015-01-01 DIAGNOSIS — Y9367 Activity, basketball: Secondary | ICD-10-CM | POA: Insufficient documentation

## 2015-01-01 DIAGNOSIS — I499 Cardiac arrhythmia, unspecified: Secondary | ICD-10-CM | POA: Insufficient documentation

## 2015-01-01 DIAGNOSIS — E119 Type 2 diabetes mellitus without complications: Secondary | ICD-10-CM | POA: Insufficient documentation

## 2015-01-01 DIAGNOSIS — S86011S Strain of right Achilles tendon, sequela: Secondary | ICD-10-CM | POA: Insufficient documentation

## 2015-01-01 DIAGNOSIS — M1000 Idiopathic gout, unspecified site: Secondary | ICD-10-CM

## 2015-01-01 LAB — GLUCOSE POC, HMC: Glucose (POC): 102 mg/dL (ref 62–125)

## 2015-01-03 LAB — PATHOLOGY, SURGICAL

## 2015-01-11 ENCOUNTER — Other Ambulatory Visit (HOSPITAL_BASED_OUTPATIENT_CLINIC_OR_DEPARTMENT_OTHER): Payer: Self-pay | Admitting: Adult Health

## 2015-01-18 ENCOUNTER — Ambulatory Visit (HOSPITAL_BASED_OUTPATIENT_CLINIC_OR_DEPARTMENT_OTHER): Payer: 59 | Attending: Adult Health | Admitting: Adult Health

## 2015-01-18 VITALS — BP 128/62 | HR 62 | Resp 18 | Ht 62.0 in | Wt 220.0 lb

## 2015-01-18 DIAGNOSIS — M7662 Achilles tendinitis, left leg: Secondary | ICD-10-CM | POA: Insufficient documentation

## 2015-01-18 DIAGNOSIS — M6788 Other specified disorders of synovium and tendon, other site: Secondary | ICD-10-CM

## 2015-01-18 NOTE — Patient Instructions (Signed)
During your two week post op recovery period, elevation is one of the most important things you can do.  Elevating helps control swelling, speeds healing, and helps reduce pain.      Your Right ankle may feel as if it is swelling and toes may turn red, so elevate often.  When elevating, make sure your foot is elevated higher than your knee and your knee is elevated higher than your hip; remember fluid runs downhill.  If you have an ace wrap on please remove it before going to bed.    I will see you in approximately 1 weeks here at [facility]. [place] and [place] for wound check.     1) Precautions- continue weight bearing as tolerated in the cam boot   2) Medications- continue current  3) Orthotics- CAM boot    4) Physical Therapy- weightbearing as tolerated no therapy as of this moment  5) Follow Up - in 1 week for wound check either July 7 of July 8    Jackelyn Poling, NP-BC  [facility]

## 2015-01-18 NOTE — Progress Notes (Signed)
Result Type: Operative Report  Service Date: January 01, 2015 22:00   Performed By: Ashley Jacobs MD, Martina Sinner on January 01, 2015 12:40     PREOPERATIVE DIAGNOSIS:    Right Achilles chronic tendinopathy.    POSTOPERATIVE DIAGNOSIS:    Idiopathic gout.    PROCEDURE:    Right secondary repair Achilles tendon tear/tendinopathy.    HISTORY:    Nathan [name] is 48.  He had an Achilles tendon repair done in the past and has gone on to heal in a hypertrophic manner with a large protuberance posteriorly that has been interfering with shoe gear.  Overall his tendon does not hurt him.  He has a physical obstruction to getting shoes on.  The patient presented to [place] in clinic, we recommended debridement.  The risks and complications were discussed.  He signs consent freely.    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  It was debrided until we found a healthy border.    REASON FOR VISIT:    Nathan [name] is a 64 year old male is 2 weeks post op from the above stated procedure and is here for her post operative evaluation.    ASSESSMENT AND PLAN:    Nathan [name] is 2 weeks postop from the above stated procedure.  The patient's full sutures will remain in place for one additional week with clean dry daily dressings to be applied by the patient.  I would like him to come in either July 7 of July 8.  He may weight-bear as tolerated in his cam boot may remove the cam boot to let his foot air out otherwise the boot should be on at all times.  We had a good session.  I answered the patient's questions, addressed concerns and had ample time to ask questions of myself/physician.      PHYSICAL EXAM    Today's pain scale 0/10     Filed Vitals:    01/18/15 1249   BP: 128/62   Pulse: 62   Resp: 18   Height: 5\' 2"  (1.575 m)   Weight: 220 lb (99.791 kg)     Skin:    Incision is C/D/I/and a loosely approximated and there is no signs or symptoms of infection sutures are not ready to remove the present  time  Comments-large soft tissue prominence noted over the distal portion of the Achilles tendon  Pulses:   Toes all warm and well perfused, palpable pedal pulse, compartments soft  Comments-  Sensation to light touch:   Superficial Peroneal Distribution- normal  Deep Peroneal Distribution - normal  Sural Distribution - normal  Saphenous Distribution -normal  Tibial Nerve Distribution - normal  Medial Plantar Distribution - normal  Lateral Plantar Distribution - normal   No plantar fascial pain    Comments-    Motion:   Ankle - diminished range of motion    1st MTP - full range of motion     2nd-5th MTP - full range of motion    Comment -     Past Medical History   Diagnosis Date   . Obesity, unspecified 11/24/2008   . Unspecified asthma, with status asthmaticus 11/24/2008   . Generalized osteoarthrosis, unspecified site 11/24/2008   . Other and unspecified hyperlipidemia 11/24/2008   . Urinary complications 08/23/7626     No past surgical history on file.  Family History   Problem Relation Age of Onset   . Blindness No Hx Of    .  Cataracts No Hx Of    . Glaucoma No Hx Of    . Macular Degeneration No Hx Of    . Retinal Detachment No Hx Of      Current Outpatient Prescriptions   Medication Sig Dispense Refill   . Albuterol Sulfate HFA 108 (90 BASE) MCG/ACT Inhalation Aero Soln Inhale 2 puffs by mouth every 6 hours as needed. For shortness of breath. 1 Inhaler 12   . ASPIRIN EC LOW DOSE 81 MG Oral Tab EC Take 1 tablet by mouth 1 time a day to prevent heart attack/stroke 30 tablet 11   . Atorvastatin Calcium 40 MG Oral Tab Take 1 tablet (40 mg) by mouth every evening. To lower Cholesterol. 30 tablet 5   . COLCRYS 0.6 MG Oral Tab At first sign of flare, take 2 tablets, followed in 1 hour by 1 tablet. 30 tablet 0   . Fluticasone Propionate  HFA 110 MCG/ACT Inhalation Aerosol Inhale 2 puffs by mouth every 12 hours. Use with spacer chamber. 1 Inhaler 12   . Fluticasone Propionate 50 MCG/ACT Nasal Suspension Spray 2 sprays into each  nostril daily. 1 Inhaler 12   . Ketoconazole 2 % External Shampoo lather daily 120 mL 12   . Loratadine 10 MG Oral Tab Take 1 tablet (10 mg) by mouth daily. For allergies. 30 tablet 5     No current facility-administered medications for this visit.     History     Social History   . Marital Status: Single     Spouse Name: N/A   . Number of Children: N/A   . Years of Education: N/A     Occupational History   . Not on file.     Social History Main Topics   . Smoking status: Former Smoker     Start date: 07/21/1961     Quit date: 07/22/1971   . Smokeless tobacco: Never Used   . Alcohol Use: No   . Drug Use: Not on file   . Sexual Activity: Not on file     Other Topics Concern   . Falls In Past Year No   . Preferred Learning Style Yes     no answer   . Personal/Religious/Cultural Values That Affect Care No   . Feels Safe At Home Yes     Social History Narrative     History   Drug Use Not on file     History   Alcohol Use No     Review of patient's allergies indicates no known allergies.    Jackelyn Poling, NP-BC  [facility]    This note was transcribed utilizing voice recognition software, which has a documented error rate of 9-15%

## 2015-01-24 ENCOUNTER — Other Ambulatory Visit (HOSPITAL_BASED_OUTPATIENT_CLINIC_OR_DEPARTMENT_OTHER): Payer: Self-pay | Admitting: Internal Medicine

## 2015-01-24 DIAGNOSIS — M109 Gout, unspecified: Secondary | ICD-10-CM

## 2015-01-25 ENCOUNTER — Ambulatory Visit (HOSPITAL_BASED_OUTPATIENT_CLINIC_OR_DEPARTMENT_OTHER): Payer: 59 | Attending: Orthopedic Surgery | Admitting: Orthopedic Surgery

## 2015-01-25 VITALS — BP 125/78 | HR 71 | Resp 20 | Ht 62.0 in | Wt 220.0 lb

## 2015-01-25 DIAGNOSIS — M7662 Achilles tendinitis, left leg: Secondary | ICD-10-CM | POA: Insufficient documentation

## 2015-01-25 DIAGNOSIS — Z4789 Encounter for other orthopedic aftercare: Secondary | ICD-10-CM | POA: Insufficient documentation

## 2015-01-25 DIAGNOSIS — M6788 Other specified disorders of synovium and tendon, other site: Secondary | ICD-10-CM

## 2015-01-25 MED ORDER — COLCRYS 0.6 MG OR TABS
ORAL_TABLET | ORAL | Status: DC
Start: 2015-01-25 — End: 2015-05-01

## 2015-01-25 NOTE — Progress Notes (Signed)
Diagnoses:  Tophaceous gout within achilles tenodon    Date of Surgery:  01/01/2015 - Right secondary repair Achilles tendon tear/tendinopathy.     Identification:  Nathan [name] is a 64 year old year old male s/p the above surgery     Interval Events:  -has been keeping wound dry and clean  -denies fevers, chills, nausea  -no pain  -no drainage from wound      Review of Systems:  A complete review of systems was performed at the clinic visit. Positives include those noted in the HPI. All other systems are negative.    Past Medical History, Past Surgical History, Family History, Social History:   Unchanged from prior visits.    Medications:  Outpatient Prescriptions Prior to Visit   Medication Sig Dispense Refill   . Albuterol Sulfate HFA 108 (90 BASE) MCG/ACT Inhalation Aero Soln Inhale 2 puffs by mouth every 6 hours as needed. For shortness of breath. 1 Inhaler 12   . ASPIRIN EC LOW DOSE 81 MG Oral Tab EC Take 1 tablet by mouth 1 time a day to prevent heart attack/stroke 30 tablet 11   . Atorvastatin Calcium 40 MG Oral Tab Take 1 tablet (40 mg) by mouth every evening. To lower Cholesterol. 30 tablet 5   . COLCRYS 0.6 MG Oral Tab Take 2 tablets by mouth at first sign of flare followed by 1 tablet 1 hour later. 30 tablet 0   . Fluticasone Propionate  HFA 110 MCG/ACT Inhalation Aerosol Inhale 2 puffs by mouth every 12 hours. Use with spacer chamber. 1 Inhaler 12   . Fluticasone Propionate 50 MCG/ACT Nasal Suspension Spray 2 sprays into each nostril daily. 1 Inhaler 12   . Ketoconazole 2 % External Shampoo lather daily 120 mL 12   . Loratadine 10 MG Oral Tab Take 1 tablet (10 mg) by mouth daily. For allergies. 30 tablet 5     No facility-administered medications prior to visit.       Allergies:  Review of patient's allergies indicates:  No Known Allergies    Physical Exam:  Filed Vitals:    01/25/15 1434   BP: 125/78   Pulse: 71   Resp: 20   Height: 5\' 2"  (1.575 m)   Weight: 220 lb (99.791 kg)     General: well appearing,  no apparent distress, sitting comfortably in exam table  Mental Status: alert and oriented to person, place, time, situation  Respiratory: symmetric chest expansion, no acute resp distress, no audible wheezing    Musculoskeletal:    Right Lower Extremity Musculoskeletal Exam:  -Inspection: incision well healing, no erythema or induration; or wound dehiscence  -Palpation: nontender  -Sensory: 2/2 deep peroneal, 2/2 superficial peroneal, 2/2 tibial, 2/2 sural, 2/2 saphenous.  -Vascular: Warm and well perfused    Imaging:  No new imaging.    Assessment:  Doing well 3 weeks postop without evidence of complications.    Plan:  -WBAT, ROMAT  -sutures removed today, replaced with steri strips  -wean from boot to shoe; PT prescription given for progressive WB to shoe and proprioceptive therapy  -followup PRN    This patient was seen and evaluated with Dr. Ashley Jacobs.    Georgina Snell, MD  Resident Physician  [facility]   Dept. of [place]

## 2015-01-25 NOTE — Progress Notes (Signed)
Wound     Incision:YES  Drainage:YES  Redness: NO  Odor: NO  Swelling:NO  Sutures: Removed  Steri strips applied: NO  Pt tolerated procedure well: YES  I spent 15 min with the pt

## 2015-01-25 NOTE — Progress Notes (Signed)
I saw and evaluated the patient. I have reviewed the resident's documentation and agree with it.

## 2015-01-26 DIAGNOSIS — Z4789 Encounter for other orthopedic aftercare: Secondary | ICD-10-CM | POA: Insufficient documentation

## 2015-01-26 NOTE — Patient Instructions (Signed)
Thanks for seeing us today

## 2015-01-30 ENCOUNTER — Ambulatory Visit (HOSPITAL_BASED_OUTPATIENT_CLINIC_OR_DEPARTMENT_OTHER): Payer: 59 | Attending: Orthopedic Surgery | Admitting: Rehabilitative and Restorative Service Providers"

## 2015-01-30 DIAGNOSIS — M7662 Achilles tendinitis, left leg: Secondary | ICD-10-CM | POA: Insufficient documentation

## 2015-01-30 DIAGNOSIS — M7661 Achilles tendinitis, right leg: Secondary | ICD-10-CM | POA: Insufficient documentation

## 2015-01-30 DIAGNOSIS — M6788 Other specified disorders of synovium and tendon, other site: Secondary | ICD-10-CM

## 2015-01-30 NOTE — Progress Notes (Addendum)
PHYSICAL THERAPY INITIAL PLAN OF CARE            Certification      From:  01/30/2015           Through:   03/01/15    ONSET DATE:  01/01/2015  START OF CARE DATE: 01/30/2015    VISITS FROM SOC:   1      INTERPRETER  STATUS (Not needed, Telephonic, In Person):not needed   Referring Provider:  Jerilynn Som     Diagnosis:   Encounter Diagnosis   Name Primary?   . Achilles tendinosis of right lower extremity Yes        PRECAUTIONS:  Wean out of CAM boot to shoe; weight bearing as tolerated, range of motion as tolerated    Mechanism of Injury: gouty    Pertinent Medical/Surgical History:   Past Medical History   Diagnosis Date   . Obesity, unspecified 11/24/2008   . Unspecified asthma, with status asthmaticus 11/24/2008   . Generalized osteoarthrosis, unspecified site 11/24/2008   . Other and unspecified hyperlipidemia 11/24/2008   . Urinary complications 08/25/8525     Date of Surgery:  01/01/2015 - Right secondary repair Achilles tendon tear/tendinopathy.   ____________________________________________________________________  Previous Therapy: prior PT was helpful    Prior Level of Function:  Before onset of the current condition, the patient was able to wear all types of shoes and boots without worse pain in feet      Home Environment: stairs necessary at home    SUBJECTIVE:  Patient's Statement: Pt. Reports he was having trouble wearing his boots because of the bump on his heel that was painful.  He needed it removed    OBJECTIVE MEASURES / IMPAIRMENTS:  Edema: minimal--wearing compression sock  Sensation to light touch: intact--slight hypersensitivity along 2nd metatarsal head  Circulation: intact  Scar mobility: adhered along length--thickened tissue    Ankle/Foot ROM (Values are in degrees)   Right side   Ankle Dorsiflexion knee extended 10   Ankle Dorsiflexion knee flexed 15   Ankle Plantarflexion 50   Ankle Eversion 30   Ankle Inversion 30   Great Toe DF/PF 50/20       Ankle/Foot Strength  Tibialis Anterior: 5  Tibialis  Posterior: 4  Fibularis Longus: 5  Extensor Hallucis Longus: 5  Flexor Hallucis Longus: 5  Extensor Digitorum Longus: 5  Flexor Digitorum Longus: 5  Gastroc (nonweightbearing): 5    Ankle Mobility WNL       Gait: ambulating in CAM boot with mild lateral trunk shift        TREATMENT & EDUCATION   97001 Physical therapy evaluation for 45 minutes (Untimed Code)         Primary Learner: Pt. self  Topics Taught: stretching, self mobilization, AROM, proprioceptive training  Challenges Impacting this Teaching: Pain  Desire and Motivation to Learn: Engaging with education, ask questions and Reports ready to learn/make changes  Post Education Response:  Demonstrates tasks independently and States understanding                 Time in:   1118  Total Treatment Minutes:   45   Total Timed Code Minutes:  0      Home Exercise Program   Sitting: scar mobilization  --AROM all motions  --towel pick up  --stretching with DF by sliding foot back  Standing: SL balance R  --DL heel raises     a handout was provided describing the exercises in  written and picture format  the patient was able to return demonstrate the exercises accurately after instructions    ASSESSMENT:    Dev is a 64 year old man presenting to PT after having an achilles tendinosis excision 01/01/2015.  He now is cleared for full wt. Bearing, motion and strengthening as well as to wean from his boot.  PT instructed him in all appropriate exercises and self care today.  He tolerated all well and is pleased to be able to walk on his foot again.  Follow up weekly is indicated to assist in his recovery of full motion and use of his lower extremity.        Outcomes Score: [facility]ctions: Functional Index Score NA   Primary Functional Assessment Tool for G Coding:   lower extremity taos--incomplete    FALL RISK:    No fall risk based on screening       PLAN:   Therapy Treatment Plan: Patient will be seen for 1 visits per week   until plan of care reviewed.    Plan for next  visit:  Assist with scar mobilization; gym exercise with range of motion, stretching and strengthening    To improve impairments and reach functional goals the following interventions will be performed: therapeutic exercise, therapeutic functional activity, neuromuscular re-education, gait training, manual therapy and patient education    DISCHARGE GOALS - Anticipated Discharge Date 03/31/2105  1. Pt. Will be able to wear regular boots as desired   2. Pt. Will be tolerant of hiking and walking up and down hills with shoes and boots    Current Level of Function  NEW Short Term Goals     Date Updated:  01/30/2015 Date to be Reviewed:  03/01/15 MET / [facility] [place]   Unable to walk in regular shoe for all needs   Pt. Will be able to walk in regular shoe for all needs daily    Weakness of R gastroc with inability to perform SL heel raise   Pt. Will be able to perform SL heel raise R x 1          Rehabilitation potential is: Good    Potential Barriers to achieve rehab goals:  Chronicity or severity    These goals were discussed and Patient agrees with plan.    Cultural Practices that Influence Care: none    Miquel Dunn PT, DPT  [facility]  [facility]  Phone: [phone]   FAX: [phone]

## 2015-02-06 ENCOUNTER — Other Ambulatory Visit (HOSPITAL_BASED_OUTPATIENT_CLINIC_OR_DEPARTMENT_OTHER): Payer: Self-pay

## 2015-02-06 ENCOUNTER — Ambulatory Visit (HOSPITAL_BASED_OUTPATIENT_CLINIC_OR_DEPARTMENT_OTHER): Payer: 59 | Admitting: Rehabilitative and Restorative Service Providers"

## 2015-02-06 DIAGNOSIS — M6788 Other specified disorders of synovium and tendon, other site: Secondary | ICD-10-CM

## 2015-02-06 NOTE — Progress Notes (Signed)
PHYSICAL THERAPY TREATMENT NOTE  __________________________________________________________________      Certification From: 5/62/1308 Through: 03/01/15    ONSET DATE: 01/01/2015  START OF CARE DATE: 01/30/2015    VISITS FROM SOC: 2      INTERPRETER STATUS (Not needed, Telephonic, In Person):not needed   Referring Provider: Jerilynn Som     Diagnosis:   Encounter Diagnosis    Name  Primary?    .  Achilles tendinosis of right lower extremity  Yes        Date of Surgery:  01/01/2015 - Right secondary repair Achilles tendon tear/tendinopathy.   ____________________________________________________________________  Previous Therapy: prior PT was helpful    Prior Level of Function: Before onset of the current condition, the patient was able to wear all types of shoes and boots without worse pain in feet     Home Environment: stairs necessary at home               SUBJECTIVE:  Patient's Statement: He reports things are going well.  Swelling and pain have been down day by day.  He has been doing his home program as he was instructed.  He is happy about the surgical scar is starting to settle down a lot(flattening out).  His R 2nd and 3rd toes are still sensitive with pressure and weight shift for push off with walking    OBJECTIVE MEASURES/IMPAIRMENTS:  R ankle AROM knee ext 3 degree from neutral  R ankle AROM knee flex 7 degree from neutral    TREATMENT & EDUCATION:    Intervention 1:   97110 Therapeutic exercise for 45 minutes                    Reviewed current HEP:  Sitting: scar mobilization---Reviewed verbally  --AROM all motions/alphabet--good demo,   --towel pick up--verbally reviewed  --stretching with DF by sliding foot back--verbally reviewed  Standing: SL balance R--advanced up to 20 sec  --DL heel raises--good demo  Self toe mobilization/self manual toe stretch--2nd and 3rd toes    Tilt board sagital and frontal plane: static standing balance and rocking with core  Standing gastroc stretch 2 x 30 sec  SLS on  R L hip abd and ext 2 x 10 reps    Ice on his R ankle/achilis x 10 min(not included in treatment time)    Assist with scar mobilization; gym exercise with range of motion, stretching and strengthening    Patient's learning needs, abilities, preferences and readiness per Initial POC were considered in this session.  Learning verified by teach back.    Time in: 1450  Total Treatment Minutes:   45  Total Timed Code Minutes:  [place] (HEP):   Sitting: scar mobilization  --AROM all motions  --towel pick up  --stretching with DF by sliding foot back  Standing: SL balance R  --DL heel raises     7/19:  Added R SLS L hip abd and ext with core     a handout was provided describing the exercises in written and picture format     Assessment / Patient Response to Therapy:   Excellent home program compliance and demonstrated good improvement with increased weight bearing on his right foot today.  Will benefit from continued PT for further functional strength and balance training toward his goals.         PLAN:    Continue per treatment plan of care.  Pain will be addressed at  the next plan of care review, and intermittently during daily visits as deemed appropriate by the provider.  The focus of daily visits will be on function    Plan for next visit:  gym exercise with range of motion, stretching and strengthening       Doyce Loose, PTA  [facility]  [facility]  Phone: [phone]   FAX: [phone]    I agree with the above treatment, assessment and plan as written.    Miquel Dunn, PT, DPT

## 2015-02-13 ENCOUNTER — Ambulatory Visit (HOSPITAL_BASED_OUTPATIENT_CLINIC_OR_DEPARTMENT_OTHER): Payer: 59 | Admitting: Rehabilitative and Restorative Service Providers"

## 2015-02-13 DIAGNOSIS — M6788 Other specified disorders of synovium and tendon, other site: Secondary | ICD-10-CM

## 2015-02-13 NOTE — Progress Notes (Signed)
PHYSICAL THERAPY TREATMENT NOTE  __________________________________________________________________      Certification From: 0/86/5784 Through: 03/01/15    ONSET DATE: 01/01/2015  START OF CARE DATE: 01/30/2015    VISITS FROM SOC: 3      INTERPRETER STATUS (Not needed, Telephonic, In Person):not needed   Referring Provider: Jerilynn Som     Diagnosis:   Encounter Diagnosis    Name  Primary?    .  Achilles tendinosis of right lower extremity  Yes        Date of Surgery:  01/01/2015 - Right secondary repair Achilles tendon tear/tendinopathy.   ____________________________________________________________________  Previous Therapy: prior PT was helpful    Prior Level of Function: Before onset of the current condition, the patient was able to wear all types of shoes and boots without worse pain in feet     Home Environment: stairs necessary at home               SUBJECTIVE:  Patient's Statement: He report the swelling is almost gone.  He stopped worrying about his toes have spaces in between.  Things are going pretty good.     OBJECTIVE MEASURES/IMPAIRMENTS:  Not tested today    TREATMENT & EDUCATION:    Intervention 1:   97110 Therapeutic exercise for 30 minutes                    SLS up to 20 sec--needing occasional UE support  SLS hip abd and ext--good demo--added green T band   Step ups with UE support  Standing uni heel raises with UE support      Patient's learning needs, abilities, preferences and readiness per Initial POC were considered in this session.  Learning verified by teach back.    Time in: 1450  Total Treatment Minutes:   45  Total Timed Code Minutes:  [place] (HEP):        7/19:       Updated 7/26:  Continue standing gastroc stretches  SLS up to 20 sec--needing occasional UE support  SLS hip abd and ext--good demo--added green T band   Step ups with UE support  Standing uni heel raises with UE support    a handout was provided describing the exercises in written and picture format      Assessment / Patient Response to Therapy:   Continue to show good home program compliance and showed steady progress with functional strength gain.  Will benefit from continued PT for further functional strength and balance training toward his goals.     PLAN:    Continue per treatment plan of care.  Pain will be addressed at the next plan of care review, and intermittently during daily visits as deemed appropriate by the provider.  The focus of daily visits will be on function    Plan for next visit:  Try eleptical, check advanced HEP     Doyce Loose, PTA  [facility]  [facility]  Phone: [phone]   FAX: [phone]    I have reviewed this note and agree with the treatment plan as written.   Vista Deck, PT

## 2015-02-15 ENCOUNTER — Ambulatory Visit (HOSPITAL_BASED_OUTPATIENT_CLINIC_OR_DEPARTMENT_OTHER): Payer: 59

## 2015-02-15 VITALS — BP 117/79 | HR 45 | Resp 16 | Ht 62.0 in | Wt 220.0 lb

## 2015-02-15 DIAGNOSIS — M1A9XX1 Chronic gout, unspecified, with tophus (tophi): Secondary | ICD-10-CM | POA: Insufficient documentation

## 2015-02-15 NOTE — Progress Notes (Signed)
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  He reports is doing well Ace continues to do physical therapy and is happy at this progress so far.  He is working on scar massage.  He has no complaints.  He is back to most of his normal activities.    Physical exam  Gen.: Patient is in no acute distress  No pain  Filed Vitals:    02/15/15 1315   BP: 117/79   Pulse: 45   Resp: 16   Height: 5\' 2"  (1.575 m)   Weight: 220 lb (99.791 kg)    focused exam of right lower extremity: Sensation grossly intact throughout the foot.  His Achilles incision is well-healed but somewhat hypertrophic.  He has no tenderness palpation.  5 out of 5 strength ankle dorsiflexion plantarflexion.    Assessment plan  64 year old male 6 weeks status post the aforementioned procedure, doing well.  Return to all activities without restrictions and follow-up with [place] as needed.    Glennie Hawk, MD  [facility]

## 2015-02-20 ENCOUNTER — Encounter (HOSPITAL_BASED_OUTPATIENT_CLINIC_OR_DEPARTMENT_OTHER): Payer: 59 | Admitting: Rehabilitative and Restorative Service Providers"

## 2015-02-22 ENCOUNTER — Other Ambulatory Visit (HOSPITAL_BASED_OUTPATIENT_CLINIC_OR_DEPARTMENT_OTHER): Payer: Self-pay | Admitting: Internal Medicine

## 2015-02-22 DIAGNOSIS — E785 Hyperlipidemia, unspecified: Secondary | ICD-10-CM

## 2015-02-22 DIAGNOSIS — R7303 Prediabetes: Secondary | ICD-10-CM

## 2015-02-22 MED ORDER — ATORVASTATIN CALCIUM 40 MG OR TABS
ORAL_TABLET | ORAL | Status: DC
Start: 2015-02-22 — End: 2015-05-01

## 2015-02-27 ENCOUNTER — Encounter (HOSPITAL_BASED_OUTPATIENT_CLINIC_OR_DEPARTMENT_OTHER): Payer: 59 | Admitting: Rehabilitative and Restorative Service Providers"

## 2015-03-07 ENCOUNTER — Encounter (HOSPITAL_BASED_OUTPATIENT_CLINIC_OR_DEPARTMENT_OTHER): Payer: 59 | Admitting: Rehabilitative and Restorative Service Providers"

## 2015-03-07 NOTE — Progress Notes (Signed)
Erroneous encounter

## 2015-05-01 ENCOUNTER — Encounter (HOSPITAL_BASED_OUTPATIENT_CLINIC_OR_DEPARTMENT_OTHER): Payer: Self-pay | Admitting: Internal Medicine

## 2015-05-01 ENCOUNTER — Ambulatory Visit (HOSPITAL_BASED_OUTPATIENT_CLINIC_OR_DEPARTMENT_OTHER): Payer: 59 | Attending: Internal Medicine | Admitting: Internal Medicine

## 2015-05-01 VITALS — BP 133/80 | HR 48 | Temp 96.8°F | Ht 62.0 in | Wt 237.0 lb

## 2015-05-01 DIAGNOSIS — E785 Hyperlipidemia, unspecified: Secondary | ICD-10-CM | POA: Insufficient documentation

## 2015-05-01 DIAGNOSIS — R7303 Prediabetes: Secondary | ICD-10-CM | POA: Insufficient documentation

## 2015-05-01 DIAGNOSIS — J309 Allergic rhinitis, unspecified: Secondary | ICD-10-CM | POA: Insufficient documentation

## 2015-05-01 DIAGNOSIS — N289 Disorder of kidney and ureter, unspecified: Secondary | ICD-10-CM | POA: Insufficient documentation

## 2015-05-01 DIAGNOSIS — Z0000 Encounter for general adult medical examination without abnormal findings: Secondary | ICD-10-CM | POA: Insufficient documentation

## 2015-05-01 DIAGNOSIS — B350 Tinea barbae and tinea capitis: Secondary | ICD-10-CM | POA: Insufficient documentation

## 2015-05-01 DIAGNOSIS — M109 Gout, unspecified: Secondary | ICD-10-CM | POA: Insufficient documentation

## 2015-05-01 DIAGNOSIS — Z23 Encounter for immunization: Secondary | ICD-10-CM | POA: Insufficient documentation

## 2015-05-01 DIAGNOSIS — J45902 Unspecified asthma with status asthmaticus: Secondary | ICD-10-CM | POA: Insufficient documentation

## 2015-05-01 DIAGNOSIS — R21 Rash and other nonspecific skin eruption: Secondary | ICD-10-CM | POA: Insufficient documentation

## 2015-05-01 LAB — BASIC METABOLIC PANEL
Anion Gap: 7 (ref 4–12)
Calcium: 9.5 mg/dL (ref 8.9–10.2)
Carbon Dioxide, Total: 27 meq/L (ref 22–32)
Chloride: 107 meq/L (ref 98–108)
Creatinine: 1.27 mg/dL — ABNORMAL HIGH (ref 0.51–1.18)
GFR, Calc, African American: >60 mL/min (ref >59)
GFR, Calc, European American: 57 mL/min — ABNORMAL LOW (ref >59)
Glucose: 91 mg/dL (ref 62–125)
Potassium: 3.8 meq/L (ref 3.6–5.2)
Sodium: 141 meq/L (ref 135–145)
Urea Nitrogen: 23 mg/dL — ABNORMAL HIGH (ref 8–21)

## 2015-05-01 LAB — URIC ACID, SERUM: Uric Acid: 9 mg/dL — ABNORMAL HIGH (ref 3.9–7.6)

## 2015-05-01 MED ORDER — KETOCONAZOLE 2 % EX SHAM
MEDICATED_SHAMPOO | CUTANEOUS | Status: DC
Start: 2015-05-01 — End: 2016-09-19

## 2015-05-01 MED ORDER — FLUTICASONE PROPIONATE 50 MCG/ACT NA SUSP
2.0000 | Freq: Every day | NASAL | Status: DC
Start: 2015-05-01 — End: 2016-02-22

## 2015-05-01 MED ORDER — FLUTICASONE PROPIONATE HFA 110 MCG/ACT IN AERO
2.0000 | INHALATION_SPRAY | Freq: Two times a day (BID) | RESPIRATORY_TRACT | Status: DC
Start: 2015-05-01 — End: 2015-05-01

## 2015-05-01 MED ORDER — ATORVASTATIN CALCIUM 40 MG OR TABS
40.0000 mg | ORAL_TABLET | Freq: Every evening | ORAL | Status: DC
Start: 2015-05-01 — End: 2015-11-22

## 2015-05-01 MED ORDER — FLUNISOLIDE HFA 80 MCG/ACT IN AERS
2.0000 | INHALATION_SPRAY | Freq: Two times a day (BID) | RESPIRATORY_TRACT | Status: DC
Start: 2015-05-01 — End: 2015-05-03

## 2015-05-01 MED ORDER — TRIAMCINOLONE ACETONIDE 0.1 % EX OINT
TOPICAL_OINTMENT | Freq: Two times a day (BID) | CUTANEOUS | Status: DC
Start: 2015-05-01 — End: 2016-07-02

## 2015-05-01 MED ORDER — ALLOPURINOL 100 MG OR TABS
100.0000 mg | ORAL_TABLET | Freq: Every day | ORAL | Status: DC
Start: 2015-05-01 — End: 2015-08-29

## 2015-05-01 MED ORDER — ALBUTEROL SULFATE HFA 108 (90 BASE) MCG/ACT IN AERS
2.0000 | INHALATION_SPRAY | Freq: Four times a day (QID) | RESPIRATORY_TRACT | Status: DC | PRN
Start: 2015-05-01 — End: 2017-04-27

## 2015-05-01 MED ORDER — ASPIRIN 81 MG OR TBEC
81.0000 mg | DELAYED_RELEASE_TABLET | Freq: Every day | ORAL | Status: DC
Start: 2015-05-01 — End: 2016-05-04

## 2015-05-01 NOTE — Progress Notes (Signed)
VACCINE SCREENING/ORDER WHEN CONTRAINDICATIONS PRESENT    Order date: 05/01/2015  Ordering provider: NAVABI,NAZLEE  Clinic stock used: YES   Interpreter used?: None  Vaccine information sheet(s) discussed, patient/parent/guardian verbalized understanding? YES   Vaccines given: INFLUENZA VACCINE  VIS given 05/01/2015 by Marlana Salvage, MA .    SCREENING: INACTIVATED VACCINES  NO 1. Do you have a high fever, severe cold-like symptoms or serious infection?  NO 2. Do you have any food allergies such as eggs, chicken, chicken feathers, chicken dander, or gelatin?  NO 3. Do you have any allergies to neomycin, streptomycin, or polymyxin?   NO 4. Have you had any severe reaction to a vaccine in the past such as a seizure, a convulsion from a high fever, or a paralysis problem called Guillain-Barre Syndrome? If so, which vaccine(s):     If the patient/parent/or guardian answered "yes" to any of the above questions, consult with the provider to review the responses prior to administering vaccination. Parents with a contraindication to immunization will not receive the immunization without a specific physician order to vaccinate the patient and discussion of risk and benefits related to the contraindication.     Physician Order for Administration of Vaccine(s) When Contraindications Are Present  I have reviewed the existence in this patient's health history of possible contraindication(s) to administration of vaccine. The benefits to this patient outweigh the potential risks of immunization. Administer vaccine(s):  Marlana Salvage, MA, 05/01/2015 3:44 PM MA.

## 2015-05-01 NOTE — Patient Instructions (Addendum)
You set the goal to get at or under 200 pounds by next visit in 4-5 months.    Please go get labs today.

## 2015-05-01 NOTE — Progress Notes (Signed)
[facility]      IDENTIFICATION  Nathan [name] is a 64 year old male who presents today for follow up    Patient Active Problem List   Diagnosis   . Obesity, unspecified   . Unspecified asthma, with status asthmaticus   . Generalized osteoarthrosis, unspecified site   . Other and unspecified hyperlipidemia   . Urinary complications   . Presbyopia   . Gout   . Ankle pain, R    . Skin tag   . Irregular heart beat   . Pain in finger of left hand   . Achilles tendinosis of right lower extremity   . Orthopedic aftercare     Outpatient Prescriptions Prior to Visit   Medication Sig Dispense Refill   . Albuterol Sulfate HFA 108 (90 BASE) MCG/ACT Inhalation Aero Soln Inhale 2 puffs by mouth every 6 hours as needed. For shortness of breath. 1 Inhaler 12   . ASPIRIN EC LOW DOSE 81 MG Oral Tab EC Take 1 tablet by mouth 1 time a day to prevent heart attack/stroke 30 tablet 11   . Atorvastatin Calcium 40 MG Oral Tab Take 1 tablet (40 mg) by mouth every evening. To lower cholesterol. 30 tablet 5   . COLCRYS 0.6 MG Oral Tab Take 2 tablets by mouth at first sign of flare followed by 1 tablet 1 hour later. 30 tablet 0   . Fluticasone Propionate  HFA 110 MCG/ACT Inhalation Aerosol Inhale 2 puffs by mouth every 12 hours. Use with spacer chamber. 1 Inhaler 12   . Fluticasone Propionate 50 MCG/ACT Nasal Suspension Spray 2 sprays into each nostril daily. 1 Inhaler 12   . Ketoconazole 2 % External Shampoo lather daily 120 mL 12   . Loratadine 10 MG Oral Tab Take 1 tablet (10 mg) by mouth daily. For allergies. 30 tablet 5     No facility-administered medications prior to visit.       Subjective:     History of Present Illness:     States he needs more shampoo, triamcinolone for bumps on back of his head and colchicine. Recent increase in his colchicine dose.   Also needing inhaler and nasal spray.    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    Rarely needs albuterol. Feels short of breath gong up hills. Gets wheezey.      No chest pain, no chest pressure. Occasional cough. No swelling in legs. No fever, chills.     He notices that his weight has gone up. He was told to exercise. He needs to stop procrastinating on lifestyle changes he says.       Objective:     BP 133/80 mmHg  Pulse 48  Temp(Src) 96.8 F (36 C) (Temporal)  Ht 5\' 2"  (1.575 m)  Wt 237 lb (107.502 kg)  BMI 43.34 kg/m2       BP Readings from Last 5 Encounters:   02/15/15 117/79   01/25/15 125/78   01/18/15 128/62   12/21/14 123/62   11/14/14 131/91     Wt Readings from Last 5 Encounters:   02/15/15 220 lb (99.791 kg)   01/25/15 220 lb (99.791 kg)   01/18/15 220 lb (99.791 kg)   12/21/14 238 lb (107.956 kg)   11/14/14 238 lb (107.956 kg)         Exam:    General: no acute distress  Eyes: anicteric  Resp: normal resp effort  Neuro: alert and conversing, normal gait  Assessment and Plan:    He set goal to change diet and start exercising more in order to lose weight.     1. Gout of ankle, unspecified cause, unspecified chronicity, unspecified laterality  - URIC ACID, SERUM  - BASIC METABOLIC PANEL  - Allopurinol 100 MG Oral Tab; Take 1 tablet (100 mg) by mouth daily.  Dispense: 30 tablet; Refill: 3    2. 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 Flu vaccine need  - influenza vaccine quadrivalent PF (adult) 0.5 mL IM - [id number]    4. ASTHMA NOS W STATUS ASTHMATICUS  Refilling his albuterol.   - Albuterol Sulfate HFA 108 (90 BASE) MCG/ACT Inhalation Aero Soln; Inhale 2 puffs by mouth every 6 hours as needed for shortness of breath/wheezing. For shortness of breath.  Dispense: 1 Inhaler; Refill: 12    5. Health care maintenance  -  Aspirin (ASPIRIN EC LOW DOSE) 81 MG Oral Tab EC; Take 1 tablet (81 mg) by mouth daily. To prevent heart attack/stroke.  Dispense: 30 tablet; Refill: 11    6. Hyperlipidemia, unspecified hyperlipidemia type  - Atorvastatin Calcium 40 MG Oral Tab; Take 1 tablet (40 mg) by mouth every evening. To lower Cholesterol.  Dispense: 30 tablet; Refill: 5    7. Pre-diabetes  - Atorvastatin Calcium 40 MG Oral Tab; Take 1 tablet (40 mg) by mouth every evening. To lower Cholesterol.  Dispense: 30 tablet; Refill: 5    8. Allergic rhinitis, unspecified allergic rhinitis trigger, unspecified rhinitis seasonality  - Fluticasone Propionate 50 MCG/ACT Nasal Suspension; Spray 2 sprays into each nostril daily.  Dispense: 1 Inhaler; Refill: 12    9. Tinea capitis  - Ketoconazole 2 % External Shampoo; lather daily  Dispense: 120 mL; Refill: 12    10. Localized papular rash  - Triamcinolone Acetonide 0.1 % External Ointment; Apply to affected area on neck 2 times a day.  Dispense: 15 g; Refill: 3      HCM  Cardiovascular   The 10-year ASCVD risk score Mikey Bussing DC Jr., et al., 2013) is: 7.2%    Values used to calculate the score:      Age: 61 years      Sex: Male      Is an African American: Yes      Diabetic: No      Tobacco smoker: No      Systolic Blood Pressure: 151 mmHg      Prescribed Antihypertensives: No      HDL Cholesterol: 43 mg/dL      Total Cholesterol: 143 mg/dL    --HA1C 6.2% (june 2016)  --on aspirin, on atorvastatin  Serologies/immunizations  --s/p Tdap July 2012  --hep C negative (dec 2013)  --HIV (dec 2013)  Cancer screening  --2016 FOBT negative      Follow up: 4-5 months  Patient discussed with Dr. Ardelle Park      Supplemental information

## 2015-05-03 ENCOUNTER — Telehealth (HOSPITAL_BASED_OUTPATIENT_CLINIC_OR_DEPARTMENT_OTHER): Payer: Self-pay | Admitting: Internal Medicine

## 2015-05-03 ENCOUNTER — Other Ambulatory Visit (HOSPITAL_BASED_OUTPATIENT_CLINIC_OR_DEPARTMENT_OTHER): Payer: Self-pay | Admitting: Internal Medicine

## 2015-05-03 DIAGNOSIS — J45902 Unspecified asthma with status asthmaticus: Secondary | ICD-10-CM

## 2015-05-03 MED ORDER — FLUTICASONE FUROATE 100 MCG/ACT IN AEPB
1.0000 | INHALATION_SPRAY | Freq: Every day | RESPIRATORY_TRACT | Status: DC
Start: 2015-05-03 — End: 2015-11-07

## 2015-05-03 NOTE — Telephone Encounter (Signed)
-----   Message from Ivar Bury, [place] sent at 05/02/2015  4:47 PM PDT -----  Regarding: FW: Flovent not covered      ----- Message -----     From: Mittie Bodo     Sent: 05/01/2015   5:57 PM       To: Ortencia Kick, MD, [place] [facility]  Subject: Flovent not covered                              Hi,     Pt's insurance no longer pays for Flovent. Will have to change to Aerospan or Arnuity ellipta. Can a new erx be sent to [facility]? Thank you!    [place] [facility]  [id number]

## 2015-05-03 NOTE — Telephone Encounter (Signed)
Informed that Nathan [name] also not covered.

## 2015-05-13 NOTE — Progress Notes (Signed)
ATTENDING NOTE  I have personally discussed the case with Nazlee  Navabi, MD during or immediately after the patient visit including review of history, physical exam, diagnosis, and treatment plan. I agree with the assessment and plan of care.

## 2015-06-27 ENCOUNTER — Ambulatory Visit (HOSPITAL_BASED_OUTPATIENT_CLINIC_OR_DEPARTMENT_OTHER): Payer: 59 | Attending: Internal Medicine | Admitting: Unknown Physician Specialty

## 2015-06-27 ENCOUNTER — Ambulatory Visit: Payer: 59

## 2015-06-27 VITALS — BP 135/90 | HR 80 | Temp 97.3°F | Resp 18

## 2015-06-27 DIAGNOSIS — R2241 Localized swelling, mass and lump, right lower limb: Secondary | ICD-10-CM | POA: Insufficient documentation

## 2015-06-27 DIAGNOSIS — M7661 Achilles tendinitis, right leg: Secondary | ICD-10-CM

## 2015-06-27 DIAGNOSIS — M25471 Effusion, right ankle: Secondary | ICD-10-CM

## 2015-06-27 DIAGNOSIS — M6788 Other specified disorders of synovium and tendon, other site: Secondary | ICD-10-CM

## 2015-06-27 NOTE — Progress Notes (Signed)
Pt presents with swelling to R heel area of Achilles tendon. Vitals WNL  BP 135/90 mmHg  Pulse 80  Temp(Src) 97.3 F (36.3 C) (Temporal)  Resp 18    Pt reported approx one week of irritation and intermittent swelling to tendon. No warmth, tenderness, redness, scant swelling to foot/leg.  Area of swelling to tendon approx 3cm diameter hight 1cm.  Soft and tender to touch.    Icing and using APAP for discomfort, described as "headache in foot" with some relief. Pt reported elevating LE. Wore cam boot into clinic in effort to offload due to discomfort to area of swelling.     Assessed by attending Physician.  Called foot and ankle, appointment with surgeon Brage next Thursday per Pt availability and ultrasound scheduled for immediately after triage visit. Pt given AVS with appts and reminded of return precautions and home care.  Expressed understanding. Will go to radiology check in at this time.

## 2015-06-27 NOTE — Progress Notes (Signed)
Asked to see patient for about 6 days of acute right ankle swelling.  Had been wearing boots tightly for about 9 hours last Thursday and was walking extensively.  Upon removal noted posterior swelling at base of Achilles.     Extensive swelling in egg-sized at base/ insertion of Achilles. NOt warm, but some fluctuance. No redness.     Recommended icing x 48-72 hours, and will obtain u/s of the area.  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  Advised patient to elevate as well as much as possible.      Considered possible drainage with needle, but given recent surgery there and location near bottom of foot would prefer assessment by foot and ankle providers first.

## 2015-06-28 ENCOUNTER — Encounter (HOSPITAL_BASED_OUTPATIENT_CLINIC_OR_DEPARTMENT_OTHER): Payer: Self-pay | Admitting: Orthopedic Surgery

## 2015-07-05 ENCOUNTER — Telehealth (HOSPITAL_BASED_OUTPATIENT_CLINIC_OR_DEPARTMENT_OTHER): Payer: Self-pay | Admitting: Internal Medicine

## 2015-07-05 ENCOUNTER — Ambulatory Visit (HOSPITAL_BASED_OUTPATIENT_CLINIC_OR_DEPARTMENT_OTHER): Payer: 59 | Admitting: Orthopedic Surgery

## 2015-07-05 ENCOUNTER — Encounter (HOSPITAL_BASED_OUTPATIENT_CLINIC_OR_DEPARTMENT_OTHER): Payer: Self-pay | Admitting: Orthopedic Surgery

## 2015-07-05 VITALS — BP 133/88 | HR 63 | Resp 16

## 2015-07-05 DIAGNOSIS — M1A09X0 Idiopathic chronic gout, multiple sites, without tophus (tophi): Secondary | ICD-10-CM | POA: Insufficient documentation

## 2015-07-05 DIAGNOSIS — M7661 Achilles tendinitis, right leg: Secondary | ICD-10-CM | POA: Insufficient documentation

## 2015-07-05 DIAGNOSIS — M6788 Other specified disorders of synovium and tendon, other site: Secondary | ICD-10-CM

## 2015-07-05 NOTE — Progress Notes (Signed)
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  He said he did well initially postoperatively.  Approximately 2 weeks ago he wore boots that he tightened too much around the ankle and developed an area of rubbing and swelling over his previous surgical area.  He was evaluated by a physician and was sent to see [place] for follow-up.  He has not had any fever chills nausea vomiting or drainage or other signs of infection.  He says with elevation icing and shoes that do not press on the area that is gradually improving.  He is unsure if he is seen his rheumatologist recently.    REVIEW OF SYSTEMS:  Pertinent positives listed in the HPI.     PHYSICAL EXAM:   Filed Vitals:    07/05/15 1312   BP: 133/88   Pulse: 63   Resp: 16     General: No apparent distress  Skin: Examination of the Right foot reveals 3 x 3 cm area of fluctuance along the posterior border of the Achilles tendon.  Not significantly tender.  No erythema or drainage.  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  He has a 3 x 3 cm area of fluctuance at his area of previous surgery.  This is gradually improving with conservative care.  We are not concerned with infection at this point.  We do not have any surgical solution to his problem at this point.  We would encourage him to continue follow-up with his rheumatologist to adjust his medications    PLAN:  1.  Wear compression stockings and shoe wear that does not rub on his Achilles tendon  2.  Follow-up with his rheumatologist  regarding his medications  3.  Follow-up on an as-needed basis

## 2015-07-05 NOTE — Telephone Encounter (Signed)
Patient came in to request sooner appointment. Next available with PCP, Dr. Corky Sox, is in April.  Offered an acute visit with another provider, but patient declined and only wants to see her. Sending message to Dr. Corky Sox for advice.

## 2015-08-29 ENCOUNTER — Other Ambulatory Visit (HOSPITAL_BASED_OUTPATIENT_CLINIC_OR_DEPARTMENT_OTHER): Payer: Self-pay | Admitting: Internal Medicine

## 2015-08-29 DIAGNOSIS — M109 Gout, unspecified: Secondary | ICD-10-CM

## 2015-08-30 MED ORDER — ALLOPURINOL 100 MG OR TABS
ORAL_TABLET | ORAL | Status: DC
Start: 2015-08-30 — End: 2015-11-22

## 2015-11-07 ENCOUNTER — Ambulatory Visit (HOSPITAL_BASED_OUTPATIENT_CLINIC_OR_DEPARTMENT_OTHER): Payer: 59 | Attending: Internal Medicine | Admitting: Internal Medicine

## 2015-11-07 VITALS — BP 134/82 | HR 59 | Temp 97.2°F | Resp 18 | Ht 62.0 in

## 2015-11-07 DIAGNOSIS — J45902 Unspecified asthma with status asthmaticus: Secondary | ICD-10-CM | POA: Insufficient documentation

## 2015-11-07 DIAGNOSIS — M109 Gout, unspecified: Secondary | ICD-10-CM | POA: Insufficient documentation

## 2015-11-07 DIAGNOSIS — Z139 Encounter for screening, unspecified: Secondary | ICD-10-CM | POA: Insufficient documentation

## 2015-11-07 DIAGNOSIS — E785 Hyperlipidemia, unspecified: Secondary | ICD-10-CM | POA: Insufficient documentation

## 2015-11-07 DIAGNOSIS — Z131 Encounter for screening for diabetes mellitus: Secondary | ICD-10-CM | POA: Insufficient documentation

## 2015-11-07 LAB — COMPREHENSIVE METABOLIC PANEL
ALT (GPT): 21 U/L (ref 10–48)
AST (GOT): 19 U/L (ref 9–38)
Albumin: 4.2 g/dL (ref 3.5–5.2)
Alkaline Phosphatase (Total): 86 U/L (ref 37–159)
Anion Gap: 6 (ref 4–12)
Bilirubin (Total): 0.8 mg/dL (ref 0.2–1.3)
Calcium: 9.3 mg/dL (ref 8.9–10.2)
Carbon Dioxide, Total: 30 meq/L (ref 22–32)
Chloride: 106 meq/L (ref 98–108)
Creatinine: 1.24 mg/dL — ABNORMAL HIGH (ref 0.51–1.18)
GFR, Calc, African American: >60 mL/min/{1.73_m2}
GFR, Calc, European American: 59 mL/min/{1.73_m2}
Glucose: 87 mg/dL (ref 62–125)
Potassium: 4.2 meq/L (ref 3.6–5.2)
Protein (Total): 7.5 g/dL (ref 6.0–8.2)
Sodium: 142 meq/L (ref 135–145)
Urea Nitrogen: 12 mg/dL (ref 8–21)

## 2015-11-07 LAB — URIC ACID, SERUM: Uric Acid: 6.3 mg/dL (ref 3.9–7.6)

## 2015-11-07 LAB — LIPID PANEL
Cholesterol (LDL): 96 mg/dL (ref <130)
Cholesterol/HDL Ratio: 3.6
HDL Cholesterol: 45 mg/dL (ref >39)
Non-HDL Cholesterol: 116 mg/dL (ref 0–159)
Total Cholesterol: 161 mg/dL (ref <200)
Triglyceride: 101 mg/dL (ref <150)

## 2015-11-07 MED ORDER — FLUTICASONE FUROATE 100 MCG/ACT IN AEPB
1.0000 | INHALATION_SPRAY | Freq: Every day | RESPIRATORY_TRACT | Status: DC
Start: 2015-11-07 — End: 2016-02-22

## 2015-11-07 NOTE — Progress Notes (Signed)
IDENTIFICATION  65 year old man presents for follow up    Patient Active Problem List   Diagnosis   . Obesity, unspecified   . Unspecified asthma, with status asthmaticus   . Generalized osteoarthrosis, unspecified site   . Other and unspecified hyperlipidemia   . Presbyopia   . Gout   . Irregular heart beat   . Achilles tendinosis of right lower extremity     Outpatient Prescriptions Prior to Visit   Medication Sig Dispense Refill   . Albuterol Sulfate HFA 108 (90 BASE) MCG/ACT Inhalation Aero Soln Inhale 2 puffs by mouth every 6 hours as needed for shortness of breath/wheezing. For shortness of breath. 1 Inhaler 12   . Allopurinol 100 MG Oral Tab Take 1 tablet (100 mg) by mouth daily. 30 tablet 2   . Aspirin (ASPIRIN EC LOW DOSE) 81 MG Oral Tab EC Take 1 tablet (81 mg) by mouth daily. To prevent heart attack/stroke. 30 tablet 11   . Atorvastatin Calcium 40 MG Oral Tab Take 1 tablet (40 mg) by mouth every evening. To lower Cholesterol. 30 tablet 5   . Fluticasone Furoate (ARNUITY ELLIPTA) 100 MCG/ACT Inhalation AEROSOL POWDER, BREATH ACTIVATED Inhale 1 puff by mouth daily. 1 each 5   . Fluticasone Propionate 50 MCG/ACT Nasal Suspension Spray 2 sprays into each nostril daily. 1 Inhaler 12   . Ketoconazole 2 % External Shampoo lather daily 120 mL 12   . Triamcinolone Acetonide 0.1 % External Ointment Apply to affected area on neck 2 times a day. 15 g 3     No facility-administered medications prior to visit.     SUBJECTIVE    Asthma  --Still having some wheezing. Describes the sound like the sound of an unoiled hinge. States he hears this once a week.  --When he started taking the fluticasone he stopped taking the proair.     Other  --Some blood tinged snot. Thinks it may be related to using a trimmer for his nose hairs  --States he will not bring his meds in because he will often go somewhere else from here.    No palpitations, chest pain. Can walk a mile without getting short of breath.     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     SOCIAL  FAMILY: brothers in [place] and [place]; htn, diabetes run in the family. Father died from stroke or heart attack; mother died from tumor    OBJECTIVE    BP 134/82 mmHg  Pulse 59  Temp(Src) 97.2 F (36.2 C) (Temporal)  Resp 18  Ht 5\' 2"  (1.575 m)    General: well appearing, no acute distress  Eyes: anicteric  HENT: MMM, some bogginess to nares but no evidence of lesions causing bleeding at this time  Respiratory: no increased work of breathing  Neuro: normal gait, alert and conversing    BP Readings from Last 5 Encounters:   07/05/15 133/88   06/27/15 135/90   05/01/15 133/80   02/15/15 117/79   01/25/15 125/78     Wt Readings from Last 5 Encounters:   05/01/15 237 lb (107.502 kg)   02/15/15 220 lb (99.791 kg)   01/25/15 220 lb (99.791 kg)   01/18/15 220 lb (99.791 kg)   12/21/14 238 lb (107.956 kg)         ASSESSMENT/PLAN    1. Unspecified asthma, with status asthmaticus  - Fluticasone Furoate (ARNUITY ELLIPTA) 100 MCG/ACT Inhalation AEROSOL POWDER, BREATH ACTIVATED;  Inhale 1 puff by mouth daily.  Dispense: 1 each; Refill: 5    2. Gout, unspecified cause, unspecified chronicity, unspecified site  - URIC ACID, SERUM  - COMPREHENSIVE METABOLIC PANEL    3. Screening  4. Hyperlipidemia, unspecified hyperlipidemia type  - LIPID PANEL    5. Screening for diabetes mellitus  - HEMOGLOBIN A1C, HPLC    HCM  HCV and HIV negative (2013), at today's visit he states he has not been sexually active for years.  Due for colonoscopy  S/p flu shot oct 2016  S/p hep b vaccination  S/p pneumovax 2010  S/p tdap 2012

## 2015-11-08 LAB — HEMOGLOBIN A1C, HPLC: Hemoglobin A1C: 6 % (ref 4.0–6.0)

## 2015-11-17 NOTE — Progress Notes (Signed)
I have personally discussed the case with the resident during or immediately after the patient visit including review of history, physical exam, diagnosis, and treatment plan. I agree with the assessment and plan of care.

## 2015-11-18 ENCOUNTER — Other Ambulatory Visit (HOSPITAL_BASED_OUTPATIENT_CLINIC_OR_DEPARTMENT_OTHER): Payer: Self-pay | Admitting: Internal Medicine

## 2015-11-18 DIAGNOSIS — J309 Allergic rhinitis, unspecified: Secondary | ICD-10-CM

## 2015-11-19 NOTE — Telephone Encounter (Signed)
Refill request for loratadine requires your authorization because it was discontinued off medication list on 05/01/15 but patient is now requesting a refill.    Reason for discontinuation: not given    30 last filled 08/16/15    If medication is DISCONTINUED please "refuse all" medication requests and have your staff contact the patient.  If medication is to be continued please add refills, sign order and close encounter.

## 2015-11-22 ENCOUNTER — Other Ambulatory Visit (HOSPITAL_BASED_OUTPATIENT_CLINIC_OR_DEPARTMENT_OTHER): Payer: Self-pay | Admitting: Internal Medicine

## 2015-11-22 DIAGNOSIS — R7303 Prediabetes: Secondary | ICD-10-CM

## 2015-11-22 DIAGNOSIS — E785 Hyperlipidemia, unspecified: Secondary | ICD-10-CM

## 2015-11-22 DIAGNOSIS — M109 Gout, unspecified: Secondary | ICD-10-CM

## 2015-11-22 MED ORDER — ALLOPURINOL 100 MG OR TABS
ORAL_TABLET | ORAL | Status: DC
Start: 2015-11-22 — End: 2016-03-05

## 2015-11-22 MED ORDER — ATORVASTATIN CALCIUM 40 MG OR TABS
ORAL_TABLET | ORAL | Status: DC
Start: 2015-11-22 — End: 2016-02-22

## 2015-11-23 NOTE — Telephone Encounter (Signed)
Please respond to prescription request pending since 11/19/15.

## 2015-11-26 MED ORDER — LORATADINE 10 MG OR TABS
ORAL_TABLET | ORAL | Status: DC
Start: 2015-11-26 — End: 2016-09-18

## 2016-02-22 ENCOUNTER — Encounter (HOSPITAL_BASED_OUTPATIENT_CLINIC_OR_DEPARTMENT_OTHER): Payer: Self-pay | Admitting: Internal Medicine

## 2016-02-22 ENCOUNTER — Ambulatory Visit (HOSPITAL_BASED_OUTPATIENT_CLINIC_OR_DEPARTMENT_OTHER): Payer: 59 | Attending: Internal Medicine | Admitting: Internal Medicine

## 2016-02-22 VITALS — BP 138/74 | HR 66 | Temp 96.8°F | Ht 62.0 in | Wt 240.0 lb

## 2016-02-22 DIAGNOSIS — E785 Hyperlipidemia, unspecified: Secondary | ICD-10-CM | POA: Insufficient documentation

## 2016-02-22 DIAGNOSIS — J309 Allergic rhinitis, unspecified: Secondary | ICD-10-CM | POA: Insufficient documentation

## 2016-02-22 DIAGNOSIS — R7303 Prediabetes: Secondary | ICD-10-CM | POA: Insufficient documentation

## 2016-02-22 DIAGNOSIS — N182 Chronic kidney disease, stage 2 (mild): Secondary | ICD-10-CM | POA: Insufficient documentation

## 2016-02-22 DIAGNOSIS — Z6841 Body Mass Index (BMI) 40.0 and over, adult: Secondary | ICD-10-CM

## 2016-02-22 DIAGNOSIS — Z1211 Encounter for screening for malignant neoplasm of colon: Secondary | ICD-10-CM | POA: Insufficient documentation

## 2016-02-22 DIAGNOSIS — J45902 Unspecified asthma with status asthmaticus: Secondary | ICD-10-CM | POA: Insufficient documentation

## 2016-02-22 LAB — PROTEIN/CREATININE RATIO, TIMED URINE
Creatinine/24H, Urine: UNDETERMINED mg/(24.h) (ref 1000–2000)
Creatinine/Unit, Urine: 214 mg/dL
Protein (Total), Urine: 11 mg/dL
Protein/Creatinine Ratio: <0.1 (ref <0.2)
Total Protein/24H, Urine: UNDETERMINED g/d (ref 0.05–0.08)

## 2016-02-22 LAB — BASIC METABOLIC PANEL
Anion Gap: 7 (ref 4–12)
Calcium: 9.8 mg/dL (ref 8.9–10.2)
Carbon Dioxide, Total: 26 meq/L (ref 22–32)
Chloride: 109 meq/L — ABNORMAL HIGH (ref 98–108)
Creatinine: 1.32 mg/dL — ABNORMAL HIGH (ref 0.51–1.18)
GFR, Calc, African American: >60 mL/min/{1.73_m2} (ref >59)
GFR, Calc, European American: 55 mL/min/{1.73_m2} — ABNORMAL LOW (ref >59)
Glucose: 100 mg/dL (ref 62–125)
Potassium: 3.9 meq/L (ref 3.6–5.2)
Sodium: 142 meq/L (ref 135–145)
Urea Nitrogen: 15 mg/dL (ref 8–21)

## 2016-02-22 MED ORDER — ATORVASTATIN CALCIUM 40 MG OR TABS
ORAL_TABLET | ORAL | 11 refills | Status: DC
Start: 2016-02-22 — End: 2016-09-18

## 2016-02-22 MED ORDER — FLUTICASONE PROPIONATE 50 MCG/ACT NA SUSP
2.0000 | Freq: Every day | NASAL | 12 refills | Status: DC
Start: 2016-02-22 — End: 2017-04-20

## 2016-02-22 MED ORDER — FLUTICASONE FUROATE 100 MCG/ACT IN AEPB
1.0000 | INHALATION_SPRAY | Freq: Every day | RESPIRATORY_TRACT | 5 refills | Status: DC
Start: 2016-02-22 — End: 2016-09-18

## 2016-02-22 NOTE — Progress Notes (Signed)
[facility] - Outpatient Initial Clinic Note    DATE: 02/22/2016     Nathan [name] is a 65 year old male who is here today to establish care. An interpreter was not needed for the visit.      CHIEF COMPLAINT: Establish Care and Hyperlipidemia    INTERVAL HISTORY/HPI:  Nathan [name] is a former Dr. Corky [name] patient who presents to re-establish care.  Has been doing well overall, avoiding the sun and smoke as much as possible to minimize potential asthma symptoms.  Has not needed his rescue inhaler, denies any wheezing, no significant cough, denies dyspnea, CP.  Still struggling with weight gain, though this is in the context of continued snacking on cakes and sweets.  Ambulates without issues, no dyspnea on exertion, occasional LE swelling at the end of the day after fishing all day on his feet, resolves by morning.    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  Also does not like the legalization of marijuana, as the smoke is "disgusting" and he doesn't enjoy how "it makes everyone crazy".      PROBLEM LIST:  Patient Active Problem List    Diagnosis Date Noted   . 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 Irregular heart beat 10/05/2012     Sinus brady (56) with frequent premature ventricular complexes in pattern of bigeminy on EKG June 2016     . Gout 05/27/2011   . Obesity, unspecified 11/24/2008   . Unspecified asthma, with status asthmaticus 11/24/2008   . Generalized osteoarthrosis, unspecified site 11/24/2008   . Other and unspecified hyperlipidemia 11/24/2008   . Presbyopia 11/24/2008     PAST MEDICAL HISTORY:  Past Medical History:   Diagnosis Date   . Generalized osteoarthrosis, unspecified site 11/24/2008   . Obesity, unspecified 11/24/2008   . Other and unspecified hyperlipidemia 11/24/2008   . Unspecified asthma, with status asthmaticus 11/24/2008   . Urinary complications [id number]        MEDICATIONS:  Current Outpatient Prescriptions   Medication Sig Dispense Refill   . Albuterol Sulfate HFA 108 (90 BASE) MCG/ACT Inhalation Aero Soln Inhale 2 puffs by mouth every 6 hours as needed for shortness of breath/wheezing. For shortness of breath. 1 Inhaler 12   . Allopurinol 100 MG Oral Tab Take 1 tablet (100 mg) by mouth daily. 30 tablet 2   . Aspirin (ASPIRIN EC LOW DOSE) 81 MG Oral Tab EC Take 1 tablet (81 mg) by mouth daily. To prevent heart attack/stroke. 30 tablet 11   . Atorvastatin Calcium 40 MG Oral Tab Take 1 tablet (40 mg) by mouth every evening. To lower cholesterol. 30 tablet 11   . Fluticasone Furoate (ARNUITY ELLIPTA) 100 MCG/ACT Inhalation AEROSOL POWDER, BREATH ACTIVATED Inhale 1 puff by mouth daily. 1 each 5   . Fluticasone Propionate 50 MCG/ACT Nasal Suspension Spray 2 sprays into each nostril daily. 1 Inhaler 12   . Ketoconazole 2 % External Shampoo lather daily 120 mL 12   . Loratadine 10 MG Oral Tab Take 1 tablet (10 mg) by mouth daily for allergies. 30 tablet 3   . Triamcinolone Acetonide 0.1 % External Ointment Apply to affected area on neck 2 times a day. 15 g 3     No current facility-administered medications for this visit.      ALLERGIES:  Review of patient's allergies indicates:  No Known Allergies  RISK FACTORS:  Substance abuse: No  Tobacco history: Nathan [name] reports that he quit smoking about 44 years ago. He started smoking about 54 years ago. He has never used smokeless tobacco.  Comments: None    FAMILY MEDICAL HISTORY:  Nathan [name]'s family history is negative for Blindness, Cataracts, Glaucoma, Macular Degeneration, and Retinal Detachment.    SOCIAL HISTORY:  Living: Alone  Education: 12th grade (diploma)   Marital status: single    REVIEW OF SYSTEMS:  The remaining review of systems was reviewed and is negative.    PHYSICAL EXAM:  BP 138/74   Pulse 66   Temp 96.8 F (36 C)   Ht 5\' 2"  (1.575 m)   Wt (!) 240 lb (108.9 kg)   BMI 43.90 kg/m   General: WDWN middle aged man  in NAD  Head: NCAT  Eyes: PERRLA, anicteric sclera  ENT: MMM, normal dentition  Neck: Supple, normal ROM  CV: Bradycardic, no m/r/g  Respiratory: CTA b/l, no w/r/r   Abdomen: Soft, NTND, obese, no organomegaly  Extremities: No LE edema, pulses 2+ and equal b/l  MSK: Equal bulk and tone b/l  Skin: No obvious rashes    LAB RESULTS:  Creatinine stable between 1.2-1.3 for multiple years    ASSESSMENT AND PLAN:  65 y/o man with h/o asthma, mild CKD presenting to establish care.    #Asthma with h/o status epilepticus  - Well controlled on ellipta, no recent use of albuterol PRN inhaler  - Continue to monitor, PRN albuterol    #Elevated creatinine   - likely pt's baseline given age and overall muscle mass.  Stable for many years without proteinuria, will recheck today    #Obesity  - discussed weight loss strategies at length, pt will attempt to decrease eating carrot cakes and other sweets.  Is also attempting to get tires for his bike to increase his exercise.    #Gout  - continue allopurinol for ppyx    Health Maintenance  - FIT cards provided today  Pt discussed with Dr. Clista Bernhardt, MD  Internal Medicine PGY-3

## 2016-02-28 LAB — OCCULT BLOOD BY IA, STL: Occult Bld 1 Result: NEGATIVE

## 2016-03-05 ENCOUNTER — Other Ambulatory Visit: Payer: Self-pay | Admitting: Internal Medicine

## 2016-03-05 DIAGNOSIS — M109 Gout, unspecified: Secondary | ICD-10-CM

## 2016-03-05 NOTE — Telephone Encounter (Signed)
The patient last received this medication at the requesting pharmacy on 01/22/2016 #30

## 2016-03-06 MED ORDER — ALLOPURINOL 100 MG OR TABS
ORAL_TABLET | ORAL | 5 refills | Status: DC
Start: 2016-03-06 — End: 2016-09-16

## 2016-03-07 NOTE — Progress Notes (Signed)
I have personally discussed the case with Dr. MARCUS, PATRICK NEAL during or immediately after the patient visit including review of history, physical exam, diagnosis, and treatment plan. I agree with the assessment and plan of care.

## 2016-04-21 ENCOUNTER — Other Ambulatory Visit (HOSPITAL_BASED_OUTPATIENT_CLINIC_OR_DEPARTMENT_OTHER): Payer: Self-pay | Admitting: Internal Medicine

## 2016-04-21 DIAGNOSIS — M109 Gout, unspecified: Secondary | ICD-10-CM

## 2016-04-22 NOTE — Telephone Encounter (Signed)
Refill request for Colcrys requires your authorization because it was discontinued off medication list on 05/01/15 but patient is now requesting a refill.    Reason for discontinuation: changed to allopurinol    Colcrys last filled 07/31/15    If medication is DISCONTINUED please "refuse all" medication requests and have your staff contact the patient.  If medication is to be continued please add refills, sign order and close encounter.

## 2016-05-04 ENCOUNTER — Other Ambulatory Visit (HOSPITAL_BASED_OUTPATIENT_CLINIC_OR_DEPARTMENT_OTHER): Payer: Self-pay | Admitting: Internal Medicine

## 2016-05-04 DIAGNOSIS — Z0000 Encounter for general adult medical examination without abnormal findings: Secondary | ICD-10-CM

## 2016-05-05 MED ORDER — ASPIRIN EC LOW DOSE 81 MG OR TBEC
81.0000 mg | DELAYED_RELEASE_TABLET | Freq: Every day | ORAL | 11 refills | Status: DC
Start: 2016-05-05 — End: 2016-09-18

## 2016-05-09 ENCOUNTER — Other Ambulatory Visit (HOSPITAL_BASED_OUTPATIENT_CLINIC_OR_DEPARTMENT_OTHER): Payer: Self-pay | Admitting: Internal Medicine

## 2016-05-09 DIAGNOSIS — M109 Gout, unspecified: Secondary | ICD-10-CM

## 2016-05-09 NOTE — Telephone Encounter (Signed)
Refused. See 04-21-16 also.

## 2016-05-14 ENCOUNTER — Encounter (HOSPITAL_BASED_OUTPATIENT_CLINIC_OR_DEPARTMENT_OTHER): Payer: Self-pay | Admitting: Internal Medicine

## 2016-05-14 ENCOUNTER — Ambulatory Visit (HOSPITAL_BASED_OUTPATIENT_CLINIC_OR_DEPARTMENT_OTHER): Payer: 59 | Attending: Internal Medicine | Admitting: Registered Nurse

## 2016-05-14 ENCOUNTER — Ambulatory Visit (HOSPITAL_BASED_OUTPATIENT_CLINIC_OR_DEPARTMENT_OTHER): Payer: 59 | Admitting: Internal Medicine

## 2016-05-14 DIAGNOSIS — L84 Corns and callosities: Secondary | ICD-10-CM

## 2016-05-14 NOTE — Progress Notes (Signed)
Pt presents to triage with a small pea-sized mass under the skin on the outside of his right thumb.  Appeared between 2-4 weeks ago. Denies any pain. Has full ROM. Reports that he has been fishing often the last 2 weeks using right hand, but does not think it is a callous.    Would appreciate a provider taking a look. RN scheduled visit with Dr Erlene Quan @ 1530 today.

## 2016-05-14 NOTE — Progress Notes (Signed)
Patient declined vitals.

## 2016-05-14 NOTE — Progress Notes (Signed)
[facility] ADULT MEDICINE CLINIC  OUTPATIENT ONE TIME VISIT NOTE    DATE:  05/14/2016    ID/CHIEF COMPLAINT: Nathan [name] is a 65 year old male patient of Dr. Beverely Low here for one time visit for bump on thumb    ASSESSMENT & PLAN  #Callus of hand  Not verrucous in appearance. No signs inflammation. Unclear why he presented for this simple complaint, but did not have other issues to discuss.  - Gloves vs tape to avoid friction to hand  - RTC if any new symptoms    #HCM  - declined flu shot    RTC: PRN  Discussed with: Dr Caryl Comes    SUBJECTIVE:   Interval History/HPI  Noticed a small bump on inner surface of thumb, 1-2 weeks ago. No pain, non-tender, no redness/skin changes. No changes since onset.    Thinks this may be related to his new fishing rod. 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 Is R handed, does not wear gloves.    ROS: Other than stated above, a complete review of 11/14 systems was conducted and negative      OBJECTIVE:   There were no vitals taken for this visit.    Wt Readings from Last 3 Encounters:   02/22/16 (!) 240 lb (108.9 kg)   05/01/15 (!) 237 lb (107.5 kg)   02/15/15 (!) 220 lb (99.8 kg)     BP Readings from Last 3 Encounters:   02/22/16 138/74   11/07/15 134/82   07/05/15 133/88       Exam:  Physical Exam    GEN: NAD  SKIN: No rashes to overlying skin of hand/arms  MSK/EXT: Smooth, non-tender raised skin to medial surface of skin overlying DIP joint. Non-tender. No synovitis/crepitus to hand joint  PSYCH:   Appropriate and euthymic   NEURO: Alert and fully oriente    Lab Results/Radiology:  None reviewed    SUPPLEMENTAL INFORMATION  Problem List  Patient Active Problem List   Diagnosis   . Obesity, unspecified   . Unspecified asthma, with status asthmaticus   . Generalized osteoarthrosis, unspecified site   . Other and unspecified hyperlipidemia   . Presbyopia   . Gout   . Irregular heart beat   .  Achilles tendinosis of right lower extremity     Medications  Current Outpatient Prescriptions   Medication Sig Dispense Refill   . Albuterol Sulfate HFA 108 (90 BASE) MCG/ACT Inhalation Aero Soln Inhale 2 puffs by mouth every 6 hours as needed for shortness of breath/wheezing. For shortness of breath. 1 Inhaler 12   . Allopurinol 100 MG Oral Tab Take 1 tablet (100 mg) by mouth daily. 30 tablet 5   . ASPIRIN EC LOW DOSE 81 MG Oral Tab EC Take 1 tablet (81 mg) by mouth daily. To prevent heart attack/stroke. 30 tablet 11   . Atorvastatin Calcium 40 MG Oral Tab Take 1 tablet (40 mg) by mouth every evening. To lower cholesterol. 30 tablet 11   . Fluticasone Furoate (ARNUITY ELLIPTA) 100 MCG/ACT Inhalation AEROSOL POWDER, BREATH ACTIVATED Inhale 1 puff by mouth daily. 1 each 5   . Fluticasone Propionate 50 MCG/ACT Nasal Suspension Spray 2 sprays into each nostril daily. 1 Inhaler 12   . Ketoconazole 2 % External Shampoo lather daily 120 mL 12   . Loratadine 10 MG Oral Tab Take 1 tablet (10 mg) by mouth daily for  allergies. 30 tablet 3   . Triamcinolone Acetonide 0.1 % External Ointment Apply to affected area on neck 2 times a day. 15 g 3     No current facility-administered medications for this visit.        Allergies  Review of patient's allergies indicates:  No Known Allergies

## 2016-05-14 NOTE — Patient Instructions (Addendum)
It was a pleasure seeing you today    -Refills: Call the pharmacy at least 4 working days before you run out. Do not call the clinic for refills; its quicker and safer to go through pharmacy    -Test Results: Available in 1-2 weeks. I will contact you by eCare or letter, unless there is something urgent, in which case I will call you sooner    -Urgent symptoms:Call [phone] and press 8 for the nursing line, day or night. Our clinic staff will help during the day; after hours our on call nurses will help.    The bump on your thumb looks like a callus, or buildup of skin where you put extra pressure (probably from your fishing rod). I recommend wearing gloves while you fish. If you have new joint swelling/pain or any new or concerning symptoms, please come back to clinic.

## 2016-05-20 ENCOUNTER — Other Ambulatory Visit (HOSPITAL_BASED_OUTPATIENT_CLINIC_OR_DEPARTMENT_OTHER): Payer: Self-pay | Admitting: Internal Medicine

## 2016-05-20 DIAGNOSIS — M109 Gout, unspecified: Secondary | ICD-10-CM

## 2016-05-29 NOTE — Progress Notes (Signed)
Attending Statement     I have personally discussed the case with the resident during or immediately after the patient visit including review of history, physical exam, diagnosis, and treatment plan. I agree with the assessment and plan of care.     Fawaz Borquez W. Clotilda Hafer, MD, MPH   Attending Physician

## 2016-07-02 ENCOUNTER — Other Ambulatory Visit (HOSPITAL_BASED_OUTPATIENT_CLINIC_OR_DEPARTMENT_OTHER): Payer: Self-pay | Admitting: Internal Medicine

## 2016-07-02 DIAGNOSIS — R21 Rash and other nonspecific skin eruption: Secondary | ICD-10-CM

## 2016-07-02 MED ORDER — TRIAMCINOLONE ACETONIDE 0.1 % EX OINT
TOPICAL_OINTMENT | Freq: Two times a day (BID) | CUTANEOUS | 0 refills | Status: DC
Start: 2016-07-02 — End: 2023-03-20

## 2016-08-07 ENCOUNTER — Ambulatory Visit (HOSPITAL_BASED_OUTPATIENT_CLINIC_OR_DEPARTMENT_OTHER): Payer: 59

## 2016-08-07 NOTE — Progress Notes (Signed)
Issue: Patient appears in [facility] Triage d/t possible HTN    Pt reports that his Dental appt for extractions was cancelled today d/t elevated BP  161/102.  In clinic today,  BP 141/66   Pulse 82   Resp 18   Ht 5\' 2"  (1.575 m)   Wt (!) 248 lb (112.5 kg)   BMI 45.36 kg/m   And 137/72  HR 79 which are all consistent with pt's past BP history in this clinic. (see below)    Vitals 02/15/2015 05/01/2015 06/27/2015 07/05/2015 [id number]   Systolic [id number] Q000111Q Q000111Q   Diastolic 79 80 90 88 82   BP Position Sitting Sitting Sitting Sitting Sitting   BP Cuff Size Large Large Large Regular Regular   BP Site Left Arm Right Arm Left Arm Right Arm Right Arm     Vitals [id number]   Systolic 0000000   Diastolic 74   BP Position Sitting   BP Cuff Size Large   BP Site Left Arm     Outcome: This RN discussed BP situation with Attending MD, Dr. Willette Pa agrees that BP are within acceptable range for this patient.  He signed consent for extraction, which was faxed to the Dental Clinic.

## 2016-08-22 ENCOUNTER — Encounter (HOSPITAL_BASED_OUTPATIENT_CLINIC_OR_DEPARTMENT_OTHER): Payer: 59 | Admitting: Internal Medicine

## 2016-09-16 ENCOUNTER — Other Ambulatory Visit: Payer: Self-pay | Admitting: Internal Medicine

## 2016-09-16 DIAGNOSIS — M109 Gout, unspecified: Secondary | ICD-10-CM

## 2016-09-17 MED ORDER — ALLOPURINOL 100 MG OR TABS
ORAL_TABLET | ORAL | 0 refills | Status: DC
Start: 2016-09-17 — End: 2016-09-18

## 2016-09-18 ENCOUNTER — Encounter (HOSPITAL_BASED_OUTPATIENT_CLINIC_OR_DEPARTMENT_OTHER): Payer: Self-pay | Admitting: Internal Medicine

## 2016-09-18 ENCOUNTER — Ambulatory Visit (HOSPITAL_BASED_OUTPATIENT_CLINIC_OR_DEPARTMENT_OTHER): Payer: 59 | Attending: Internal Medicine | Admitting: Internal Medicine

## 2016-09-18 VITALS — BP 137/80 | HR 92 | Temp 97.3°F | Resp 16 | Ht 62.0 in | Wt 251.4 lb

## 2016-09-18 DIAGNOSIS — R7303 Prediabetes: Secondary | ICD-10-CM | POA: Insufficient documentation

## 2016-09-18 DIAGNOSIS — J45902 Unspecified asthma with status asthmaticus: Secondary | ICD-10-CM | POA: Insufficient documentation

## 2016-09-18 DIAGNOSIS — M109 Gout, unspecified: Secondary | ICD-10-CM | POA: Insufficient documentation

## 2016-09-18 DIAGNOSIS — M25512 Pain in left shoulder: Secondary | ICD-10-CM | POA: Insufficient documentation

## 2016-09-18 DIAGNOSIS — Z0000 Encounter for general adult medical examination without abnormal findings: Secondary | ICD-10-CM | POA: Insufficient documentation

## 2016-09-18 DIAGNOSIS — J3089 Other allergic rhinitis: Secondary | ICD-10-CM | POA: Insufficient documentation

## 2016-09-18 DIAGNOSIS — E785 Hyperlipidemia, unspecified: Secondary | ICD-10-CM | POA: Insufficient documentation

## 2016-09-18 MED ORDER — FLUTICASONE FUROATE 100 MCG/ACT IN AEPB
1.0000 | INHALATION_SPRAY | Freq: Every day | RESPIRATORY_TRACT | 11 refills | Status: DC
Start: 2016-09-18 — End: 2017-02-19

## 2016-09-18 MED ORDER — LORATADINE 10 MG OR TABS
ORAL_TABLET | ORAL | 11 refills | Status: DC
Start: 2016-09-18 — End: 2017-04-27

## 2016-09-18 MED ORDER — ASPIRIN 81 MG OR TBEC
81.0000 mg | DELAYED_RELEASE_TABLET | Freq: Every day | ORAL | 11 refills | Status: DC
Start: 2016-09-18 — End: 2017-10-23

## 2016-09-18 MED ORDER — ALLOPURINOL 100 MG OR TABS
100.0000 mg | ORAL_TABLET | Freq: Every day | ORAL | 11 refills | Status: DC
Start: 2016-09-18 — End: 2017-10-23

## 2016-09-18 MED ORDER — ATORVASTATIN CALCIUM 40 MG OR TABS
ORAL_TABLET | ORAL | 11 refills | Status: DC
Start: 2016-09-18 — End: 2017-10-23

## 2016-09-19 ENCOUNTER — Other Ambulatory Visit (HOSPITAL_BASED_OUTPATIENT_CLINIC_OR_DEPARTMENT_OTHER): Payer: Self-pay | Admitting: Internal Medicine

## 2016-09-19 DIAGNOSIS — B350 Tinea barbae and tinea capitis: Secondary | ICD-10-CM

## 2016-09-19 NOTE — Progress Notes (Signed)
I have personally discussed the case with the resident during or immediately after the patient visit including review of history, physical exam, diagnosis, and treatment plan. I agree with the assessment and plan of care.

## 2016-09-19 NOTE — Progress Notes (Signed)
Adult Medicine Clinic - Outpatient Initial Clinic Note    DATE: 09/18/2016     Nathan [name] is a 66 year old male who is here today to establish care. An interpreter was not needed for the visit.      CHIEF COMPLAINT: Asthma and Chronic Kidney Disease    INTERVAL HISTORY/HPI:  Mr. Heap has been doing well since her last visit.  His asthma has been well controlled on his daily inhaled corticosteroid, has not needed his rescue inhaler since her last visit.    He does note that he has been suffering from left shoulder pain since about New Year's Day.  Pain predominantly over left anterior shoulder with some radiation to left lateral shoulder.  Pain worse with abduction, pushups, not relieved with any specific motions or treatments.  Has had similar symptoms in the past which resolved after 3 and 6 weeks respectively.  No inciting trauma, the patient does note history of prior football injury and symptoms similar to this after that injury.  Denies clicking, no popping, strength intact, range of motion painful in abduction, no subluxation or dislocation recently or in the past.  No numbness, tingling, or issues with strength distal to injury.  No neck pain.    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  Also does not like the legalization of marijuana, as the smoke is "disgusting" and he doesn't enjoy how "it makes everyone crazy".      PROBLEM LIST:  Patient Active Problem List    Diagnosis Date Noted   . Gout 05/27/2011   . Obesity, unspecified 11/24/2008   . Unspecified asthma, with status asthmaticus 11/24/2008   . Generalized osteoarthrosis, unspecified site 11/24/2008   . Other and unspecified hyperlipidemia 11/24/2008   . Presbyopia 11/24/2008   . 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 Generalized osteoarthrosis, unspecified site 11/24/2008   . Obesity, unspecified 11/24/2008   . Other and unspecified hyperlipidemia 11/24/2008   . Unspecified asthma, with status asthmaticus 11/24/2008   . Urinary complications [id number]     MEDICATIONS:  Current Outpatient Prescriptions   Medication Sig Dispense Refill   . Albuterol Sulfate HFA 108 (90 BASE) MCG/ACT Inhalation Aero Soln Inhale 2 puffs by mouth every 6 hours as needed for shortness of breath/wheezing. For shortness of breath. 1 Inhaler 12   . Allopurinol 100 MG Oral Tab Take 1 tablet (100 mg) by mouth daily. 30 tablet 11   . Aspirin (ASPIRIN EC LOW DOSE) 81 MG Oral Tab EC Take 1 tablet (81 mg) by mouth daily. To prevent heart attack/stroke. 30 tablet 11   . Atorvastatin Calcium 40 MG Oral Tab Take 1 tablet (40 mg) by mouth every evening. To lower cholesterol. 30 tablet 11   . Fluticasone Furoate (ARNUITY ELLIPTA) 100 MCG/ACT Inhalation AEROSOL POWDER, BREATH ACTIVATED Inhale 1 puff by mouth daily. 1 each 11   . Fluticasone Propionate 50 MCG/ACT Nasal Suspension Spray 2 sprays into each nostril daily. 1 Inhaler 12   . Ketoconazole 2 % External Shampoo lather daily 120 mL 12   . Loratadine 10 MG Oral Tab  Take 1 tablet (10 mg) by mouth daily for allergies. 30 tablet 11   . Triamcinolone Acetonide 0.1 % External Ointment Apply to affected area on neck 2 times a day. 15 g 0     No current facility-administered medications for this visit.      ALLERGIES:  Review of patient's allergies indicates:  No Known Allergies  RISK FACTORS:  Substance abuse: No  Tobacco history: Redford reports that he quit smoking about 45 years ago. He started smoking about 55 years ago. He has never used smokeless tobacco.  Comments: None    FAMILY MEDICAL HISTORY:  Yunior's family history is negative for Blindness, Cataracts, Glaucoma, Macular Degeneration, and Retinal  Detachment.    SOCIAL HISTORY:  Living: Alone  Education: 12th grade (diploma)   Marital status: single    REVIEW OF SYSTEMS:  The remaining review of systems was reviewed and is negative.    PHYSICAL EXAM:  BP 137/80   Pulse 92   Temp 97.3 F (36.3 C) (Temporal)   Resp 16   Ht 5\' 2"  (1.575 m)   Wt (!) 251 lb 6.4 oz (114 kg)   BMI 45.98 kg/m   General: WDWN middle aged man in NAD  Head: NCAT  Eyes: PERRLA, anicteric sclera  ENT: MMM, normal dentition  Neck: Normal ROM, negative Spurling's  CV: RRR, no m/r/g  Respiratory: CTA b/l, no w/r/r   Abdomen: Soft, NTND, obese, no organomegaly  Extremities: No LE edema, pulses 2+ and equal b/l  MSK:  Left shoulder:  Active range of motion 110 abduction limited by pain, 180 flexion, 90 external rotation, T7 internal rotation.  No tenderness on palpation over clavicle, AC joint, lateral shoulder, biceps tendon.  Negative Hawkins, Neer's, Yergason's, speed's, cross arm, no sulcus sign, negative apprehension test.  Normal strength  Right shoulder: Normal range of motion, negative provocative exam, normal strength.  Skin: No obvious rashes    LAB RESULTS:  Creatinine stable between 1.2-1.3 for multiple years    ASSESSMENT AND PLAN:  66 y/o man with h/o asthma, mild CKD presenting for f/u.    #Asthma with h/o status asthmaticus  - Well controlled on ellipta, no recent use of albuterol PRN inhaler  - Continue to monitor, PRN albuterol not used since our last visit    #L shoulder pain  Exam most consistent with GH arthritis, potential rotator cuff strain.    - Pain ctrl with tylenol  - XR today  - Consider PT depending on progression/improvement, pt deferred at this time.    #Elevated creatinine   - likely pt's baseline given age and overall muscle mass.  Stable for many years without proteinuria    #Obesity  - discussed weight loss strategies at length, pt will attempt to decrease eating carrot cakes and other sweets.  Is also attempting to get tires for his bike to  increase his exercise.    #Gout  - continue allopurinol for ppyx    Health Maintenance  - FIT cards provided at last visit    Pt discussed with Dr. Howard Pouch, MD  Internal Medicine PGY-3

## 2016-09-22 MED ORDER — KETOCONAZOLE 2 % EX SHAM
MEDICATED_SHAMPOO | CUTANEOUS | 1 refills | Status: DC
Start: 2016-09-22 — End: 2018-03-03

## 2016-12-22 ENCOUNTER — Telehealth (HOSPITAL_BASED_OUTPATIENT_CLINIC_OR_DEPARTMENT_OTHER): Payer: Self-pay | Admitting: Internal Medicine

## 2016-12-22 NOTE — Telephone Encounter (Signed)
I call the patient nobody answer then a sec he call back per pt he know that doctor is leaving in clinic, I remind him that he will be in recall list for his new doctor and send to mail later.

## 2017-02-19 ENCOUNTER — Other Ambulatory Visit (HOSPITAL_BASED_OUTPATIENT_CLINIC_OR_DEPARTMENT_OTHER): Payer: Self-pay | Admitting: Internal Medicine

## 2017-02-19 DIAGNOSIS — J45902 Unspecified asthma with status asthmaticus: Secondary | ICD-10-CM

## 2017-02-19 MED ORDER — FLUTICASONE PROPIONATE HFA 110 MCG/ACT IN AERO
1.0000 | INHALATION_SPRAY | Freq: Two times a day (BID) | RESPIRATORY_TRACT | 2 refills | Status: DC
Start: 2017-02-19 — End: 2017-10-30

## 2017-02-19 NOTE — Progress Notes (Signed)
[facility] pharmacist called to inform us of Arnuity, now non-formulary. Will change to Fluticasone HFA 165mcg q12h

## 2017-04-20 ENCOUNTER — Other Ambulatory Visit (HOSPITAL_BASED_OUTPATIENT_CLINIC_OR_DEPARTMENT_OTHER): Payer: Self-pay | Admitting: Internal Medicine

## 2017-04-20 DIAGNOSIS — J45902 Unspecified asthma with status asthmaticus: Secondary | ICD-10-CM

## 2017-04-20 NOTE — Telephone Encounter (Signed)
Need new provider on Rx

## 2017-04-24 MED ORDER — FLUTICASONE PROPIONATE 50 MCG/ACT NA SUSP
2.0000 | Freq: Every day | NASAL | 0 refills | Status: DC
Start: 2017-04-24 — End: 2017-10-30

## 2017-04-27 ENCOUNTER — Encounter (HOSPITAL_BASED_OUTPATIENT_CLINIC_OR_DEPARTMENT_OTHER): Payer: Self-pay

## 2017-04-27 ENCOUNTER — Ambulatory Visit (HOSPITAL_BASED_OUTPATIENT_CLINIC_OR_DEPARTMENT_OTHER): Payer: 59 | Attending: Internal Medicine

## 2017-04-27 VITALS — BP 123/77 | HR 56 | Temp 96.1°F | Ht 62.0 in | Wt 251.0 lb

## 2017-04-27 DIAGNOSIS — Z6841 Body Mass Index (BMI) 40.0 and over, adult: Secondary | ICD-10-CM | POA: Insufficient documentation

## 2017-04-27 DIAGNOSIS — Z0000 Encounter for general adult medical examination without abnormal findings: Secondary | ICD-10-CM | POA: Insufficient documentation
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
## 2017-04-27 NOTE — Patient Instructions (Addendum)
Bring your medications at the next time   We will check in about your eyes

## 2017-05-04 ENCOUNTER — Other Ambulatory Visit (HOSPITAL_BASED_OUTPATIENT_CLINIC_OR_DEPARTMENT_OTHER): Admit: 2017-05-04 | Discharge: 2017-05-04 | Disposition: A | Discharge status: Home / Self Care | Payer: 59

## 2017-05-04 ENCOUNTER — Other Ambulatory Visit (HOSPITAL_BASED_OUTPATIENT_CLINIC_OR_DEPARTMENT_OTHER): Payer: Self-pay

## 2017-05-04 DIAGNOSIS — Z1211 Encounter for screening for malignant neoplasm of colon: Secondary | ICD-10-CM | POA: Insufficient documentation

## 2017-05-04 DIAGNOSIS — Z0000 Encounter for general adult medical examination without abnormal findings: Secondary | ICD-10-CM | POA: Insufficient documentation

## 2017-05-05 LAB — OCCULT BLOOD BY IA, STL: Occult Bld 1 Result: NEGATIVE

## 2017-05-08 ENCOUNTER — Encounter (HOSPITAL_BASED_OUTPATIENT_CLINIC_OR_DEPARTMENT_OTHER): Payer: Self-pay

## 2017-05-18 NOTE — Progress Notes (Signed)
ATTENDING NOTE  I have personally discussed the case with Alice Y Mao, MD during or immediately after the patient visit including review of history, physical exam, diagnosis, and treatment plan. I agree with the assessment and plan of care.

## 2017-06-04 ENCOUNTER — Telehealth (HOSPITAL_BASED_OUTPATIENT_CLINIC_OR_DEPARTMENT_OTHER): Payer: Self-pay | Admitting: Internal Medicine

## 2017-06-04 NOTE — Telephone Encounter (Signed)
Pt called with questions about insurance coverage after the first of the year.   Provided # to [facility] and call transferred.

## 2017-07-24 ENCOUNTER — Encounter (HOSPITAL_BASED_OUTPATIENT_CLINIC_OR_DEPARTMENT_OTHER): Payer: Self-pay | Admitting: Internal Medicine

## 2017-07-24 ENCOUNTER — Ambulatory Visit (HOSPITAL_BASED_OUTPATIENT_CLINIC_OR_DEPARTMENT_OTHER): Payer: Medicare Other | Attending: Internal Medicine | Admitting: Internal Medicine

## 2017-07-24 VITALS — BP 167/74 | HR 60 | Temp 97.5°F | Resp 16 | Wt 257.2 lb

## 2017-07-24 DIAGNOSIS — M1A3790 Chronic gout due to renal impairment, unspecified ankle and foot, without tophus (tophi): Secondary | ICD-10-CM | POA: Insufficient documentation

## 2017-07-24 DIAGNOSIS — Z6841 Body Mass Index (BMI) 40.0 and over, adult: Secondary | ICD-10-CM

## 2017-07-24 MED ORDER — COLCHICINE 0.6 MG OR TABS
ORAL_TABLET | ORAL | 0 refills | Status: DC
Start: 2017-07-24 — End: 2018-07-07

## 2017-07-24 NOTE — Patient Instructions (Addendum)
We discussed your gout. You have not had any flare ups. Continue taking your allopurinol for prophylaxis and you can take your colchicine if you have a flare.

## 2017-07-24 NOTE — Progress Notes (Signed)
Adult Medicine Clinic- Acute Care Visit    Date: 11/19/7780    PCP: Laurette Schimke, MD    ID/CC:   Nathan [name] is a 67 year old male with history of asthma, mild CKD who is here today for gout medication refill.     HPI/Interval History:  Nathan [name] has been doing well, and last saw Dr. Ronny Bacon in October to establish care. Today he wishes for his colchicine and oxycodone to be refilled. He has been taking allopurinol for many years, and has had less frequent gout outbreaks as a result. His last gout flare was about 9 months ago, for which he takes oxycodone and colchicine. His flares seem to occur in his toes or around his Achilles, and can occur on either foot. He does not drink alcohol and has been cutting down on red meat.    He has been working to air out his apartment and clean up dust, as he believes this is contributing to his itchy eyes. Does not want eye drops at this time.     He has no other concerns and is otherwise in his normal state of health. He defers the zoster vaccine.     Medications:  Current Outpatient Prescriptions   Medication Sig Dispense Refill    Allopurinol 100 MG Oral Tab Take 1 tablet (100 mg) by mouth daily. 30 tablet 11    Aspirin (ASPIRIN EC LOW DOSE) 81 MG Oral Tab EC Take 1 tablet (81 mg) by mouth daily. To prevent heart attack/stroke. 30 tablet 11    Atorvastatin Calcium 40 MG Oral Tab Take 1 tablet (40 mg) by mouth every evening. To lower cholesterol. 30 tablet 11    Fluticasone Propionate 50 MCG/ACT Nasal Suspension Spray 2 sprays into each nostril daily. 1 Inhaler 0    Fluticasone Propionate HFA 110 MCG/ACT Inhalation Aerosol Inhale 1 puff by mouth every 12 hours. 1 Inhaler 2    Ketoconazole 2 % External Shampoo Apply on scalp, lather and rinse off daily 120 mL 1    Triamcinolone Acetonide 0.1 % External Ointment Apply to affected area on neck 2 times a day. 15 g 0     No current facility-administered medications for this visit.        Physical Exam:   BP 167/74    Pulse  60    Temp 97.5 F (36.4 C) (Temporal)    Resp 16    Wt (!) 257 lb 3.2 oz (116.7 kg)    BMI 47.04 kg/m      Gen: Overweight gentleman, NAD, pleasantly interactive  CV: RRR, no mrg, trace peripheral edema  Lungs: CTAB, normal respiratory effort  Abd: Nondistended, nontender to palpation  Neuro: No focal deficits, moving all extremities equally    Relevant Labs/Studies:   Results for orders placed or performed in visit on 05/04/17   OCCULT BLOOD BY IA, STL   Result Value Ref Range    Occult Bld 1 Result Negative NRN     Assessment/Plan:  Nathan [name] is a 67 year old male with history of asthma, mild CKD who is here today for gout medication refill. 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 Colchicine has been helpful for him when he has gout flares. Uric acid < 7. Deferred prescription for oxycodone today as he still has a  few pills left and recommended that he can call when he has a gout flare. Encouraged him to avoid alcohol and red meat. Warned against using NSAIDs for pain.  -     Colchicine 0.6 MG Oral Tab; Take 2 tablets by mouth at first sign of flare followed by 1 table 1 hour later    #CKD  CKD stable w/ Cr 1.2-1.3. After pt left, noted that his BP was high.  - f/u BP check at next visit     Follow-up: 3 months w/ Silas Flood, MD

## 2017-07-26 NOTE — Progress Notes (Signed)
I have personally discussed the case with the resident during or immediately after the patient visit including review of history, physical exam, diagnosis, and treatment plan. I agree with the assessment and plan of care.

## 2017-09-22 ENCOUNTER — Telehealth (HOSPITAL_BASED_OUTPATIENT_CLINIC_OR_DEPARTMENT_OTHER): Payer: Self-pay

## 2017-09-22 NOTE — Telephone Encounter (Signed)
Checking to see if vitamin C is okay to take.  Went over medication list, no red flags.  Vitamin should be safe to take.

## 2017-10-23 ENCOUNTER — Telehealth (HOSPITAL_BASED_OUTPATIENT_CLINIC_OR_DEPARTMENT_OTHER): Payer: Self-pay

## 2017-10-23 DIAGNOSIS — Z0000 Encounter for general adult medical examination without abnormal findings: Secondary | ICD-10-CM

## 2017-10-23 DIAGNOSIS — R7303 Prediabetes: Secondary | ICD-10-CM

## 2017-10-23 DIAGNOSIS — E785 Hyperlipidemia, unspecified: Secondary | ICD-10-CM

## 2017-10-23 DIAGNOSIS — M109 Gout, unspecified: Secondary | ICD-10-CM

## 2017-10-23 MED ORDER — ASPIRIN 81 MG OR TBEC
81.0000 mg | DELAYED_RELEASE_TABLET | Freq: Every day | ORAL | 2 refills | Status: DC
Start: 2017-10-23 — End: 2018-01-22

## 2017-10-23 MED ORDER — ALLOPURINOL 100 MG OR TABS
100.0000 mg | ORAL_TABLET | Freq: Every day | ORAL | 2 refills | Status: DC
Start: 2017-10-23 — End: 2017-10-30

## 2017-10-23 MED ORDER — ATORVASTATIN CALCIUM 40 MG OR TABS
ORAL_TABLET | ORAL | 2 refills | Status: DC
Start: 2017-10-23 — End: 2017-10-30

## 2017-10-23 NOTE — Telephone Encounter (Signed)
(  TEXTING IS AN OPTION FOR [facility] ONLY)  Is this a [place] clinic? No      RETURN CALL: Detailed message on voicemail only      SUBJECT:  Refill Request    MEDICATION(S): Aspirin (ASPIRIN EC LOW DOSE) 81 MG Oral Tab EC, Allopurinol 100 MG Oral Tab and Atorvastatin Calcium 40 MG Oral Tab  NEEDED BY: patient does not have enough Aspirin or Allopurinol for two business days  PRESCRIBING PROVIDER: [address] [id number] [address] [place] [phone]  ADDITIONAL INFORMATION: see above

## 2017-10-29 DIAGNOSIS — J4520 Mild intermittent asthma, uncomplicated: Secondary | ICD-10-CM | POA: Insufficient documentation

## 2017-10-29 NOTE — Progress Notes (Signed)
[facility]       CC: [name] is a 67 year old English speaking male history of asthma, mild CKD, gout here for follow up     CHIEF COMPLAINT: Medication Check and Refill Request      HPI:   Last seen by me in clinic 04/27/2017 for establishment of care. At that time we discussed his asthma which was well controlled on fluticasone and albuterol prn. We discussed his obesity and exercise for which he is awaiting his medicare card.     In the interim saw Dr. Renea Ee 07/24/2017 for refill of his colcichine. He also takes oxycodone prn for gout.     Subjective:    Changed insurance from [employer] to [employer] and B. Try to save money to move housing so agreeable that he doesn't want to be on too many meds that he doesn't need. Also likes to fish: it'll be salmon season in June.     Med refill:   -Uses triamcinolone for "bumps" on neck   -Uses ketoconazole shampoo with scrubber to scrub around his facial hair and lightens up face   -Fluticasone nasal spray   -Takes loratidine 10mg  for allergy (has them at home)   -Aspirin: he was put on this for palpitations (?) has bigeminy on one of his EKG's. No history of Afib. Has not had MI or stroke or DM.     Asthma: Diagnosed approx 30 years ago. Had PFTs with what sounded like positive bronchodilator reponse in the 1980s or 1990s at [facility].. Started smoking 77-19 yo and thinks that's what triggered it and was told by doc to stop. Has been wheezy lately. Fluticasone inhaler helps with wheeze but didn't use for 2 months as he had run out between insurance change. Hasn't used his albuterol much.     Gout: Has not had flares in 2 years. Taking allopurinol and colchicine. Used to take oxycodone for pain. Was told not to use NSAIDs.     Skin tags: Has a few small skin tags on neck and trunk, wondering if he can freeze them off. Sometimes he gets rid of them himself with thread     Physical Examination:   BP 139/89    Pulse 72    Temp 97 F (36.1  C) (Temporal)    Ht 5' 2.01" (1.575 m)    Wt (!) 253 lb (114.8 kg)    SpO2 97%    BMI 46.26 kg/m   GENERAL: Comfortable appearing overweight gentleman sitting in chair in no acute distress  NECK: Soft, NT, no LAD  CHEST: Comfortable WOB, moderate air movement and Lungs clear to auscultation B/L without wheezing  CV: RRR, nl S1/S2, no m/r/g. No LE edema  ABD: BT+. Soft, NT, ND  NEURO: Appropriate gait. Using all extremities appropriately and purposefully  MSK: Normal bulk and tone in all extremities    Assessment/Plan:   [name] is a 67 year old English speaking male history of asthma, mild CKD, gout here for follow up     #Asthma with h/o status asthmaticus  Diagnosed when he was in his teens and smoking. Has had PFTs in 1980s or 1990s with what sounds like positive bronchodilator effect. Quit smoking. May have component of COPD but difficult to tell at this point. Well controlled on fluticasone, unclear if he uses his albuterol very much or understands how to use it. No need for PFTs now. Refilled fluticasone inhaler and albuterol  prn. Discussed how to use both.   -Fluticasone 110 mcg BID   -Albuterol Q6hr PRN     #Obesity:   Not interested in Silver Sneakers but consider trying to get bicycle tires. Has a bike but doesn't use it often because tires are not working well and expensive to replace. Discussed going to bike shop to understand what size his tires are so he can order cheap replacement   -f/u on bike tire   -F/u on salmon photos from summer     #Gout. Last flare up in L big toe last June. Minimizing red meats, spicy foods. Does not drink alcohol. Discussed that oxycodone is not used fo Gout. And given his renal insufficiency, would favor tylenol to ibuprofne.   - continue allopurinol for ppx and colchine for acute flare  -tylenol for pain, minimize NSAIDs     #CKD Baseline around 1.2-1.3 which has been stable for decades. Thought to be due to patient's age and overall muscle mass. No proteinuria  (last tested 02/2016). Counseled against nephrotoxis/NSAIDs       RTC in 4 months     Health Care Maintenance:  Cardiovascular:   AAA (65-60M who smoked):   ASA (79-59 yo w/ >10% CVD risk in 10 yr):   Lipids (all >35, younger if high risk): Last 10/2015: TC 161, TG 101, LDL 96, HDL 45   HbA1c(40-70 mf who are obese): Last A1C 6.0 10/2015   Cancer Screening  Family Hx:   Colon (age 15-75): Last FIT 04/2017 neg. No family of hx colon cancer   Mammogram (50-57F biannual): NA   PAP (21-29 q3y, 30-65 q5y w/ HPV): NA   Lung (55-80yo if >30py and quit w/in 15 yrs): NA   ID  HIV: Last neg 2013   GC/Chlamydia (F<24 yo and high risk older):   Hepatitis serologies:   Hep C (1945-65 + high risk): Neg 2013   Hep B ( (high risk):     Immunizations  Influenza: Declined 04/2017  Tdap: Last 01/2011   Pneumovax (PCV13 age age >23, and a year later PPSV23, 8 weeks later for immunocompromised adults. PCV13 a year after PPSV23 if no PCV13 before) :  Zoster (age >73): Patient declined 04/2017    Bone  Vit D:   Dexa scan (f>65, or high risk):           Problem List:  Patient Active Problem List    Diagnosis Date Noted    Gout [M10.9] 05/27/2011    Obesity, unspecified [E66.9] 11/24/2008     Trying to get medicare before he goes to exercises center up the street from him which takes medicare. Has a bike but doesn't use it often because tires are not working well and expensive to replace. Wants to get stronger. Trialed nutritionist and worked on stopping sweets but still eating sweets.        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 30 tablet 2    Aspirin (ASPIRIN EC LOW DOSE) 81 MG Oral Tab EC Take 1 tablet (81 mg) by mouth daily. To prevent heart attack/stroke. 30 tablet 2    Atorvastatin Calcium 40 MG Oral Tab Take 1 tablet (40 mg) by mouth every evening. To lower cholesterol. 30 tablet 2    Colchicine 0.6 MG Oral Tab Take 2 tablets by mouth at first sign of flare followed by 1 table 1 hour later 30 tablet 0    Fluticasone Propionate 50 MCG/ACT Nasal Suspension Spray 2 sprays into each nostril daily. 1 Inhaler 0    Fluticasone Propionate HFA 110 MCG/ACT Inhalation Aerosol Inhale 1 puff by mouth every 12 hours. 1 Inhaler 2    Ketoconazole 2 % External Shampoo Apply on scalp, lather and rinse off daily 120 mL 1    Triamcinolone Acetonide 0.1 % External Ointment Apply to affected area on neck 2 times a day. 15 g 0     No facility-administered medications prior to visit.            Julieta Bellini, MD  Internal Medicine PGY-2  [facility]

## 2017-10-30 ENCOUNTER — Ambulatory Visit (HOSPITAL_BASED_OUTPATIENT_CLINIC_OR_DEPARTMENT_OTHER): Payer: Medicare HMO | Attending: Internal Medicine

## 2017-10-30 ENCOUNTER — Encounter (HOSPITAL_BASED_OUTPATIENT_CLINIC_OR_DEPARTMENT_OTHER): Payer: Self-pay

## 2017-10-30 DIAGNOSIS — R7303 Prediabetes: Secondary | ICD-10-CM | POA: Insufficient documentation

## 2017-10-30 DIAGNOSIS — Z0000 Encounter for general adult medical examination without abnormal findings: Secondary | ICD-10-CM | POA: Insufficient documentation

## 2017-10-30 DIAGNOSIS — Z6841 Body Mass Index (BMI) 40.0 and over, adult: Secondary | ICD-10-CM

## 2017-10-30 DIAGNOSIS — J45909 Unspecified asthma, uncomplicated: Secondary | ICD-10-CM | POA: Insufficient documentation

## 2017-10-30 DIAGNOSIS — M109 Gout, unspecified: Secondary | ICD-10-CM | POA: Insufficient documentation

## 2017-10-30 DIAGNOSIS — E785 Hyperlipidemia, unspecified: Secondary | ICD-10-CM | POA: Insufficient documentation

## 2017-10-30 MED ORDER — FLUTICASONE PROPIONATE HFA 110 MCG/ACT IN AERO
1.0000 | INHALATION_SPRAY | Freq: Two times a day (BID) | RESPIRATORY_TRACT | 6 refills | Status: DC
Start: 2017-10-30 — End: 2023-07-14

## 2017-10-30 MED ORDER — ATORVASTATIN CALCIUM 40 MG OR TABS
ORAL_TABLET | ORAL | 6 refills | Status: DC
Start: 2017-10-30 — End: 2018-03-09

## 2017-10-30 MED ORDER — ALLOPURINOL 100 MG OR TABS
100.0000 mg | ORAL_TABLET | Freq: Every day | ORAL | 6 refills | Status: DC
Start: 2017-10-30 — End: 2018-06-21

## 2017-10-30 MED ORDER — ALBUTEROL SULFATE HFA 108 (90 BASE) MCG/ACT IN AERS
2.0000 | INHALATION_SPRAY | Freq: Four times a day (QID) | RESPIRATORY_TRACT | 6 refills | Status: DC | PRN
Start: 2017-10-30 — End: 2023-07-14

## 2017-11-02 NOTE — Progress Notes (Signed)
I have personally discussed the case with the resident during or immediately after the patient visit including review of history, physical exam, diagnosis, and treatment plan. I agree with the assessment and plan of care.

## 2017-11-24 ENCOUNTER — Telehealth (HOSPITAL_BASED_OUTPATIENT_CLINIC_OR_DEPARTMENT_OTHER): Payer: Self-pay

## 2017-11-24 NOTE — Telephone Encounter (Signed)
Bilateral foot swelling.  New he has not had this before.  No other symptoms.  Appointment made at patient's convenience on 11/27/17.  He verbalized back date and time of appointment.

## 2017-11-27 ENCOUNTER — Ambulatory Visit (HOSPITAL_BASED_OUTPATIENT_CLINIC_OR_DEPARTMENT_OTHER): Payer: Medicare HMO | Attending: Nurse Practitioner | Admitting: Nurse Practitioner

## 2017-11-27 ENCOUNTER — Encounter (HOSPITAL_BASED_OUTPATIENT_CLINIC_OR_DEPARTMENT_OTHER): Payer: Self-pay

## 2017-11-27 VITALS — BP 123/77 | HR 58 | Temp 96.8°F | Ht 62.01 in | Wt 256.6 lb

## 2017-11-27 DIAGNOSIS — Z6841 Body Mass Index (BMI) 40.0 and over, adult: Secondary | ICD-10-CM

## 2017-11-27 DIAGNOSIS — N182 Chronic kidney disease, stage 2 (mild): Secondary | ICD-10-CM | POA: Insufficient documentation

## 2017-11-27 DIAGNOSIS — I872 Venous insufficiency (chronic) (peripheral): Secondary | ICD-10-CM | POA: Insufficient documentation

## 2017-11-27 LAB — BASIC METABOLIC PANEL
Anion Gap: 6 (ref 4–12)
Calcium: 9.7 mg/dL (ref 8.9–10.2)
Carbon Dioxide, Total: 30 meq/L (ref 22–32)
Chloride: 108 meq/L (ref 98–108)
Creatinine: 1.41 mg/dL — ABNORMAL HIGH (ref 0.51–1.18)
GFR, Calc, African American: >60 mL/min/{1.73_m2} (ref >59)
GFR, Calc, European American: 50 mL/min/{1.73_m2} — ABNORMAL LOW (ref >59)
Glucose: 105 mg/dL (ref 62–125)
Potassium: 4.2 meq/L (ref 3.6–5.2)
Sodium: 144 meq/L (ref 135–145)
Urea Nitrogen: 22 mg/dL — ABNORMAL HIGH (ref 8–21)

## 2017-11-27 NOTE — Progress Notes (Signed)
Subjective:  Nathan [name] presents today with c/os swelling of his LEs.  Has had this previously and usually wears compression stockings, but states the current ones he has are too small.  He is unclear why he has swelling.  Has no heart problems, liver probs, thyroid probs, pain in his LEs.  No open wound on his LEs.  Does have a hx CKD of unclear etiology.      PMHx:   Obesity, unspecified    Unspecified asthma, with status asthmaticus    Generalized osteoarthrosis, unspecified site    Other and unspecified hyperlipidemia    Presbyopia    Gout    Irregular heart beat    Achilles tendinosis of right lower extremity    Mild intermittent asthma without complication     Medications:   Albuterol Sulfate HFA 108 (90 Base) MCG/ACT Inhalation Aero Soln Inhale 2 puffs by mouth every 6 hours as needed for shortness of breath/wheezing. 1 Inhaler 6    allopurinol 100 MG Oral Tab Take 1 tablet (100 mg) by mouth daily. 30 tablet 6    Aspirin (ASPIRIN EC LOW DOSE) 81 MG Oral Tab EC Take 1 tablet (81 mg) by mouth daily. To prevent heart attack/stroke. 30 tablet 2    Atorvastatin Calcium 40 MG Oral Tab Take 1 tablet (40 mg) by mouth every evening. To lower cholesterol. 30 tablet 6    Colchicine 0.6 MG Oral Tab Take 2 tablets by mouth at first sign of flare followed by 1 table 1 hour later 30 tablet 0    Fluticasone Propionate HFA 110 MCG/ACT Inhalation Aerosol Inhale 1 puff by mouth every 12 hours. 1 Inhaler 6    Ketoconazole 2 % External Shampoo Apply on scalp, lather and rinse off daily 120 mL 1    Triamcinolone Acetonide 0.1 % External Ointment Apply to affected area on neck 2 times a day. 15 g 0     Allergies:  NKDA      Habits:  Tobacco: Not now  ETOH:  Quit   Drugs: MJ in the past      Social Hx:  Lives Alone in Apt    Objective:  Gen - Obese AA male in NAD  VS -  Blood pressure 123/77, pulse 58, temperature 96.8 F  weight 257 lbs  Neck - supple without adenopathy, thyroid nodules  Chest - lungs clear to  ausc  Cardiac - RRR without m,g,r  Abd - obese soft NT without megaly  E's - 2+ bilat pitting edema to the knee.  DP and PT pulses 2+ bilaterally.  Feet are warm.  No open wounds  Labs - Last renal fx 2017 with creat 1.32    Assessment:  Prob Chronic Venous Insuff - hopefully not worsening CKD      Plan:  Walking/elevation  New Compression stockings  BMP - Will call with results

## 2018-01-22 ENCOUNTER — Other Ambulatory Visit (HOSPITAL_BASED_OUTPATIENT_CLINIC_OR_DEPARTMENT_OTHER): Payer: Self-pay

## 2018-01-22 DIAGNOSIS — Z0000 Encounter for general adult medical examination without abnormal findings: Secondary | ICD-10-CM

## 2018-01-25 ENCOUNTER — Encounter (HOSPITAL_BASED_OUTPATIENT_CLINIC_OR_DEPARTMENT_OTHER): Payer: Self-pay

## 2018-01-25 MED ORDER — ASPIRIN 81 MG OR TBEC
81.0000 mg | DELAYED_RELEASE_TABLET | Freq: Every day | ORAL | 1 refills | Status: DC
Start: 2018-01-25 — End: 2018-07-07

## 2018-01-25 NOTE — Telephone Encounter (Signed)
Error

## 2018-01-25 NOTE — Telephone Encounter (Signed)
Received call from patient who said he is almost totally out of aspirin and his pharmacy needs more refill orders.

## 2018-03-03 ENCOUNTER — Encounter (HOSPITAL_BASED_OUTPATIENT_CLINIC_OR_DEPARTMENT_OTHER): Payer: Self-pay

## 2018-03-03 ENCOUNTER — Telehealth (HOSPITAL_BASED_OUTPATIENT_CLINIC_OR_DEPARTMENT_OTHER): Payer: Self-pay

## 2018-03-03 ENCOUNTER — Ambulatory Visit (HOSPITAL_BASED_OUTPATIENT_CLINIC_OR_DEPARTMENT_OTHER): Payer: Medicare HMO | Attending: Internal Medicine

## 2018-03-03 VITALS — BP 123/73 | HR 57 | Temp 96.3°F | Ht 62.0 in | Wt 248.8 lb

## 2018-03-03 DIAGNOSIS — Z6841 Body Mass Index (BMI) 40.0 and over, adult: Secondary | ICD-10-CM

## 2018-03-03 DIAGNOSIS — B350 Tinea barbae and tinea capitis: Secondary | ICD-10-CM | POA: Insufficient documentation

## 2018-03-03 DIAGNOSIS — N189 Chronic kidney disease, unspecified: Secondary | ICD-10-CM

## 2018-03-03 DIAGNOSIS — M25512 Pain in left shoulder: Secondary | ICD-10-CM | POA: Insufficient documentation

## 2018-03-03 DIAGNOSIS — N182 Chronic kidney disease, stage 2 (mild): Secondary | ICD-10-CM | POA: Insufficient documentation

## 2018-03-03 DIAGNOSIS — Z0000 Encounter for general adult medical examination without abnormal findings: Secondary | ICD-10-CM | POA: Insufficient documentation

## 2018-03-03 LAB — CBC (HEMOGRAM)
Hematocrit: 44 % (ref 38–50)
Hemoglobin: 14.7 g/dL (ref 13.0–18.0)
MCH: 29.3 pg (ref 27.3–33.6)
MCHC: 33.3 g/dL (ref 32.2–36.5)
MCV: 88 fL (ref 81–98)
Platelet Count: 204 10*3/uL (ref 150–400)
RBC: 5.01 10*6/uL (ref 4.40–5.60)
RDW-CV: 13.6 % (ref 11.6–14.4)
WBC: 4.44 10*3/uL (ref 4.30–10.00)

## 2018-03-03 LAB — BASIC METABOLIC PANEL
Anion Gap: 7 (ref 4–12)
Calcium: 9.3 mg/dL (ref 8.9–10.2)
Carbon Dioxide, Total: 25 meq/L (ref 22–32)
Chloride: 110 meq/L — ABNORMAL HIGH (ref 98–108)
Creatinine: 1.34 mg/dL — ABNORMAL HIGH (ref 0.51–1.18)
GFR, Calc, African American: >60 mL/min/{1.73_m2} (ref >59)
GFR, Calc, European American: 54 mL/min/{1.73_m2} — ABNORMAL LOW (ref >59)
Glucose: 113 mg/dL (ref 62–125)
Potassium: 4 meq/L (ref 3.6–5.2)
Sodium: 142 meq/L (ref 135–145)
Urea Nitrogen: 17 mg/dL (ref 8–21)

## 2018-03-03 MED ORDER — KETOCONAZOLE 2 % EX SHAM
MEDICATED_SHAMPOO | CUTANEOUS | 2 refills | Status: DC
Start: 2018-03-03 — End: 2019-05-20

## 2018-03-03 NOTE — Progress Notes (Signed)
I have personally discussed the case with the resident during or immediately after the patient visit including review of history, physical exam, diagnosis, and treatment plan. I agree with the assessment and plan of care.

## 2018-03-03 NOTE — Telephone Encounter (Signed)
(  TEXTING IS AN OPTION FOR [facility] ONLY)  Is this a [facility]? No      RETURN CALL: Detailed message on voicemail only      SUBJECT:  General Message     REASON FOR REQUEST: Appointment    MESSAGE: Running about 15 minutes late to his 1:30 appointment

## 2018-03-03 NOTE — Progress Notes (Addendum)
[facility]       CC: Mr. Leichter is a 67 year old English speaking male w/ history of asthma, mild CKD, gouthere for follow up     CHIEF COMPLAINT: Other (Follow up)    HPI:   Last seen by me in clinic 10/2017. At that time. At that time he wanted med refills and had changed insurance from [place] to [facility] and Medicare so didn't want to be on too many meds. He saw Marlis Edelson 11/27/17 due to BLE swelling. Got new compression stockings and BMP which showed Cr 1.4 (last 1.3).     He went fishing at [employer] near [place] and got a big salmon which he gave away and two got away.     L shoulder discomfort:   His left shoulder has been bothering him for a while now. He needs to shake his arm because of the discomfort. It's like there's a numbness in his arm, like a delayed reaction. Almost like it's weak and numb. He notices it when He's been using a special pillow that's causing worse L shoulder/arm discomfort.     Medications:   Wants ketoconazole shampoo refilled. Aspirin he takes as a "blood thinner" for irregular heart beat. EKG showing sinus brady with PVC in 2016 and NSR in 2014. Could not find records for when he needed aspirin. Discussed he doesn't need aspirin but he wants to keep it and take it once a week or once every two weeks.     Lifestyle   He's been eating a lot of vegetables and it makes him less tired.  He's cut down on sweets. Hasn't been able to get bike tires because bike tires $25-30 each. Waiting till next April to buy tires and inner tubes. His section 8 is getting renewed.     Physical Examination:   BP 123/73    Pulse 57    Temp 96.3 F (35.7 C) (Temporal)    Ht 5\' 2"  (1.575 m)    Wt (!) 248 lb 12.8 oz (112.9 kg)    BMI 45.51 kg/m   GENERAL: Comfortable appearing AA gentleman in NAD who responds appropriately to questions. BMI  45  HEENT: Sclerae anicteric. Oral and nasal mucosa pink and moist. Oropharynx without exudates or erythema  NECK: Soft, NT, no LAD  CHEST: Comfortable  WOB. Lungs clear to auscultation B/L  CV: RRR, nl S1/S2, no m/r/g. No LE edema  ABD: BT+. Soft, NT, ND  NEURO: Appropriate gait. Using all extremities appropriately and purposefully  MSK: Normal bulk and tone in all extremities. Supraspinatous strong except for some reproducible sensation of weakness in L arm on empty drop can. + Neers on L arm. Otherwise full ROM in passive and active range of motion. Full strength otherwise. Normal Hawkins and AC joint.     Assessment/Plan:   Mr. Cardenas is a 67 year old English speaking male history of asthma, mild CKD, gouthere for follow up     #L arm/shoulder numbness:  No true weakness on strength exam though does have some reproducible discomfort on neers and drop can/supraspinatous strength testing. Ddx include impingement vs cervical radiculopathy (given malpositioning of head during sleep with special pillow exacerbates this) vs less likely demyelinating peripheral neuropathy. Zakir declines PT for now as he would like to try his own exercises and it doesn't bother him enough for PT. Gave return precautions and we can consider PT if worse. Also can consider EMG though limited utility at this  time given no true sensation change or true weakness.     #Obesity:   Trying to get bike tires for his bike maybe next spring as they are expensive. Eating less sugar (coke zero, vegetables)    -Repeat A1C (6.3)   -continue MV around bike tires, exercise, healthy eating.     #CKD Baseline around 1.2-1.4 which has been stable for decades. Recent check 1.4. 11/27/17. Thought to be due to patient's age and overall muscle mass. No proteinuria (last tested 02/2016).   -Repeat BMP (rechecked 1.3, sent letter to [name])       #Tinea Capitus:   -Refilled ketoconazole shampoo     To do next time:   -FIT testing  -PSA testing  -Vaccines (prevnar, zoster, flu?)     --Chronic issues--  #Asthma with h/o status asthmaticus  Stable   -Fluticasone 110 mcg BID   -Albuterol Q6hr PRN     #Gout.Last  flare up in L big toe last June 2017. Minimizing red meats, spicy foods. Does not drink alcohol.And given his renal insufficiency, would favor tylenol to ibuprofen.   -continue allopurinol for ppx and colchine for acute flare  -tylenol for pain, minimize NSAIDs       Health Care Maintenance:  Cardiovascular:   AAA (65-77M who smoked):  ASA (96-59 yo w/ >10% CVD risk in 10 yr):   Lipids (all >35, younger if high risk):Last 10/2015: TC 161, TG 101, LDL 96, HDL 45. On Atorva 40mg  Qdaily   HbA1c(40-70 mf who are obese):Last A1C 6.3 02/2018  Cancer Screening  Family Hx:   Colon (age 70-75):Last FIT10/2018neg. No family of hx colon cancer  Mammogram (50-34F biannual):NA  PAP (21-29 q3y, 30-65 q5y w/ HPV):NA  Lung (55-80yo if >30py and quit w/in 15 yrs):NA  PSA:   ID  CVE:LFYB neg 2013  GC/Chlamydia (F<24 yo and high risk older):   Hepatitis serologies:   Hep C (1945-65 + high risk):Neg 2013  Hep B ( (high risk):     Immunizations  Influenza:Declined 04/2017  Tdap:Last 01/2011  Pneumovax (PCV13 age age >53, and a year later PPSV23, 8 weeks later for immunocompromised adults. PCV13 a year after PPSV23 if no PCV13 before) :  Zoster (age >60):Patient declined10/2018  Bone  Vit D:   Dexa scan (f>65, or high risk):       Problem List:  Patient Active Problem List    Diagnosis Date Noted    Gout [M10.9] 05/27/2011    Obesity, unspecified [E66.9] 11/24/2008     Trying to get medicare before he goes to exercises center up the street from him which takes medicare. Has a bike but doesn't use it often because tires are not working well and expensive to replace. Wants to get stronger. Trialed nutritionist and worked on stopping sweets but still eating sweets.        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 1 Inhaler 6    allopurinol 100 MG Oral Tab Take 1 tablet (100 mg) by mouth daily. 30 tablet 6    Aspirin (ASPIRIN EC LOW DOSE) 81 MG Oral Tab EC Take 1 tablet (81 mg) by mouth daily. To prevent heart attack/stroke. 90 tablet 1    Atorvastatin Calcium 40 MG Oral Tab Take 1 tablet (40 mg) by mouth every evening. To lower cholesterol. 30 tablet 6    Colchicine 0.6 MG Oral Tab Take 2 tablets by mouth at first sign of flare followed by 1 table 1 hour later 30 tablet 0    Fluticasone Propionate HFA 110 MCG/ACT Inhalation Aerosol Inhale 1 puff by mouth every 12 hours. 1 Inhaler 6    Ketoconazole 2 % External Shampoo Apply on scalp, lather and rinse off daily 120 mL 1    Triamcinolone Acetonide 0.1 % External Ointment Apply to affected area on neck 2 times a day. 15 g 0     No facility-administered medications prior to visit.            Julieta Bellini, MD  Internal Medicine PGY-3  [facility]

## 2018-03-03 NOTE — Patient Instructions (Signed)
-  Please get your labs done  -You can stop taking your aspirin  -We think you have impingement causing your shoulder numbness, if things don't get better we can try physical therapy   -I'll see you in 3 months

## 2018-03-04 LAB — HEMOGLOBIN A1C, HPLC: Hemoglobin A1C: 6.3 % — ABNORMAL HIGH (ref 4.0–6.0)

## 2018-03-04 NOTE — Telephone Encounter (Signed)
Patient arrived for appointment.

## 2018-03-09 ENCOUNTER — Telehealth (HOSPITAL_BASED_OUTPATIENT_CLINIC_OR_DEPARTMENT_OTHER): Payer: Self-pay

## 2018-03-09 DIAGNOSIS — R7303 Prediabetes: Secondary | ICD-10-CM

## 2018-03-09 DIAGNOSIS — E785 Hyperlipidemia, unspecified: Secondary | ICD-10-CM

## 2018-03-09 MED ORDER — ATORVASTATIN CALCIUM 40 MG OR TABS
ORAL_TABLET | ORAL | 1 refills | Status: DC
Start: 2018-03-09 — End: 2018-07-07

## 2018-03-09 NOTE — Telephone Encounter (Signed)
(  TEXTING IS AN OPTION FOR [facility] ONLY)  Is this a [facility]? No      RETURN CALL: Detailed message on voicemail only      SUBJECT:  Refill Request    MEDICATION(S): Atorvastatin   NEEDED BY: Next 7-8 days   PRESCRIBING PROVIDER: Laurette Schimke, MD    PHARMACY NAME AND LOCATION: [facility] [phone] [place] [address]       PHARMACY PHONE: [phone]  PHARMACY FAX NUMBER: [phone]  ADDITIONAL INFORMATION: Patient is looking for a 60 - 90 day refill.

## 2018-06-19 ENCOUNTER — Telehealth (HOSPITAL_BASED_OUTPATIENT_CLINIC_OR_DEPARTMENT_OTHER): Payer: Self-pay

## 2018-06-19 DIAGNOSIS — M109 Gout, unspecified: Secondary | ICD-10-CM

## 2018-06-19 NOTE — Telephone Encounter (Signed)
RETURN CALL: Voicemail - Detailed Message      SUBJECT:  Refill Request    NAME OF MEDICATION(S): allopurinol 100 MG Oral Tab  DATE NEEDED BY: ASAP   PRESCRIBING PROVIDER: Laurette Schimke, MD   PHARMACY NAME/LOCATION: [facility] [phone] [phone] [id number]   ADDITIONAL INFORMATION: Patient only has 4 pills left.  Will need refill is less than 2 business days.

## 2018-06-21 MED ORDER — ALLOPURINOL 100 MG OR TABS
100.0000 mg | ORAL_TABLET | Freq: Every day | ORAL | 2 refills | Status: DC
Start: 2018-06-21 — End: 2018-07-07

## 2018-06-21 NOTE — Telephone Encounter (Signed)
Refill request received for Allopurinol has been approved. However last found Uric Acid was on 11/07/15.  Next visit on 07/07/18.  Forward to PCP to review.

## 2018-07-07 ENCOUNTER — Encounter (HOSPITAL_BASED_OUTPATIENT_CLINIC_OR_DEPARTMENT_OTHER): Payer: Self-pay

## 2018-07-07 ENCOUNTER — Ambulatory Visit (HOSPITAL_BASED_OUTPATIENT_CLINIC_OR_DEPARTMENT_OTHER): Payer: Medicare HMO | Attending: Internal Medicine

## 2018-07-07 VITALS — BP 127/74 | HR 55 | Temp 97.9°F | Resp 16 | Ht 62.0 in | Wt 255.0 lb

## 2018-07-07 DIAGNOSIS — R7303 Prediabetes: Secondary | ICD-10-CM | POA: Insufficient documentation

## 2018-07-07 DIAGNOSIS — Z0000 Encounter for general adult medical examination without abnormal findings: Secondary | ICD-10-CM | POA: Insufficient documentation

## 2018-07-07 DIAGNOSIS — E785 Hyperlipidemia, unspecified: Secondary | ICD-10-CM | POA: Insufficient documentation

## 2018-07-07 DIAGNOSIS — Z6841 Body Mass Index (BMI) 40.0 and over, adult: Secondary | ICD-10-CM

## 2018-07-07 DIAGNOSIS — M1A3790 Chronic gout due to renal impairment, unspecified ankle and foot, without tophus (tophi): Secondary | ICD-10-CM | POA: Insufficient documentation

## 2018-07-07 DIAGNOSIS — M109 Gout, unspecified: Secondary | ICD-10-CM | POA: Insufficient documentation

## 2018-07-07 LAB — URIC ACID, SERUM: Uric Acid: 7.4 mg/dL (ref 3.9–7.6)

## 2018-07-07 MED ORDER — ALLOPURINOL 100 MG OR TABS
200.0000 mg | ORAL_TABLET | Freq: Every day | ORAL | 3 refills | Status: DC
Start: 2018-07-07 — End: 2019-08-10

## 2018-07-07 MED ORDER — ALLOPURINOL 100 MG OR TABS
100.0000 mg | ORAL_TABLET | Freq: Every day | ORAL | 3 refills | Status: DC
Start: 2018-07-07 — End: 2018-07-07

## 2018-07-07 MED ORDER — ATORVASTATIN CALCIUM 40 MG OR TABS
ORAL_TABLET | ORAL | 3 refills | Status: DC
Start: 2018-07-07 — End: 2019-08-22

## 2018-07-07 MED ORDER — COLCHICINE 0.6 MG OR TABS
ORAL_TABLET | ORAL | 0 refills | Status: DC
Start: 2018-07-07 — End: 2022-01-15

## 2018-07-07 NOTE — Addendum Note (Signed)
Addended byLaurette Schimke on: 71/29/2909 03:38 PM     Modules accepted: Orders

## 2018-07-07 NOTE — Progress Notes (Addendum)
[facility]       CC: Nathan [name] is a 67 year old [place] speaking man who likes to go salmon fishing w/ history of asthma, mild CKD, gouthere for follow up    CHIEF COMPLAINT: Refill Request    HPI:   Nathan [name] was last seen by me in clinic 03/03/18. At that time he had some L arm and shoulder numbness that was felt to be impingement vs cervical radiculopathy and he declined PT in favor of home exercises at the time.     He's had a headache. He's also been wheezing with his cold and he uses his albuterol.     With [facility], he got vitamin C 500mg . He used to take them a while ago and not sure he needs. He uses icy and heat packs for knee and ankle.      He likes to eat black licorice and wonders if there are any health benefits.     Physical Examination:   BP 127/74   Pulse 55   Temp 97.9 F (36.6 C) (Temporal)   Resp 16   Ht 5\' 2"  (1.575 m)   Wt (!) 255 lb (115.7 kg)   BMI 46.64 kg/m   GENERAL: Comfortable appearing pleasant man, chatty with some questions   HEENT: Sclerae anicteric. Oral and nasal mucosa pink and moist.   NECK: Soft, NT, no LAD  CHEST: Comfortable WOB. Eupneic on room air   NEURO: Appropriate gait. Using all extremities appropriately and purposefully  MSK: Normal bulk and tone in all extremities      Assessment/Plan:   Nathan [name] is a 67 year old [place] speaking man who likes to go salmon fishing w/ history of asthma, mild CKD, gouthere for follow up    1. 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 Will rx some colchicine on hand just in case per patient. Will check uric acid for allopurinol. Last flare up in L big toe last June 2017.Minimizing red meats, spicy foods. Does not drink alcohol.And given his renal insufficiency, would favor tylenol to ibuprofen.  - colchicine 0.6 MG tablet; Take 2 tablets by mouth at first sign of flare followed by 1 table 1 hour later  Dispense: 30 tablet; Refill: 0  - allopurinol  100 MG tablet; Take 1 tablet (100 mg) by mouth daily.  Dispense: 90 tablet; Refill: 3  - Uric Acid. Goal <6   -Tylenol for pain, minimize NSAIDs    Addendum: Uric acid 7.4 called Nathan [name] to ask him to double up on his allopurinol to 200mg  Qdaily. We will recheck uric acid next time.     2. Hyperlipidemia, unspecified hyperlipidemia type  - atorvastatin 40 MG tablet; Take 1 tablet (40 mg) by mouth every evening. To lower cholesterol.  Dispense: 90 tablet; Refill: 3    3. Healthcare maintenance  Fit test today. He declines PSA   - Occult Blood By IA, Stool; Future    4. Asthma with h/o status asthmaticus  Stable   -Fluticasone 110 mcg BID   -Albuterol Q6hr PRN    Did not discuss  ---    #Obesity:  Trying to get bike tires for his bike maybe next spring as they are expensive.Eating less sugar (coke zero, vegetables)    -Repeat A1C (6.3)   -continue MV around bike tires, exercise, healthy eating.     #CKDBaseline around 1.2-1.4 which has been stable for decades. Thought to be due to  patient's age and overall muscle mass. No proteinuria (last tested 02/2016).     #Tinea Capitus:   -Refilled ketoconazole shampoo           Health Care Maintenance:  Cardiovascular:   AAA (65-88M who smoked):  ASA (64-59 yo w/ >10% CVD risk in 10 yr):   Lipids (all >35, younger if high risk):Last 10/2015: TC 161, TG 101, LDL 96, HDL 45. On Atorva 40mg  Qdaily   HbA1c(40-70 mf who are obese):Last A1C 6.3 02/2018  Cancer Screening  Family Hx:   Colon (age 92-75):Last FIT10/2018neg. Given FIT card 06/2018 No family of hx colon cancer  Mammogram (50-62F biannual):NA  PAP (21-29 q3y, 30-65 q5y w/ HPV):NA  Lung (55-80yo if >30py and quit w/in 15 yrs):NA  PSA: No prostate cancer in family. Declines PSA after discussion of risks and benefits 06/2018   ID  LKG:MWNU neg 2013  GC/Chlamydia (F<24 yo and high risk older):   Hepatitis serologies:   Hep C (1945-65 + high risk):Neg 2013  Hep B ( (high risk):      Immunizations  Influenza:Declined 04/2017  Tdap:Last 01/2011  Pneumovax (PCV13 age age >8, and a year later PPSV23, 8 weeks later for immunocompromised adults. PCV13 a year after PPSV23 if no PCV13 before) :Had pneumovax 23 01/2009.   Zoster (age >60):Patient declined10/2018  Bone  Vit D:   Dexa scan (f>65, or high risk):         Problem List:  Patient Active Problem List    Diagnosis Date Noted   . Gout [M10.9] 05/27/2011   . Obesity, unspecified [E66.9] 11/24/2008     Trying to get medicare before he goes to exercises center up the street from him which takes medicare. Has a bike but doesn't use it often because tires are not working well and expensive to replace. Wants to get stronger. Trialed nutritionist and worked on stopping sweets but still eating sweets.       Marland Kitchen Unspecified asthma, with status asthmaticus [J45.902] 11/24/2008   . Generalized osteoarthrosis, unspecified site [M15.9] 11/24/2008   . Other and unspecified hyperlipidemia [E78.5] 11/24/2008   . Presbyopia [H52.4] 11/24/2008   . Mild intermittent asthma without complication [U72.53] 66/44/0347     Diagnosed when he was in his teens and smoking. Has had PFTs in 1980s or 1990s with what sounds like positive bronchodilator effect. Quit smoking. May have component of COPD but difficult to tell at this point. Well controlled on fluticasone, unclear if he uses his albuterol very much or understands how to use it. No need for PFTs now. Refilled fluticasone inhaler and albuterol prn. Discussed how to use both.        . 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 Irregular heart beat [I49.9] 10/05/2012     Sinus brady (56) with frequent premature ventricular complexes in pattern of bigeminy on EKG June 2016         Outpatient Medications Prior to Visit   Medication Sig Dispense Refill   . Albuterol Sulfate HFA 108 (90 Base) MCG/ACT Inhalation Aero Soln Inhale 2 puffs by mouth every 6 hours as needed for shortness of  breath/wheezing. 1 Inhaler 6   . allopurinol 100 MG tablet Take 1 tablet (100 mg) by mouth daily. 30 tablet 2   . Aspirin (ASPIRIN EC LOW DOSE) 81 MG Oral Tab EC Take 1 tablet (81 mg) by mouth daily. To prevent heart attack/stroke. 90 tablet 1   . atorvastatin 40 MG Oral  Tab Take 1 tablet (40 mg) by mouth every evening. To lower cholesterol. 90 tablet 1   . Colchicine 0.6 MG Oral Tab Take 2 tablets by mouth at first sign of flare followed by 1 table 1 hour later 30 tablet 0   . Fluticasone Propionate HFA 110 MCG/ACT Inhalation Aerosol Inhale 1 puff by mouth every 12 hours. 1 Inhaler 6   . ketoconazole 2 % External Shampoo Apply on scalp, lather and rinse off daily 120 mL 2   . Triamcinolone Acetonide 0.1 % External Ointment Apply to affected area on neck 2 times a day. 15 g 0     No facility-administered medications prior to visit.            Julieta Bellini, MD  Internal Medicine PGY-3  [facility]

## 2018-07-07 NOTE — Progress Notes (Signed)
Fecal Occult Blood Test    Ordered by: Stan Head, MD  Primary Learner: Patient  Challenges: None  Motivation of Learning: Engaging with Education  Method of Teaching: Discussion with Patient  Topic Taught: Fecal Occult Blood test x 1 to take home, instructed patient to turn in specimen as soon as possible, Exp.01/2018  Post Education Response: Patient states understanding  Total time spent teaching: 5 minutes

## 2018-07-08 NOTE — Progress Notes (Signed)
I have personally discussed the case with the resident during or immediately after the patient visit including review of history, physical exam, diagnosis, and treatment plan. I agree with the assessment and plan of care.

## 2018-07-23 ENCOUNTER — Other Ambulatory Visit (HOSPITAL_BASED_OUTPATIENT_CLINIC_OR_DEPARTMENT_OTHER): Payer: Self-pay

## 2018-07-23 ENCOUNTER — Other Ambulatory Visit (HOSPITAL_BASED_OUTPATIENT_CLINIC_OR_DEPARTMENT_OTHER)
Admit: 2018-07-23 | Discharge: 2018-07-23 | Disposition: A | Discharge status: Home / Self Care | Payer: Medicare HMO | Attending: Internal Medicine | Admitting: Internal Medicine

## 2018-07-23 DIAGNOSIS — Z0000 Encounter for general adult medical examination without abnormal findings: Secondary | ICD-10-CM

## 2018-07-26 LAB — OCCULT BLOOD BY IA, STL: Occult Bld 1 Result: NEGATIVE

## 2018-07-28 ENCOUNTER — Encounter (HOSPITAL_BASED_OUTPATIENT_CLINIC_OR_DEPARTMENT_OTHER): Payer: Self-pay

## 2018-11-01 ENCOUNTER — Telehealth (HOSPITAL_BASED_OUTPATIENT_CLINIC_OR_DEPARTMENT_OTHER): Payer: Self-pay

## 2018-11-01 NOTE — Telephone Encounter (Signed)
RETURN CALL: Voicemail - Detailed Message      SUBJECT:  General Message     MESSAGE:    Please call patient to clarify if he should cancel or schedule a phone visit for 4/16. He erased the last voice message and wasn't sure what the clinic would like him to do for his coming up appointment.

## 2018-11-01 NOTE — Telephone Encounter (Signed)
Called pt to clarify if pt wants to verify apt t on 4/16, but eceived a message  " Subscriber you called is not in service."

## 2018-11-04 ENCOUNTER — Telehealth (HOSPITAL_BASED_OUTPATIENT_CLINIC_OR_DEPARTMENT_OTHER): Payer: Self-pay

## 2018-11-04 ENCOUNTER — Encounter (HOSPITAL_BASED_OUTPATIENT_CLINIC_OR_DEPARTMENT_OTHER): Payer: Medicare HMO

## 2018-11-04 NOTE — Telephone Encounter (Signed)
Patient would like to reschedule his appointment for today. Thank you.

## 2018-11-04 NOTE — Telephone Encounter (Signed)
Called pt to schedule a phone visit, but the recording said that the subscriber you called is not inservice

## 2018-11-04 NOTE — Telephone Encounter (Signed)
Left vm asing pt to call [phone] to schedule phone/telemed appointment

## 2018-11-04 NOTE — Telephone Encounter (Signed)
Left vm asking pt to call [phone] to schedule phone/telemed visit

## 2018-11-04 NOTE — Telephone Encounter (Signed)
Dolores called in returning a call he missed from the clinic, he would like an appointment to be set up for him any day between the hours of 11 am and 1-2 pm. He does not answer blocked calls so if he doesn't answer he prefers a message with appointment be left.

## 2018-11-05 ENCOUNTER — Telehealth (HOSPITAL_BASED_OUTPATIENT_CLINIC_OR_DEPARTMENT_OTHER): Payer: Medicare HMO | Admitting: Internal Medicine

## 2019-04-19 ENCOUNTER — Encounter (HOSPITAL_BASED_OUTPATIENT_CLINIC_OR_DEPARTMENT_OTHER): Payer: Medicare HMO

## 2019-04-19 NOTE — Progress Notes (Deleted)
[facility]IN[facility]  OUTPATIENT NOTE    DATE: 04/19/2019    ID/CHIEF COMPLAINT:  Nathan [name] is a 68 year old year old [place] speaking male who presents today to re-establish care.     INTERVAL HISTORY/HPI:  68 y/o M with a history of asthma, mild CKD (baseline Cr 1.2-1.4), gout, and obesity (BMI 47) here to re-establish care.    Former patient of Dr. Silver [name]. Since his last visit at [facility] he has been ***    PROBLEM LIST/PMH:  Patient Active Problem List   Diagnosis   . Obesity, unspecified   . Unspecified asthma, with status asthmaticus   . Generalized osteoarthrosis, unspecified site   . Other and unspecified hyperlipidemia   . Presbyopia   . Gout   . Irregular heart beat   . Achilles tendinosis of right lower extremity   . Mild intermittent asthma without complication       MEDICATIONS:  Current Outpatient Medications   Medication Sig Dispense Refill   . Albuterol Sulfate HFA 108 (90 Base) MCG/ACT Inhalation Aero Soln Inhale 2 puffs by mouth every 6 hours as needed for shortness of breath/wheezing. 1 Inhaler 6   . allopurinol 100 MG tablet Take 2 tablets (200 mg) by mouth daily. 60 tablet 3   . atorvastatin 40 MG tablet Take 1 tablet (40 mg) by mouth every evening. To lower cholesterol. 90 tablet 3   . colchicine 0.6 MG tablet Take 2 tablets by mouth at first sign of flare followed by 1 table 1 hour later 30 tablet 0   . Fluticasone Propionate HFA 110 MCG/ACT Inhalation Aerosol Inhale 1 puff by mouth every 12 hours. 1 Inhaler 6   . ketoconazole 2 % External Shampoo Apply on scalp, lather and rinse off daily 120 mL 2   . Triamcinolone Acetonide 0.1 % External Ointment Apply to affected area on neck 2 times a day. 15 g 0     No current facility-administered medications for this visit.        ALLERGIES:  Review of patient's allergies indicates:  No Known Allergies    REVIEW OF SYSTEMS:    Complete ROS obtained and all negative with the exception of what is listed in the HPI.      PHYSICAL EXAM:  There were no vitals  taken for this visit.    Wt Readings from Last 5 Encounters:   07/07/18 (!) 255 lb (115.7 kg)   03/03/18 (!) 248 lb 12.8 oz (112.9 kg)   11/27/17 (!) 256 lb 9.6 oz (116.4 kg)   10/30/17 (!) 253 lb (114.8 kg)   07/24/17 (!) 257 lb 3.2 oz (116.7 kg)     BP Readings from Last 5 Encounters:   07/07/18 127/74   03/03/18 123/73   11/27/17 123/77   10/30/17 139/89   07/24/17 167/74       GEN: NAD ***  HEENT: PERRLA, EOMI, no conjunctival icterus, pallor or injection, no cervical or supraclavicular LAD, good dentition with no oral lesions or pharyngeal erythema ***  SKIN: No rashes, no nail changes ***  CV: RRR, Normal S1 & S2, with no R/M/G. No JVD noted. No clubbing, cyanosis, or edema.  RESP: Easy work of breathing. CTAB, without crackles, wheezes, or rhonchi ***  GI: Soft, NT/ND, BS+, no HSM  MSK: Normal muscle bulk and tone, no tenderness ***  PSYCH:   Appropriate and euthymic   Neuro: Alert and fully oriented, CN II-XII grossly intact, normal gait ***  LAB RESULTS:  Orders Only on 07/23/18   1. Occult Blood By IA, Stool   Result Value Ref Range    Occult Bld 1 Result Negative NRN         RELEVANT IMAGING:    ASSESSMENT AND PLAN:  68 y/o M with a history of asthma, mild CKD (baseline Cr 1.2-1.4), gout, and obesity (BMI 47) here to re-establish care.            Health Care Maintenance:  Cardiovascular:   ASA:   Lipids: (men > 35 years; Men or Women > 20 years who have cardiovascular risk factures) (Q5 years if normal):   Results for orders placed or performed in visit on 11/07/15   LIPID PANEL   Result Value Ref Range    Cholesterol (Total) 161 <200 mg/dL    Triglyceride 101 <150 mg/dL    Cholesterol (HDL) 45 >39 mg/dL    Cholesterol (LDL) 96 <130 mg/dL    Non-HDL Cholesterol 116 0 - 159 mg/dL    Cholesterol/HDL Ratio 3.6     Lipid Panel, Additional Info. (NOTE)      ASCVD Risk Score:  HbA1c:   Lab Results   Component Value Date    A1C 6.3 03/03/2018     AAA: Ordered today  Tobacco:   Cancer Screening   Family Hx:    Colon: FIT next due 07/2019  PSA (if symptomatic): no Sx    ID   TB: risk factors?  HIV: (age 44-64) .No results found for this or any previous visit.  Hepatitis serologies:   Hep A: (chronic liver disease, MSM)  Hep B: (risk factors, travel, chronic liver disease, health care workers, all diabetics younger than 44)  Hep C: (born between 65-1965)  Immunizations  (Influenza yearly, TDaP q10 years, Pneumovax: (age 63 or >, > 50 if smoker), Prevnar (age >56), Zoster (age >64)  Immunization History   Administered Date(s) Administered   . Hep B, unspecified 10/05/2007, 11/05/2007, 05/08/2008   . Influenza quadrivalent PF 04/19/2013, 06/12/2014, 05/01/2015   . Influenza trivalent PF 07/07/2012   . Influenza, unspecified 09/26/2010   . Tdap vaccine 01/21/2011   . pneumococcal (Pneumovax 23) polysaccharide vaccine 02/12/2009       RTC:  ***    This patient was seen and discussed with Dr. Marland Kitchen  __________________________________________________

## 2019-05-19 ENCOUNTER — Other Ambulatory Visit (HOSPITAL_BASED_OUTPATIENT_CLINIC_OR_DEPARTMENT_OTHER): Payer: Self-pay

## 2019-05-19 DIAGNOSIS — B350 Tinea barbae and tinea capitis: Secondary | ICD-10-CM

## 2019-05-20 MED ORDER — KETOCONAZOLE 2 % EX SHAM
MEDICATED_SHAMPOO | CUTANEOUS | 0 refills | Status: DC
Start: 2019-05-20 — End: 2019-12-26

## 2019-05-20 NOTE — Telephone Encounter (Signed)
Patient has pending appt, rx sent under attending from last visit

## 2019-07-03 IMAGING — MR MRI RIGHT SHOULDER WITHOUT CONTRAST
4 of 6 series · 19 of 40 positions shown · IV contrast (gadolinium)
Comparison: None.

MRI RIGHT SHOULDER WITHOUT CONTRAST, 07/03/2019 [DATE]: 
CLINICAL INDICATION: Evaluate possible recurrent rotator cuff tear.
TECHNIQUE: Multiplanar, multiecho position MR images of the right shoulder were 
performed without intravenous gadolinium enhancement.

[Series 201: survey_right · axial · 10.0mm · 0.99mm/px · z∈[-40,+175]mm · 5 of 15 slices shown]
[im 1/15]
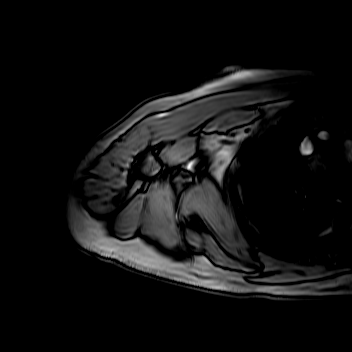
[im 4/15]
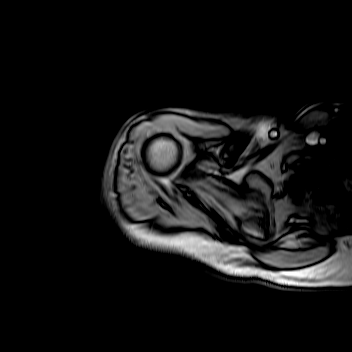
[im 8/15]
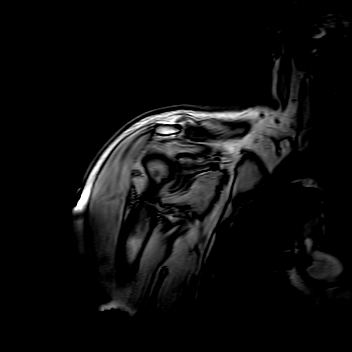
[im 11/15]
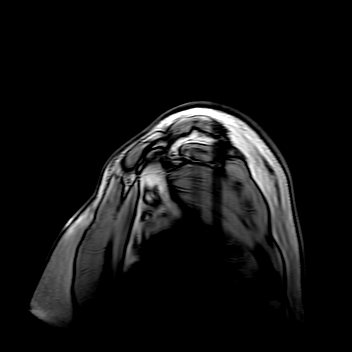
[im 15/15]
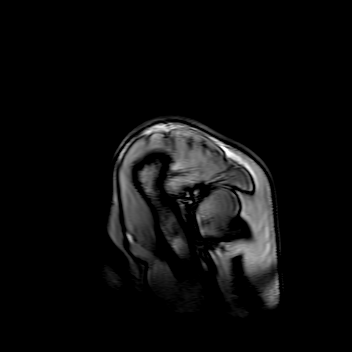

[Series 301: pdw spair ax · axial · 2.2mm · 0.25mm/px · z∈[-17,+73]mm · 8 of 40 slices shown]
[im 1/40]
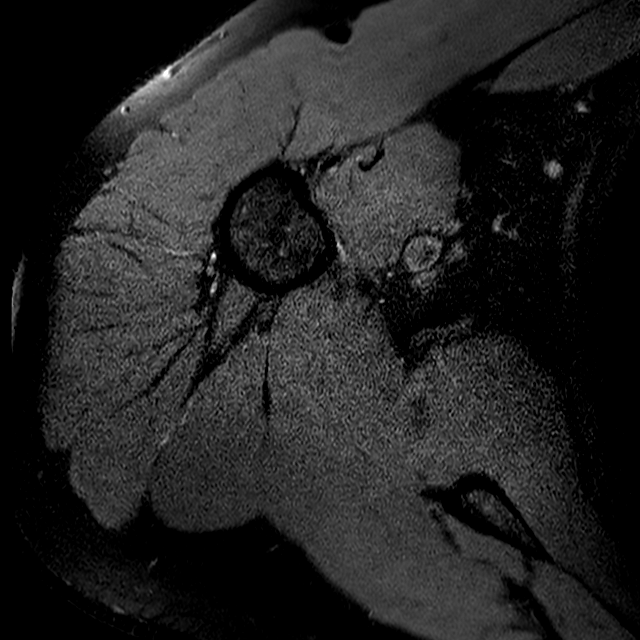
[im 5/40]
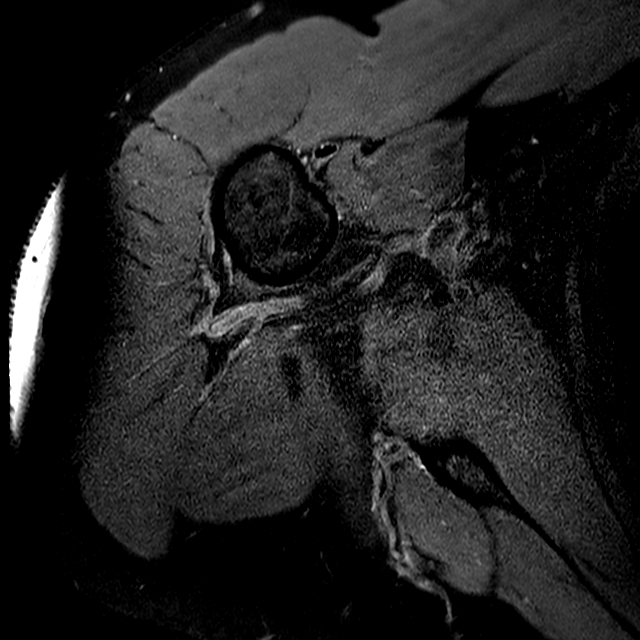
[im 10/40]
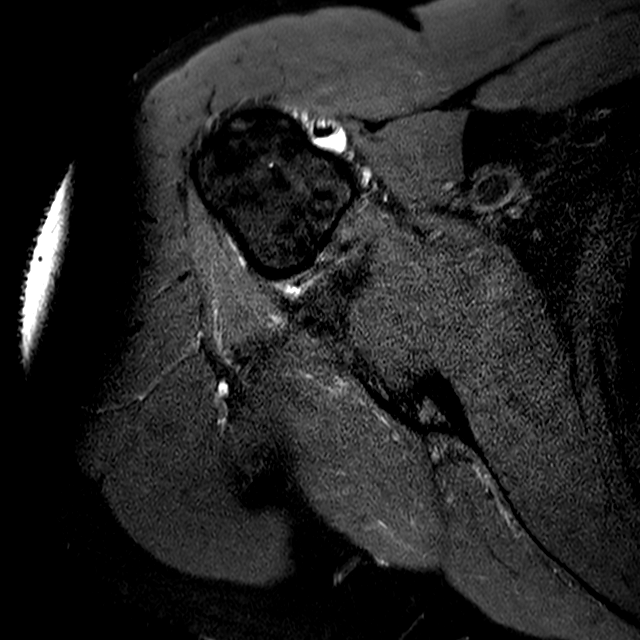
[im 15/40]
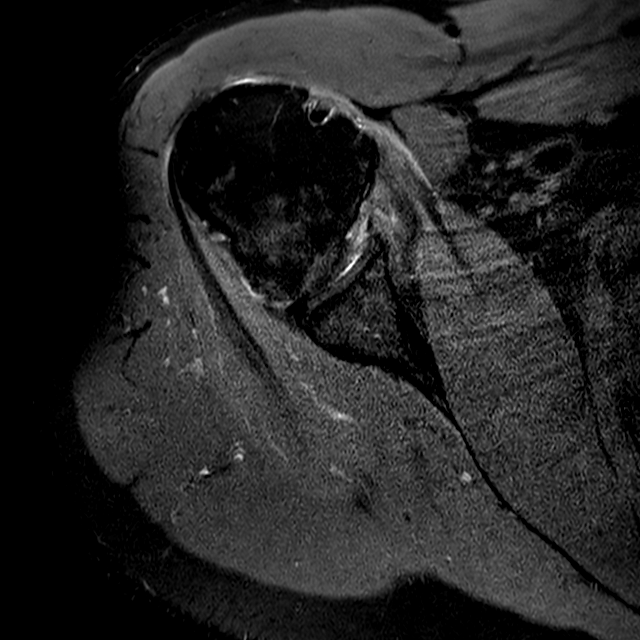
[im 20/40]
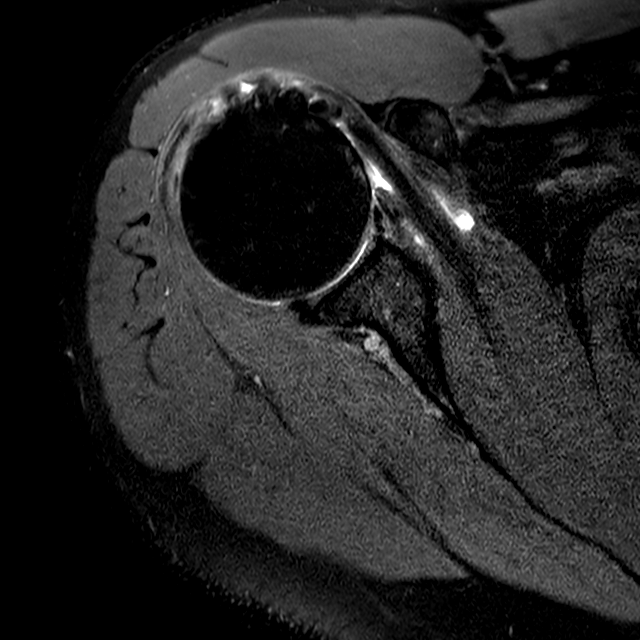
[im 25/40]
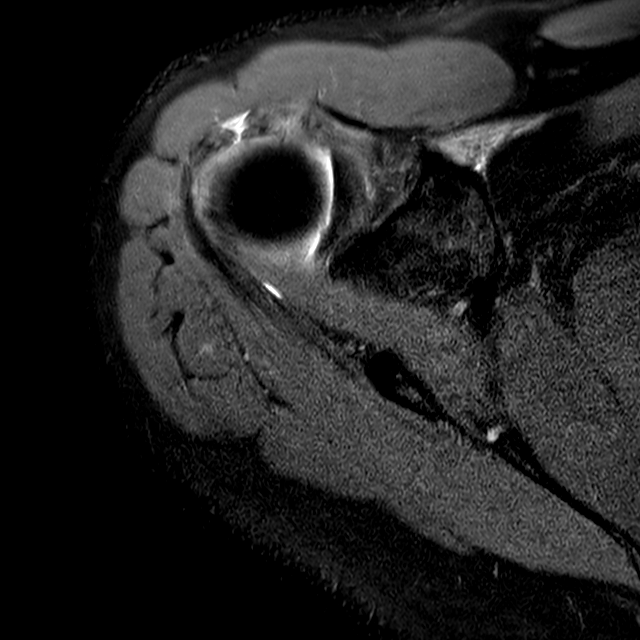
[im 30/40]
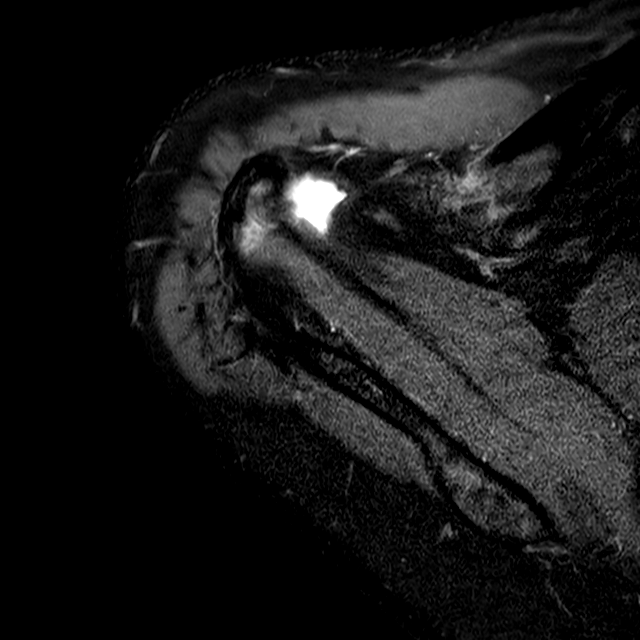
[im 35/40]
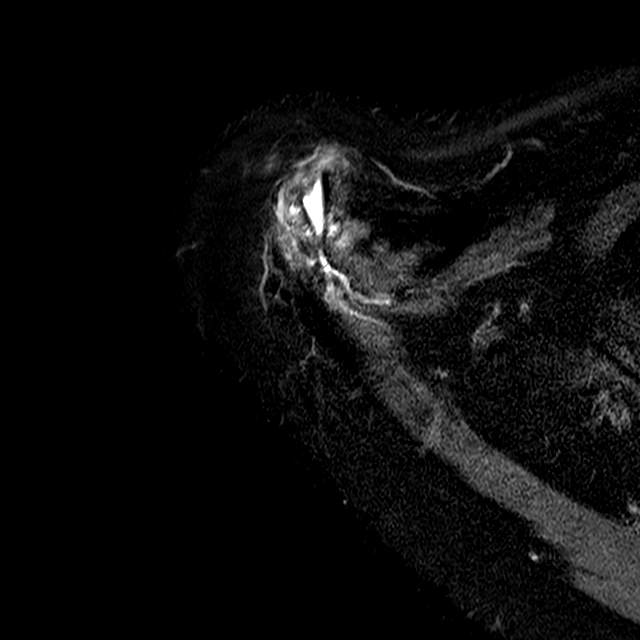

[Series 401: t2w spair sag · oblique · 3.2mm · 0.39mm/px · 3 of 30 slices shown]
[im 5/30]
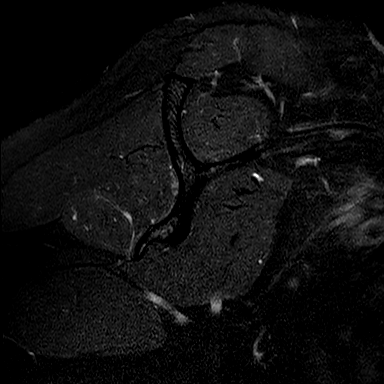
[im 15/30]
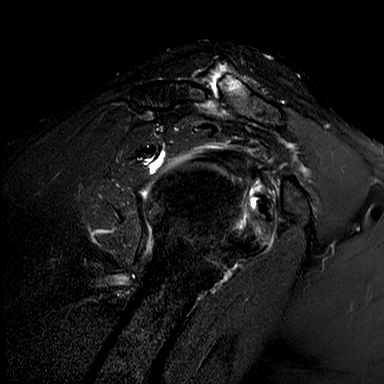
[im 25/30]
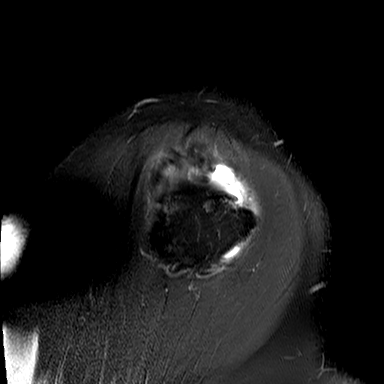

[Series 501: t2w spair cor · oblique · 2.5mm · 0.39mm/px · 3 of 27 slices shown]
[im 6/27]
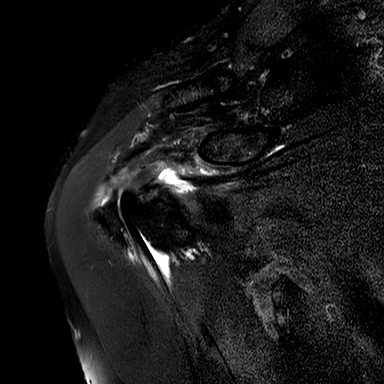
[im 16/27]
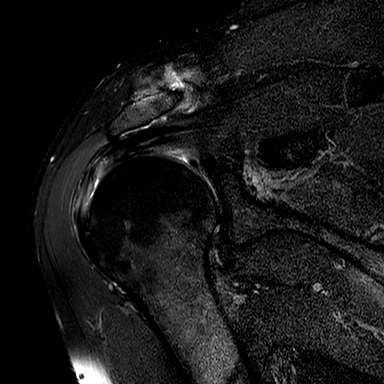
[im 27/27]
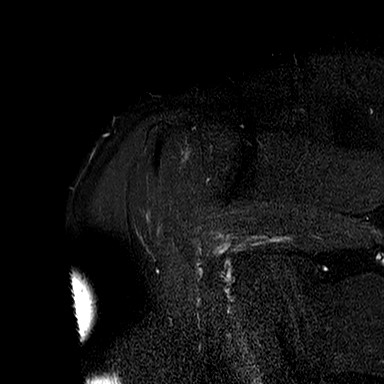

[19 of 40 positions shown; findings below may reference images not displayed]

FINDINGS: ROTATOR CUFF: Full-thickness, delaminated distal supraspinatus tendon tear 
measures 1.5 cm in AP dimensions with 1.5 cm retraction of the tendinous 
remnant. Small amount of interstitial fluid extension along the myotendinous 
junction. Supraspinatus tendinosis. Small partial-thickness articular sided 
distal infraspinatus tendon tear and tendinosis. Small partial thickness tear 
and tendinosis of the superior fibers of the distal subscapularis tendon. The 
teres minor tendon is preserved. Minimal supraspinatus and infraspinatus fatty 
muscular atrophy. 
ACROMIOCLAVICULAR JOINT: Moderate AC joint degenerative change with mild mass 
effect upon the supraspinatus. The coracoacromial ligament is intact without 
prominent spurring at the acromial attachment. The acromioclavicular and 
coracoclavicular ligaments are preserved. The acromium is normal in morphology. 
GLENOHUMERAL JOINT: The humeral head is well located within the glenoid fossa. 
Multifocal degenerative labral tears. No paralabral cyst. The intra-articular 
portion of the long head of the biceps tendon is negative. No shoulder joint 
effusion. 
BONES AND SOFT TISSUES: The bone marrow signal intensity is negative for 
fracture. No Hill-Sachs defect. Tiny humeral head subcortical cysts. Joint fluid 
extends into the subacromial/subdeltoid bursa. The axillary region is negative. 
Subcutaneous tissues are negative.
IMPRESSION: 1. 1.5 cm full-thickness, delaminated distal supraspinatus tendon tear, small 
partial thickness distal infraspinatus and subscapularis tendon tears and 
tendinosis. 
2. Minimal supraspinatus and infraspinatus fatty muscular atrophy. 
3. Moderate AC joint degenerative change with mild mass effect upon the 
supraspinatus. 
4. Multifocal degenerative labral tears.

## 2019-07-08 ENCOUNTER — Telehealth (HOSPITAL_BASED_OUTPATIENT_CLINIC_OR_DEPARTMENT_OTHER): Payer: Self-pay | Admitting: Internal Medicine

## 2019-07-08 NOTE — Telephone Encounter (Signed)
RETURN CALL: Voicemail - Detailed Message      SUBJECT:  General Message     MESSAGE: Patient is returning call regarding appt on 07/11/19. He would rather leave appt as an in person. Please contact to confirm if in person is ok. Thank you.

## 2019-07-11 ENCOUNTER — Encounter (HOSPITAL_BASED_OUTPATIENT_CLINIC_OR_DEPARTMENT_OTHER): Payer: Self-pay | Admitting: Internal Medicine

## 2019-07-11 ENCOUNTER — Ambulatory Visit (HOSPITAL_BASED_OUTPATIENT_CLINIC_OR_DEPARTMENT_OTHER): Payer: Medicare HMO | Attending: Internal Medicine | Admitting: Internal Medicine

## 2019-07-11 VITALS — BP 124/62 | HR 77 | Temp 97.2°F | Resp 16 | Ht 62.0 in | Wt 259.0 lb

## 2019-07-11 DIAGNOSIS — M1000 Idiopathic gout, unspecified site: Secondary | ICD-10-CM | POA: Insufficient documentation

## 2019-07-11 DIAGNOSIS — E7849 Other hyperlipidemia: Secondary | ICD-10-CM | POA: Insufficient documentation

## 2019-07-11 DIAGNOSIS — Z0000 Encounter for general adult medical examination without abnormal findings: Secondary | ICD-10-CM | POA: Insufficient documentation

## 2019-07-11 DIAGNOSIS — J4520 Mild intermittent asthma, uncomplicated: Secondary | ICD-10-CM | POA: Insufficient documentation

## 2019-07-11 LAB — COMPREHENSIVE METABOLIC PANEL
ALT (GPT): 22 U/L (ref 10–48)
AST (GOT): 19 U/L (ref 9–38)
Albumin: 4.4 g/dL (ref 3.5–5.2)
Alkaline Phosphatase (Total): 104 U/L (ref 36–161)
Anion Gap: 5 (ref 4–12)
Bilirubin (Total): 1 mg/dL (ref 0.2–1.3)
Calcium: 9.6 mg/dL (ref 8.9–10.2)
Carbon Dioxide, Total: 31 meq/L (ref 22–32)
Chloride: 105 meq/L (ref 98–108)
Creatinine: 1.32 mg/dL — ABNORMAL HIGH (ref 0.51–1.18)
Glucose: 94 mg/dL (ref 62–125)
Potassium: 4.2 meq/L (ref 3.6–5.2)
Protein (Total): 7.4 g/dL (ref 6.0–8.2)
Sodium: 141 meq/L (ref 135–145)
Urea Nitrogen: 14 mg/dL (ref 8–21)
eGFR by CKD-EPI: 55 mL/min/{1.73_m2} — ABNORMAL LOW (ref >59)

## 2019-07-11 LAB — CBC (HEMOGRAM)
Hematocrit: 48 % (ref 38–50)
Hemoglobin: 16.2 g/dL (ref 13.0–18.0)
MCH: 30 pg (ref 27.3–33.6)
MCHC: 33.6 g/dL (ref 32.2–36.5)
MCV: 89 fL (ref 81–98)
Platelet Count: 238 10*3/uL (ref 150–400)
RBC: 5.4 10*6/uL (ref 4.40–5.60)
RDW-CV: 13 % (ref 11.6–14.4)
WBC: 5.67 10*3/uL (ref 4.30–10.00)

## 2019-07-11 LAB — LIPID PANEL
Cholesterol (LDL): 89 mg/dL (ref <130)
Cholesterol/HDL Ratio: 3.3
HDL Cholesterol: 44 mg/dL (ref >39)
Non-HDL Cholesterol: 102 mg/dL (ref 0–159)
Total Cholesterol: 146 mg/dL (ref <200)
Triglyceride: 67 mg/dL (ref <150)

## 2019-07-11 LAB — URIC ACID, SERUM: Uric Acid: 7.6 mg/dL (ref 3.9–7.6)

## 2019-07-11 NOTE — Patient Instructions (Addendum)
Today we had an initial visit for a new PCP.  We reviewed your history of asthma and gout, we reviewed your medications, and health care maintenance.     I will review your medications and refill those in need, including tylenol, inhalers and possibly others.      For your asthma I will refer you to the breathing lab for pulmonary function tests.     We will plan to obtain blood tests today for chemistries, uric acid, blood sugar, cholesterol and blood count.  We will also provide a FIT card for stool testing.     We discussed prostate cancer screening and you wished to wait for a future visit to consider testing.      You indicated that you would like to have an eye doctor visit, this referral is made.     At a future visit we will work to remove skin tags using the cryotherapy.     Please call Dr. Franchot Erichsen if you have any questions.  [phone]

## 2019-07-11 NOTE — Progress Notes (Signed)
[facility] - Outpatient Return Clinic Note    Date:  07/11/2019    Nathan [name] is a 68 year old male who is here today for   Chief Complaint   Patient presents with   . Asthma     INTERVAL HISTORY/HPI:    Nathan [name] is a 67 year old English speaking man who likes to go salmon fishingw/history of asthma, mild CKD, gouthere to establish care.     Left upper leg and knee pain about 2 weeks.     Gout - normally in his feet    Allergies:  To grass and cherry blossoms. He reports they have been kicking in late November. Not allergic to ASA      No COVID symptoms, occ sniffles.      Asthma:  He reports onset of asthma about age 52. Quite tobacco age 67.  Never hospitalized for Asthma or called 911.   He can hear wheezing at times, but not now.  He had a spacer for MDI but felt it did not work well for him.       CKD - he has some insufficiency.     History of surgeon achilles tendon. Left LE.     SH:  He has been staying at home.  He lives at home alone, likes to fish in the warmer months, believes in [place], and lives on a fixed income.  He likes to "speak when spoken to".   When fishing on the [employer] he fell a few times, pinks and silvers.      [facility] came for a visit to check on him.  Skin tags are showing tags showing up under the arms, neck and back.    PROBLEM LIST: See Epic Problem List    MEDICATIONS:    Current Outpatient Medications:   .  Albuterol Sulfate HFA 108 (90 Base) MCG/ACT Inhalation Aero Soln, Inhale 2 puffs by mouth every 6 hours as needed for shortness of breath/wheezing., Disp: 1 Inhaler, Rfl: 6  .  allopurinol 100 MG tablet, Take 2 tablets (200 mg) by mouth daily., Disp: 60 tablet, Rfl: 3  .  atorvastatin 40 MG tablet, Take 1 tablet (40 mg) by mouth every evening. To lower cholesterol., Disp: 90 tablet, Rfl: 3  .  colchicine 0.6 MG tablet, Take 2 tablets by mouth at first sign of flare followed by 1 table 1 hour later, Disp: 30 tablet, Rfl: 0  .  Fluticasone  Propionate HFA 110 MCG/ACT Inhalation Aerosol, Inhale 1 puff by mouth every 12 hours., Disp: 1 Inhaler, Rfl: 6  .  ketoconazole 2 % shampoo, apply ON SCALP, LATHER AND RINSE OFF DAILY, Disp: 120 mL, Rfl: 0  .  Triamcinolone Acetonide 0.1 % External Ointment, Apply to affected area on neck 2 times a day., Disp: 15 g, Rfl: 0    Med Rec (07/11/19)    Allopurinol 100 mg 2 Tabs daily  Colchicine 0.6 mg daily PRN - this has been stopped.   Fluticasone 110 MDI  Albuterol 108 2 puffs q6 PRN  Ketoconazole 2% shampoo   Triamcinolone 0.1% cream  Atorvastatin 40 mg q hs    ASA - stopped. Will evaluate.    ALLERGIES:  Review of patient's allergies indicates:  No Known Allergies    PHYSICAL EXAM:    BP 124/62   Pulse 77   Temp 97.2 F (36.2 C) (Temporal)   Resp 16   Ht 5\' 2"  (1.575 m)  Wt (!) 259 lb (117.5 kg)   SpO2 100% Comment: at room air, resting  BMI 47.37 kg/m     PEF x 2 @ 300     NAD  Lungs clear to auscultation, no wheezing.    COR RRR w/o murmur  Ext 2+ LE edema  DP pulses strong and symmetric  Sensation intact to 10 gm monofilament  Skin: multiple small skin tags upper back and neck, axilla.      LAB AND IMAGING RESULTS:  Orders Only on 07/23/18   1. Occult Blood By IA, Stool   Result Value Ref Range    Occult Bld 1 Result Negative NRN       ASSESSMENT AND PLAN:    1.  Asthma.  He uses -Fluticasone 110 mcg BID  -Albuterol Q6hr PRNand has a stable pattern.  He reports that he has never called 911 or been hospitalized for Asthma.  No documentation of PFTs at [facility] or [facility].  PEF on exam today 300 lpm, no wheezing on exam.  Will refill meds and refer for PFTs.      2. Chronic gout.  He report intermittent foot pain but is non-specific on interview.  Records indicate last flare podagra (L great toe) in June 2017.  He is minimizing red meat, and no alcohol.  He has CKD therefore goal is to use tylenol over NSAIDs for pain.  He is on Allopurinol 200 mg daily, Colchicine 0.6 mg 2 tabs for flare, then 1  tab 1 hr later.  Uric Acid 7.4 (07/07/18).  CrCl at 50 mL/min, allopurinol and colchicine doses acceptable.  ASA has been stopped due to gout and CKD.      3. Cardiac Risk Factors.  BP 124/62 off meds, BMI 47, LDL 96, DM no, No FH CAD, No tobacco.   ASCVD Risk 9.8% Atorvastatin 40 MG. ASA has been stopped.  Repeat lipids and reassess ASCVD risk and role of ASA.      Cholesterol Latest Ref Rng & Units 11/07/2015   Total Cholesterol <200 mg/dL 161   HDL Cholesterol >39 mg/dL 45   LDL Cholesterol <130 mg/dL 96   Triglycerides <150 mg/dL 101   Total/HDL Ratio  3.6   Non-HDL Chol 0 - 159 mg/dL 116       4. CKD.  Stable pattern over time.  Repeat labs.  Prot / Creat < 0.1 (02/22/16).  CrCl about 50 ml/min.  Will plan to reevaluate.      CREATININE [facility] AMB Latest Ref Rng & Units 11/07/2015 02/22/2016   Creatinine 0.51 - 1.18 mg/dL 1.24 (H) 1.32 (H)     CREATININE [facility] AMB Latest Ref Rng & Units 11/27/2017 03/03/2018   Creatinine 0.51 - 1.18 mg/dL 1.41 (H) 1.34 (H)     5. Tinea Capitus.  Ketoconazole shampoo      HEALTH CARE MAINTANENCE / SCREENING:  (See below)    PHQ 2 Screening: p    Colorectal Cancer Screening:        Tobacco Use:  ETOH Use:    Marijuana:      CT SCAN / Low Dose Lung CT (Age 59-80, >30 pyrs and quit < 15 yrs ago):    Hepatitis C / HIV Screening:     Hepatitis C  AFP:  RUQ U/S:      Osteoporosis Screening: (RF: Age, prior Fx, Fam Hx, glucocorticoid, low body weight, Tob, ETOH, RA, Secondary Osteoporosis (hypogonad, menopause, malabs, Liver Dz, IBD). Screening: Women > 61, Women <  61, if post-menopausal with RF's, Men with low impact Fx or RF's.)  FRAX Score:      MEN:    Prostate Cancer Screening:     AAA Screening (Men 51-75, Any Tob Hx, one time):       Health Care Maintenance:    Fit test today. He declines PSA   - Occult Blood By IA, Stool; Future      Cardiovascular:   AAA (65-71M who smoked):  ASA (38-59 yo w/ >10% CVD risk in 10 yr):   Lipids (all >35, younger if high risk):Last 10/2015: TC 161, TG 101,  LDL 96, HDL 45. On Atorva 40mg  Qdaily  HbA1c(40-70 mf who are obese):Last A1C 6.3 02/2018  Cancer Screening  Family Hx:   Colon (age 53-75):Last FIT10/2018neg. Given FIT card 06/2018 No family of hx colon cancer  Mammogram (50-26F biannual):NA  PAP (21-29 q3y, 30-65 q5y w/ HPV):NA  Lung (55-80yo if >30py and quit w/in 15 yrs):NA  PSA:No prostate cancer in family. Declines PSA after discussion of risks and benefits 06/2018   ID  [id number] neg 2013  GC/Chlamydia (F<24 yo and high risk older):   Hepatitis serologies:   Hep C (1945-65 + high risk):Neg 2013  Hep B ( (high risk):     Immunizations  Influenza:Declined 04/2017  Tdap:Last 01/2011  Pneumovax (PCV13 age age >37, and a year later PPSV23, 8 weeks later for immunocompromised adults. PCV13 a year after PPSV23 if no PCV13 before) :Had pneumovax 23 01/2009.   Zoster (age >60):Patient declined10/2018  Bone  Vit D:   Dexa scan (f>65, or high risk):      Immunization History   Administered Date(s) Administered   . Hep B, unspecified 10/05/2007, 11/05/2007, 05/08/2008   . Influenza quadrivalent PF 04/19/2013, 06/12/2014, 05/01/2015   . Influenza quadrivalent adjuvanted (Fluad) 05/19/2019   . Influenza trivalent PF 07/07/2012   . Influenza, unspecified 09/26/2010   . Tdap vaccine 01/21/2011   . pneumococcal (Pneumovax 23) polysaccharide vaccine 02/12/2009     Future Appointments   Date Time Provider [place]   11/21/2019  1:00 PM [name], MD [facility] AM       Total time spent was over 45 minutes.  Greater than 50% of this visit was spent face to face in counseling, coordination of care,  discussing treatment options, and  education regarding the multiple problems as described in the note above.

## 2019-07-12 LAB — HEMOGLOBIN A1C, HPLC: Hemoglobin A1C: 6.5 % — ABNORMAL HIGH (ref 4.0–6.0)

## 2019-07-25 ENCOUNTER — Other Ambulatory Visit (HOSPITAL_BASED_OUTPATIENT_CLINIC_OR_DEPARTMENT_OTHER): Payer: Self-pay | Admitting: Internal Medicine

## 2019-07-25 DIAGNOSIS — Z0000 Encounter for general adult medical examination without abnormal findings: Secondary | ICD-10-CM

## 2019-07-26 ENCOUNTER — Other Ambulatory Visit (HOSPITAL_BASED_OUTPATIENT_CLINIC_OR_DEPARTMENT_OTHER)
Admit: 2019-07-26 | Discharge: 2019-07-26 | Disposition: A | Discharge status: Home / Self Care | Payer: Medicare HMO | Attending: Internal Medicine | Admitting: Internal Medicine

## 2019-07-26 DIAGNOSIS — Z0000 Encounter for general adult medical examination without abnormal findings: Secondary | ICD-10-CM | POA: Insufficient documentation

## 2019-07-26 LAB — OCCULT BLOOD BY IA, STL: Occult Bld 1 Result: NEGATIVE

## 2019-08-03 ENCOUNTER — Encounter (HOSPITAL_BASED_OUTPATIENT_CLINIC_OR_DEPARTMENT_OTHER): Payer: Medicare HMO

## 2019-08-05 ENCOUNTER — Ambulatory Visit (HOSPITAL_BASED_OUTPATIENT_CLINIC_OR_DEPARTMENT_OTHER): Payer: Medicare HMO

## 2019-08-10 ENCOUNTER — Other Ambulatory Visit (HOSPITAL_BASED_OUTPATIENT_CLINIC_OR_DEPARTMENT_OTHER): Payer: Self-pay

## 2019-08-10 DIAGNOSIS — M1A3790 Chronic gout due to renal impairment, unspecified ankle and foot, without tophus (tophi): Secondary | ICD-10-CM

## 2019-08-11 MED ORDER — ALLOPURINOL 100 MG OR TABS
200.0000 mg | ORAL_TABLET | Freq: Every day | ORAL | 5 refills | Status: DC
Start: 2019-08-11 — End: 2020-02-09

## 2019-08-11 NOTE — Telephone Encounter (Signed)
Pt has established with a new PCP , Dr. Franchot Erichsen in Dec.,   Meds are up to date  Sent refill to pharmacy

## 2019-08-11 NOTE — Telephone Encounter (Signed)
Patient called stating that his medication is not being refilled per Pharmacy d/t need authorization from new PCP.  Previous Dr. Ronny Bacon is no longer with [facility].(see note below).  RN advised note would be forwarded to Dr. Franchot Erichsen, which patient seen 07/11/19.  Patient verbalized understanding.        August 11, 2019  Shanon Brow, Pharmacy Technician  to [facility] . Shanon Brow, Pharmacy Technician         2:36 PM  We are contacting you because the provider, Dr. Ronny Bacon, is no longer at the clinic and patient has not established care with a new PCP. This falls outside of the Refill Authorization Center's protocols. Please have you or your staff inform the patient and schedule an appointment if necessary.

## 2019-08-22 ENCOUNTER — Other Ambulatory Visit (HOSPITAL_BASED_OUTPATIENT_CLINIC_OR_DEPARTMENT_OTHER): Payer: Self-pay | Admitting: Internal Medicine

## 2019-08-22 ENCOUNTER — Telehealth (HOSPITAL_BASED_OUTPATIENT_CLINIC_OR_DEPARTMENT_OTHER): Payer: Self-pay

## 2019-08-22 DIAGNOSIS — E785 Hyperlipidemia, unspecified: Secondary | ICD-10-CM

## 2019-08-22 DIAGNOSIS — R7303 Prediabetes: Secondary | ICD-10-CM

## 2019-08-22 MED ORDER — ATORVASTATIN CALCIUM 40 MG OR TABS
ORAL_TABLET | ORAL | 3 refills | Status: DC
Start: 2019-08-22 — End: 2020-09-11

## 2019-08-22 NOTE — Progress Notes (Signed)
FIT negative (07/23/18 and 07/26/19)

## 2019-08-22 NOTE — Telephone Encounter (Signed)
Pt called requesting refill of loratadine 10mg  for allergy, last filled in 2018. Pt stated he called [facility] and he was advised to call the clinic.   Advised pt I will forward his message to Dr. Franchot Erichsen. Pt understood and stated he will pick up medicine at [facility] on file, [place] and [place].

## 2019-08-27 ENCOUNTER — Telehealth (HOSPITAL_BASED_OUTPATIENT_CLINIC_OR_DEPARTMENT_OTHER): Payer: Self-pay | Admitting: Internal Medicine

## 2019-08-27 DIAGNOSIS — J3089 Other allergic rhinitis: Secondary | ICD-10-CM

## 2019-08-27 MED ORDER — LORATADINE 10 MG OR TABS
10.0000 mg | ORAL_TABLET | Freq: Every day | ORAL | 2 refills | Status: DC | PRN
Start: 2019-08-27 — End: 2020-09-11

## 2019-08-27 NOTE — Telephone Encounter (Signed)
Loratadine refill.

## 2019-10-24 ENCOUNTER — Telehealth (HOSPITAL_BASED_OUTPATIENT_CLINIC_OR_DEPARTMENT_OTHER): Payer: Self-pay | Admitting: Internal Medicine

## 2019-10-24 NOTE — Telephone Encounter (Signed)
Called pt to discuss refill request for acetaminophen. He states someone prescribed it for him- Dr. Franchot Erichsen did not order. He reports it helps with gout pain.  Pt reports he has had joint pain for 2 months, starting with his thumb, then index finger now his wrist.    He only has #15 acetaminophen tablets left, anxious to be seen to "see if it is gout"    Appointment made with Medical student team 9:45 am 10/25/19

## 2019-10-24 NOTE — Telephone Encounter (Signed)
RETURN CALL: Voicemail - General Message      SUBJECT:  Refill Request    NAME OF MEDICATION(S): acetaminophen, pain relief 25mg  100 tablets  DATE NEEDED BY: 7 days left  PRESCRIBING PROVIDER: goss  PHARMACY NAME/LOCATION: [address] [phone] [address] [place] [phone]      ADDITIONAL INFORMATION: na

## 2019-10-25 ENCOUNTER — Ambulatory Visit: Payer: Medicare HMO | Attending: Internal Medicine | Admitting: Internal Medicine

## 2019-10-25 VITALS — BP 138/82 | HR 71 | Temp 97.7°F | Resp 16 | Ht 62.0 in | Wt 267.0 lb

## 2019-10-25 DIAGNOSIS — M79641 Pain in right hand: Secondary | ICD-10-CM | POA: Insufficient documentation

## 2019-10-25 DIAGNOSIS — M654 Radial styloid tenosynovitis [de Quervain]: Secondary | ICD-10-CM | POA: Insufficient documentation

## 2019-10-25 MED ORDER — ACETAMINOPHEN 325 MG OR TABS
325.0000 mg | ORAL_TABLET | Freq: Four times a day (QID) | ORAL | 1 refills | Status: DC | PRN
Start: 2019-10-25 — End: 2020-11-01

## 2019-10-25 NOTE — Progress Notes (Signed)
ID/CC: [name] is a 69 year old male with a history of gout, mild CKD, and hyperlipidemia who presents to clinic with complaint of 2 mo R thumb/wrist pain.      HPI  1. R Thumb/Wrist pain  [name] states that he was in his usual state of health approx 2 months ago when he noticed sudden onset pain in the dorsal aspect of his thumb that radiated to his wrist and index finger. When the pain started he was using a computer mouse, and endorses potentially having put increased weight on his arm/hand before it began. For the first week he rated the pain as an 8/10 and described it as achey and "headache like". He initially thought this was another gout attack and took his prescribed colchicine 0.6 mg twice, with little to no effect. He endorses mild  During this period he denies episodes of chills, nausea, vomiting, diarrhea, hand rash or lesion, chest pain, palpitations, dyspnea, and trauma/falls. Over the course of the 2 months he tried several means of addressing the pain including thumb taping, a compression glove, and applying warm/cold compresses, all with minimal alleviating effect. He began taking 325 mg acetaminophen twice daily, which he reported to provide a significant reduction in his pain, down to a 1/10. He notes that the pain has gradually gotten better, so much so that he discontinued the acetaminophen ~2 weeks ago and rates his pain as a 2-3/10 in clinic today, although still reports 4.5/10 pain most evenings. His most recent gout attack was 2.5 years ago in his left big toe and he denies frequent consumption of red meat or any EtOH use. He endorses good compliance with his gout medications and denies any difficulties in obtaining his prescriptions. Patient also reported occasional stiffness in both his L and R middle finger and previously his L thumb, but denies any episodes of pain/discomfort. He enjoys salmon fishing and hopes to have this bothersome pain under control for the start of the season.        Problem List  1. Right dorsal thumb and wrist pain, onset 2 mo  2. Gout, on allopurinol qday and colchicine prn, last flair 2.5 years ago and uric acid 7.6 on 07/11/19  3. CKD, creatinine 1.32 BUN 14 eGFR 55 on 07/11/19  4. Hyperlipidemia, LDL 89 HDL 44 TG 67 on 07/11/19  5. Asthma, controlled with fluticasone and albuterol prn   6. Seasonal allergies, controlled with loratadine prn  7. Prediabetes, HbA1c 6.5 on 07/11/19  8. Obesity, BMI 48.8 on 10/25/19     Medications  Allopurinol 100 mg po bid   Colchicine 0.6 mg po PRN   Atorvastatin 40 mg po qday   Fluticasone 1 puff aerosol bid   Albuterol sulfate 2 puff aerosol PRN   Loratadine 10 mg po PRN     ROS  Renal: Denies urinary urgency, dysuria, and hematuria   GI: Denies changes in stool caliber/consistency, diarrhea, and constipation.      Physical Exam   VITALS: BP 139/82 HR 71 RR 16 Temp 36.5  CONSITUTIONAL: Patient sitting comfortably in exam room in no acute distress.   CARDIAC: Regular rate and rhythm. No S3 or S4. No JVD or carotid bruits. Radial pulses 2+ bilaterally. No upper or lower extremity edema.  PULM: Lungs clear to auscultation bilaterally without wheezes, rales, and rhonchi.   MSK: Mild swelling without erythema of the radial aspect of the R thumb. Mild tenderness to palpation of area surrounding R thenar joint  and metacarpal of thumb. Positive Finkelstein test on the R. Moderate tenderness with extension of R thumb. Finger grip, abduction, wrist extension, and wrist flexion 5/5 strength bilaterally.     Assessment and Plan  1. 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 The mild warmth/swelling/tenderness on palpation of the affected area without other findings would further support an inflammatory etiology. Given the timeline and persistence of pain onset coupled with the  localization and unresponsiveness to previously effective uric acid reducing medication, there is a low likelihood that this is a gout flair. He also has had no changes in diet or EtOH intake that would raise suspicion for a flair. Localization near the anatomic snuff box raises some concern for a scaphoid fracture, although no hx of trauma/falls and the relatively mild/diffuse nature of the pain makes this dx similarly less likely. Carpal tunnel syndrome could possible manifest with a more radial dominant presentation, but the distrubution makes this diagnosis much less likely. It's entirely possible the stiffness in his R and L middle fingers are the beginnings of osteoarthritis, but overall the acuity does not appear to warrant further investigation at this time.     Plan:   -Refilled rx for acetominophen 325 mg so patient can continue to take PRN   -Instructed patient to purchase Spica splint and begin wearing every evening as tolerated   -Plan to assess pain status with Dr Nathan [name] during 11/21/2019 appt   -If pain persists, consider referral to PT and/or glucocorticoid injection as both are established next steps after failure of NSAIDs/splinting for this condition   -Continue to monitor and document suspected OA in middle fingers for signs of disease progression  -Continue on current medication regimen as prescribed

## 2019-10-25 NOTE — Patient Instructions (Signed)
Today we talked about your wrist pain. Plan is to continue to take the acetaminophen 325 mg as needed to manage the pain. Icing may help for reducing pain/swelling. Purchase a Spica splint from [employer] and wear every evening. Contact our clinic if the pain is still presents in 6 weeks, or gets worse.

## 2019-10-25 NOTE — Progress Notes (Signed)
Dictation #1  [id number]  [id number]   [id number]

## 2019-10-26 NOTE — Progress Notes (Signed)
REASON FOR VISIT:  For refills of Tylenol and right hand pain.    SUBJECTIVE:  Nathan [name] is a very pleasant 69 year old gentleman with a history of gout, CKD and asthma.  He is here today for refills of his Tylenol and he was due to be seen today for his right hand pain.  This started approximately 2 months ago, located on the radial aspect of his wrist affecting his thumb.  He states that there is some pain along the middle finger, particularly around the MCP joint, as well.  The pain seems to be fairly well controlled with Tylenol.  He denies any trauma or injury;  however, says that this may have started while he was using his computer.  He demonstrates that he was holding mouse in his right hand and had noticed first onset of the pain at that time.  He otherwise seems to not be functionally too limited by this pain, especially with taking the Tylenol.    MEDICATIONS:  Reviewed in Epic.    OBJECTIVE:  VITAL SIGNS:  Temperature is 36.5, BP 138/82, pulse 71, respiratory rate 16, weight is 267 pounds.  GENERAL:  He appears well, in no acute pain or distress.  HEENT:  Sclerae were anicteric.  NECK:  Supple.  CHEST:  Normal respiratory effort.  MUSCULOSKELETAL:  Focused examination of his right hand revealed some very mild soft tissue swelling along the dorsal aspect along where the MCPs are.  He has some mild tenderness at the MCP joint of the middle finger.  Good range of motion.  He is able to make a strong full grip.  He has some tenderness also along the extensor tendon of the right thumb and has pain with resisted extension.  Finkelstein's test was mildly positive.  He has full range of motion of his wrist and elbow.    ASSESSMENT AND PLAN:  Right-sided hand pain x2 months.  He has pain over what seems like two different areas of the hand; one that includes the extensor tendon of his thumb, as well as the MCP joint of his middle finger.  He seems satisfied to continue taking Tylenol, which seems to relieve  the pain for him quite well.  We have also suggested that he may go to his retail pharmacy and buy a spica splint for the thumb.  If he has worsening of his pain or persistence of it, we have asked him to return earlier and we can consider a referral to hand therapy.

## 2019-11-20 NOTE — Progress Notes (Deleted)
[facility]ult Medicine Clinic - Outpatient Return Clinic Note    Date:  11/21/2019    INTERVAL HISTORY/HPI:    [name] is a 69 year old [place] who likes to go salmon fishingw/history of asthma, mild CKD, gouthere to establish care.     Left upper leg and knee pain about 2 weeks.     Gout - normally in his feet    Allergies:  To grass and cherry blossoms. He reports they have been kicking in late November. Not allergic to ASA        SH:  He has been staying at home.  He lives at home alone, likes to fish in the warmer months, believes in [place], and lives on a fixed income.  He likes to "speak when spoken to".   When fishing on the [employer] he fell a few times, pinks and silvers.      [facility] came for a visit to check on him.  Skin tags are showing tags showing up under the arms, neck and back.      PROBLEM LIST: See Epic Problem List    MEDICATIONS:    Current Outpatient Medications:     acetaminophen 325 MG tablet, Take 1 tablet (325 mg) by mouth every 6 hours as needed for pain., Disp: 50 tablet, Rfl: 1    Albuterol Sulfate HFA 108 (90 Base) MCG/ACT Inhalation Aero Soln, Inhale 2 puffs by mouth every 6 hours as needed for shortness of breath/wheezing., Disp: 1 Inhaler, Rfl: 6    allopurinol 100 MG tablet, Take 2 tablets (200 mg) by mouth daily., Disp: 60 tablet, Rfl: 5    atorvastatin 40 MG tablet, Take 1 tablet (40 mg) by mouth every evening. To lower cholesterol., Disp: 90 tablet, Rfl: 3    colchicine 0.6 MG tablet, Take 2 tablets by mouth at first sign of flare followed by 1 table 1 hour later, Disp: 30 tablet, Rfl: 0    Fluticasone Propionate HFA 110 MCG/ACT Inhalation Aerosol, Inhale 1 puff by mouth every 12 hours., Disp: 1 Inhaler, Rfl: 6    ketoconazole 2 % shampoo, apply ON SCALP, LATHER AND RINSE OFF DAILY, Disp: 120 mL, Rfl: 0    loratadine 10 MG tablet, Take 1 tablet (10 mg) by mouth daily as needed for allergies., Disp: 30 tablet, Rfl: 2    Triamcinolone Acetonide  0.1 % External Ointment, Apply to affected area on neck 2 times a day., Disp: 15 g, Rfl: 0    Med Rec (07/11/19)    Allopurinol 100 mg 2 Tabs daily  Colchicine 0.6 mg daily PRN - this has been stopped.   Fluticasone 110 MDI  Albuterol 108 2 puffs q6 PRN  Ketoconazole 2% shampoo   Triamcinolone 0.1% cream  Atorvastatin 40 mg q hs    ASA - stopped. Will evaluate.    ALLERGIES:  Review of patient's allergies indicates:  No Known Allergies    PHYSICAL EXAM:    ***    LAB AND IMAGING RESULTS:  Orders Only on 07/25/19   1. Occult Blood By IA, Stool   Result Value Ref Range    Occult Bld 1 Result Negative NRN         Asthma:  He reports onset of asthma about age 22. Quite tobacco age 44.  Never hospitalized for Asthma or called 911.   He can hear wheezing at times, but not now.  He had a spacer for MDI but felt it did not work  well for him.       CKD - he has some insufficiency.     History of surgeon achilles tendon. Left LE.       ASSESSMENT AND PLAN:    1.  Asthma.  He uses -Fluticasone 110 mcg BID  -Albuterol Q6hr PRNand has a stable pattern.  He reports that he has never called 911 or been hospitalized for Asthma.  No documentation of PFTs at [facility] or [facility].  PEF on exam today 300 lpm, no wheezing on exam.  Will refill meds and refer for PFTs.      2. Chronic gout.  He report intermittent foot pain but is non-specific on interview.  Records indicate last flare podagra (L great toe) in June 2017.  He is minimizing red meat, and no alcohol.  He has CKD therefore goal is to use tylenol over NSAIDs for pain.  He is on Allopurinol 200 mg daily, Colchicine 0.6 mg 2 tabs for flare, then 1 tab 1 hr later.  Uric Acid 7.4 (07/07/18).  CrCl at 50 mL/min, allopurinol and colchicine doses acceptable.  ASA has been stopped due to gout and CKD.      3. Cardiac Risk Factors.  BP 124/62 off meds, BMI 47, LDL 96, DM no, No FH CAD, No tobacco.   ASCVD Risk 9.8% Atorvastatin 40 MG. ASA has been stopped.  Repeat lipids  and reassess ASCVD risk and role of ASA.      Cholesterol Latest Ref Rng & Units 11/07/2015   Total Cholesterol <200 mg/dL 161   HDL Cholesterol >39 mg/dL 45   LDL Cholesterol <130 mg/dL 96   Triglycerides <150 mg/dL 101   Total/HDL Ratio  3.6   Non-HDL Chol 0 - 159 mg/dL 116       4. CKD.  Stable pattern over time.  Repeat labs.  Prot / Creat < 0.1 (02/22/16).  CrCl about 50 ml/min.  Will plan to reevaluate.      CREATININE [facility] AMB Latest Ref Rng & Units 11/07/2015 02/22/2016   Creatinine 0.51 - 1.18 mg/dL 1.24 (H) 1.32 (H)     CREATININE [facility] AMB Latest Ref Rng & Units 11/27/2017 03/03/2018   Creatinine 0.51 - 1.18 mg/dL 1.41 (H) 1.34 (H)     5. Tinea Capitus.  Ketoconazole shampoo      HEALTH CARE MAINTANENCE / SCREENING:      Cardiovascular:   AAA (65-81M who smoked):  ASA (10-59 yo w/ >10% CVD risk in 10 yr):   Lipids (all >35, younger if high risk):Last 10/2015: TC 161, TG 101, LDL 96, HDL 45. On Atorva 40mg  Qdaily  HbA1c(40-70 mf who are obese):Last A1C 6.3 02/2018  PHQ 2 Screening: p    Colorectal Cancer Screening: FIT negative (07/23/18 and 07/26/19)    Tobacco Use:  ETOH Use:    Marijuana:      CT SCAN / Low Dose Lung CT (Age 58-80, >30 pyrs and quit < 15 yrs ago):    Hepatitis C / HIV Screening: [id number] neg 2013   Hepatitis serologies:   Hep C (1945-65 + high risk):Neg 2013  Hep B ( (high risk):       Osteoporosis Screening: (RF: Age, prior Fx, Fam Hx, glucocorticoid, low body weight, Tob, ETOH, RA, Secondary Osteoporosis (hypogonad, menopause, malabs, Liver Dz, IBD). Screening: Women > 22, Women < 55, if post-menopausal with RFs, Men with low impact Fx or RFs.)  FRAX Score:      MEN:  Prostate Cancer Screening:     AAA Screening (Men 73-75, Any Tob Hx, one time):     Family Hx:   Colon (age 56-75):Last FIT10/2018neg.Given FIT card 12/2019No family of hx colon cancer  Mammogram (50-27F biannual):NA  PAP (21-29 q3y, 30-65 q5y w/ HPV):NA  Lung (55-80yo if >30py and quit w/in 15  yrs):NA  PSA:No prostate cancer in family.Declines PSA after discussion of risks and benefits 06/2018    Influenza:Declined 04/2017  Tdap:Last 01/2011  Pneumovax (PCV13 age age >36, and a year later PPSV23, 8 weeks later for immunocompromised adults. PCV13 a year after PPSV23 if no PCV13 before) :Had pneumovax 23 01/2009.  Zoster (age >60):Patient declined10/2018    Immunization History   Administered Date(s) Administered    COVID-19 Moderna mRNA LNP-S PF 09/19/2019, 10/18/2019    Hepatitis B, unspecified 10/05/2007, 11/05/2007, 05/08/2008    Influenza quadrivalent PF 04/19/2013, 06/12/2014, 05/01/2015    Influenza quadrivalent adjuvanted (Fluad) 05/19/2019    Influenza trivalent PF 07/07/2012    Influenza, unspecified 09/26/2010    Pneumococcal polysaccharide PPSV23 (Pneumovax 23) 02/12/2009    Tdap 01/21/2011

## 2019-11-21 ENCOUNTER — Encounter (HOSPITAL_BASED_OUTPATIENT_CLINIC_OR_DEPARTMENT_OTHER): Payer: Self-pay | Admitting: Internal Medicine

## 2019-12-26 ENCOUNTER — Ambulatory Visit: Payer: Medicare HMO | Attending: Internal Medicine

## 2019-12-26 ENCOUNTER — Other Ambulatory Visit (HOSPITAL_BASED_OUTPATIENT_CLINIC_OR_DEPARTMENT_OTHER): Payer: Self-pay

## 2019-12-26 VITALS — BP 147/70 | HR 68 | Temp 97.5°F | Resp 16 | Wt 266.0 lb

## 2019-12-26 DIAGNOSIS — B350 Tinea barbae and tinea capitis: Secondary | ICD-10-CM | POA: Insufficient documentation

## 2019-12-26 DIAGNOSIS — Z0000 Encounter for general adult medical examination without abnormal findings: Secondary | ICD-10-CM | POA: Insufficient documentation

## 2019-12-26 DIAGNOSIS — R7303 Prediabetes: Secondary | ICD-10-CM | POA: Insufficient documentation

## 2019-12-26 LAB — BASIC METABOLIC PANEL
Anion Gap: 4 (ref 4–12)
Calcium: 9.4 mg/dL (ref 8.9–10.2)
Carbon Dioxide, Total: 29 meq/L (ref 22–32)
Chloride: 105 meq/L (ref 98–108)
Creatinine: 1.23 mg/dL — ABNORMAL HIGH (ref 0.51–1.18)
Glucose: 117 mg/dL (ref 62–125)
Potassium: 4 meq/L (ref 3.6–5.2)
Sodium: 138 meq/L (ref 135–145)
Urea Nitrogen: 13 mg/dL (ref 8–21)
eGFR by CKD-EPI: >60 mL/min/{1.73_m2} (ref >59)

## 2019-12-26 MED ORDER — KETOCONAZOLE 2 % EX SHAM
MEDICATED_SHAMPOO | CUTANEOUS | 3 refills | Status: DC
Start: 2019-12-26 — End: 2021-06-07

## 2019-12-26 NOTE — Progress Notes (Signed)
Adult Medicine Clinic- Return Visit    Date: 12/26/2019      ID/CC:  Nathan [name] is a 69 year old male with a history of gout, mild CKD, and hyperlipidemia who presents to clinic for follow up.     Subjective/Interval History:  Patient's PCP is Dr. Adelene Idler.    #R thumb/wrist pain  Patient was last [facility] at [facility] with concern of  2 mo of R thumb/wrist pain, concerning for de Quervian tendinopathy.   - Pain is improved wearing splint. Denies needing any pain medication     #Colon cancer screening  -FIT negative 07/26/2019    #Skin tags   -Present on neck, back. Not currently bothering him    #Asthma  - Using albuterol inhaler ~ 2x/day  - Also using fluticasone inhaler PRN ~ 1/week    #Allergies  -Resolved     #Gout  - No recent flares; on allopurinol daily      #COVID vaccine  - Received both COVID vaccines: April, May 2021    #Tobacco use  - Started smoking age 89 - age 67. Max usage: 1 pack / month    #Periscrotal itch   - Ongoing for ~ 6 months  - Denies rash, but notes some skin thickening   - Resolves with baths  - Not interested in exam or antifungal cream now    #BMI 48  - Interested in increasing walking and working on nutrition. Not interested in nutritionist referral    Outpatient Medications Prior to Visit   Medication Sig Dispense Refill    acetaminophen 325 MG tablet Take 1 tablet (325 mg) by mouth every 6 hours as needed for pain. 50 tablet 1    Albuterol Sulfate HFA 108 (90 Base) MCG/ACT Inhalation Aero Soln Inhale 2 puffs by mouth every 6 hours as needed for shortness of breath/wheezing. 1 Inhaler 6    allopurinol 100 MG tablet Take 2 tablets (200 mg) by mouth daily. 60 tablet 5    atorvastatin 40 MG tablet Take 1 tablet (40 mg) by mouth every evening. To lower cholesterol. 90 tablet 3    colchicine 0.6 MG tablet Take 2 tablets by mouth at first sign of flare followed by 1 table 1 hour later 30 tablet 0    Fluticasone Propionate HFA 110 MCG/ACT Inhalation Aerosol Inhale 1 puff by  mouth every 12 hours. 1 Inhaler 6    ketoconazole 2 % shampoo apply ON SCALP, LATHER AND RINSE OFF DAILY 120 mL 0    loratadine 10 MG tablet Take 1 tablet (10 mg) by mouth daily as needed for allergies. 30 tablet 2    Triamcinolone Acetonide 0.1 % External Ointment Apply to affected area on neck 2 times a day. 15 g 0     No facility-administered medications prior to visit.        Objective:  BP (!) 147/70    Pulse 68    Temp 36.4 C (Temporal)    Resp 16    Wt (!) 120.7 kg (266 lb)    BMI 48.65 kg/m      Wt Readings from Last 3 Encounters:   12/26/19 (!) 120.7 kg (266 lb)   10/25/19 (!) 121.1 kg (267 lb)   07/11/19 (!) 117.5 kg (259 lb)      BP Readings from Last 3 Encounters:   12/26/19 (!) 147/70   10/25/19 138/82   07/11/19 124/62       Gen: NAD  Resp: nonlabored WOB  on ambient air  Ext: WWP, no LE edema  Neuro: Gait stable, language fluent   Psych: mood and affect appropriate        Results for orders placed or performed in visit on 07/25/19   Occult Blood By IA, Stool   Result Value Ref Range    Occult Bld 1 Result Negative NRN       Assessment/Plan:  Nathan [name] was seen today for follow-up .    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 Description of itch with skin thickening concerning for tinea cruris vs intertrigo. Will consider clotrimazole ointment for tinea cruris if patient's symptoms do not resolve. Also encouraged patient to try to keep area dry     Healthcare Maintenance:     Cancer screening  - FH: No hx of cancer  - Prostate cancer discussion: deferred  - Lung cancer: not indicated (Age 60-80 if 30 pack year hx and current smoker or quit within 15 years, annual low dose CT)   - Colon cancer: Due for FIT testing 07/2020    Cardiovascular  - AAA screening: Discussed  risks and benefits of AAA screening, even with limited tobacco use. Patient declines screening now.  The 10-year ASCVD risk score Mikey Bussing DC Brooke Bonito., et al., 2013) is: 12.8%    Values used to calculate the score:      Age: 69 years      Sex: Male      Is Non-Hispanic African American: Yes      Diabetic: No      Tobacco smoker: No      Systolic Blood Pressure: 299 mmHg      Is BP treated: No      HDL Cholesterol: 44 mg/dL      Total Cholesterol: 146 mg/dL  LDL 96, 10/2015, on atorvastatin 40mg . Can consider increasing to 80mg  at next visit   - DM screening: last A1c 6.5, 06/2019     ID  - HIV: negative 06/2012   - Hep C: nonreactive 06/2012    Immunizations  Immunization History   Administered Date(s) Administered    COVID-19 Moderna mRNA LNP-S PF 09/19/2019, 10/18/2019    Hepatitis B, unspecified 10/05/2007, 11/05/2007, 05/08/2008    Influenza quadrivalent PF 04/19/2013, 06/12/2014, 05/01/2015    Influenza quadrivalent adjuvanted (Fluad) 05/19/2019    Influenza trivalent PF 07/07/2012    Influenza, unspecified 09/26/2010    Pneumococcal polysaccharide PPSV23 (Pneumovax 23) 02/12/2009    Tdap 01/21/2011     - Td/Tdap: due 2022   - Zoster: not interested in vaccine at this time  - Pneumococcal: not interested in vaccine at this time    Follow-up: 6 months    Discussed patient with: Dr. Thomasenia Bottoms, attending physician    Kathreen Cornfield, MD  Internal Medicine Resident, PGY-1

## 2019-12-26 NOTE — Patient Instructions (Signed)
It was great to meet you today Nathan [name].

## 2019-12-27 LAB — HEMOGLOBIN A1C, HPLC: Hemoglobin A1C: 7.5 % — ABNORMAL HIGH (ref 4.0–6.0)

## 2019-12-28 NOTE — Progress Notes (Signed)
ATTENDING NOTE  I have personally discussed the case with Sondra Come, MD during or immediately after the patient visit including review of history, physical exam, diagnosis, and treatment plan. I agree with the assessment and plan of care.

## 2020-02-09 ENCOUNTER — Telehealth (HOSPITAL_BASED_OUTPATIENT_CLINIC_OR_DEPARTMENT_OTHER): Payer: Self-pay

## 2020-02-09 DIAGNOSIS — M1A3790 Chronic gout due to renal impairment, unspecified ankle and foot, without tophus (tophi): Secondary | ICD-10-CM

## 2020-02-09 MED ORDER — ALLOPURINOL 100 MG OR TABS
200.0000 mg | ORAL_TABLET | Freq: Every day | ORAL | 1 refills | Status: DC
Start: 2020-02-09 — End: 2021-05-13

## 2020-02-09 NOTE — Telephone Encounter (Signed)
RETURN CALL: Voicemail - Detailed Message      SUBJECT:  Refill Request    NAME OF MEDICATION(S): allopurinol 100 MG tablet  DATE NEEDED BY: ASAP   PRESCRIBING PROVIDER: Myrtie Cruise,  PHARMACY NAME/LOCATION: [address] [phone] [place] [phone]   P: [phone]  F: [phone]    ADDITIONAL INFORMATION: Please reach out to pt once the request has been sent.

## 2020-03-16 IMAGING — CT CT CHEST/ABDOMEN/PELVIS WITH CONTRAST
2 of 5 series · 14 of 46 positions shown, 16 images · IV contrast (APPLIED)
Comparison: There are no previous exams available for comparison.

CT CHEST/ABDOMEN/PELVIS WITH CONTRAST, 03/16/2020 [DATE]: 
CLINICAL INDICATION:  Fatigue, shortness of breath for one month, no pain, 
anemia on labs 
A search for DICOM formatted images was conducted for prior CT imaging studies 
completed at a non-affiliated media free facility.
TECHNIQUE: The chest, abdomen and pelvis were scanned from base of neck through 
the pubic rami with 722cc of Isovue 300 injected intravenously on a high 
resolution low dose CT scanner. Routine MPR and MIP 3D renderings were 
reconstructed on an independent workstation with concurrent physician 
supervision.

[Series 9: axial portal · axial · portal-venous · 0.98mm/px · z∈[-700,-208]mm · 11 of 190 slices shown, 13 images]
[im 13/190  soft-tissue]
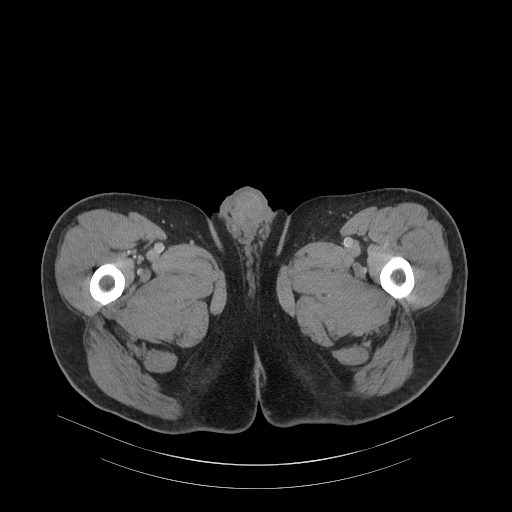
[im 13/190  bone]
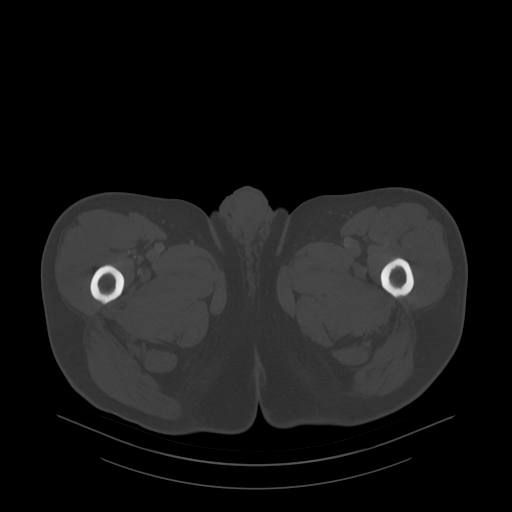
[im 26/190  soft-tissue]
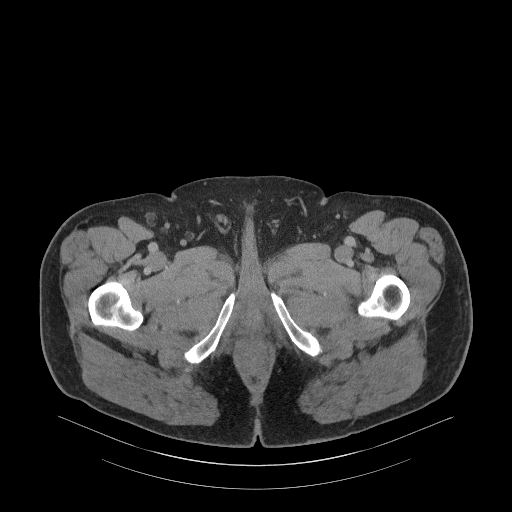
[im 51/190  soft-tissue]
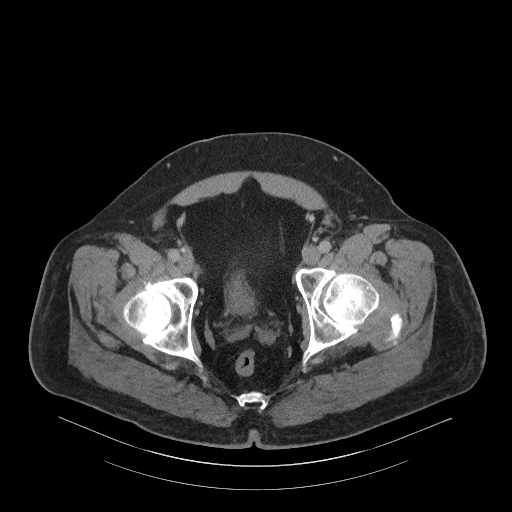
[im 64/190  soft-tissue]
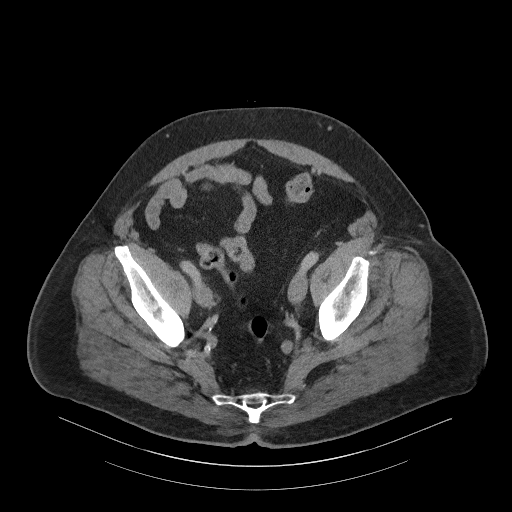
[im 76/190  soft-tissue]
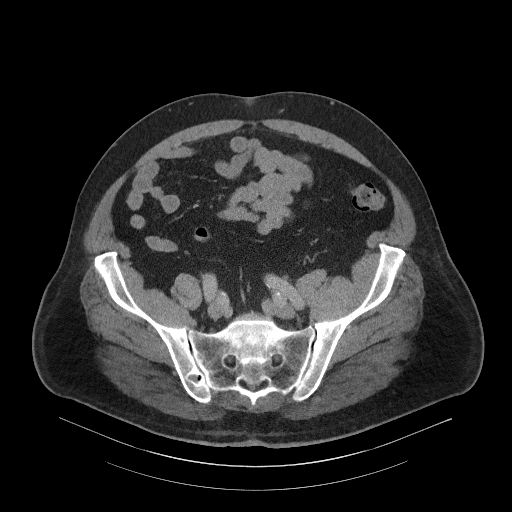
[im 101/190  soft-tissue]
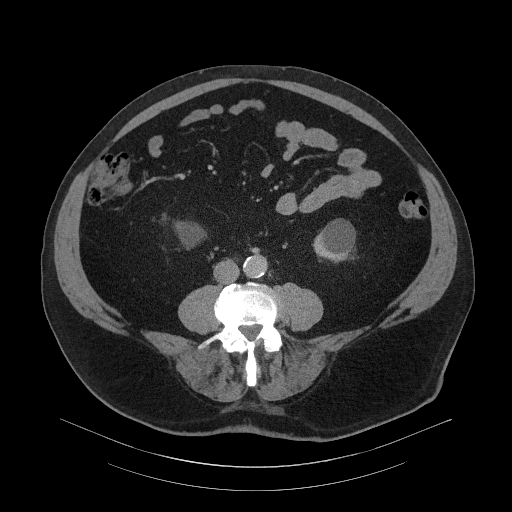
[im 114/190  soft-tissue]
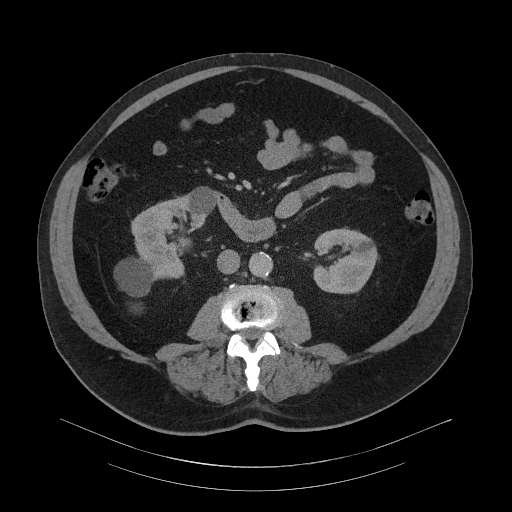
[im 127/190  soft-tissue]
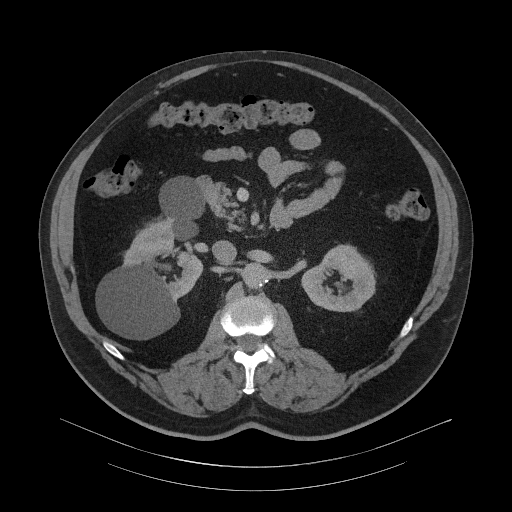
[im 139/190  soft-tissue]
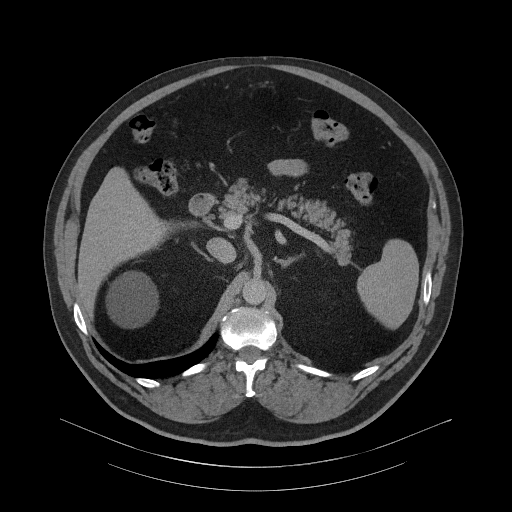
[im 139/190  bone]
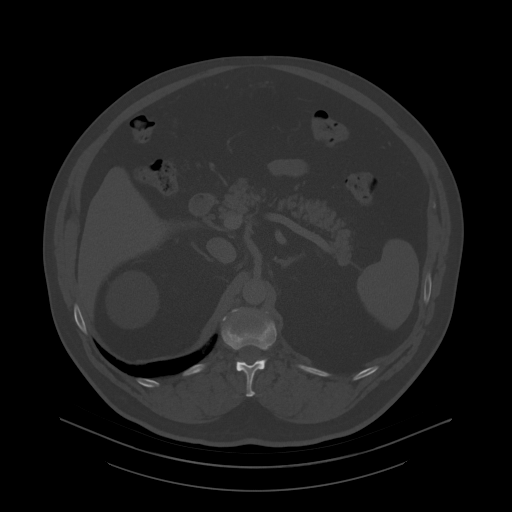
[im 164/190  soft-tissue]
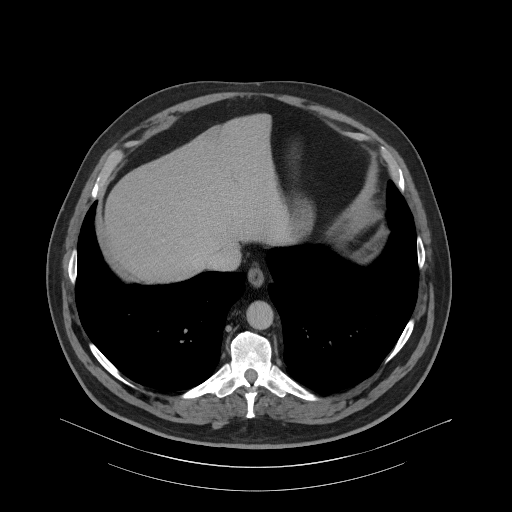
[im 177/190  soft-tissue]
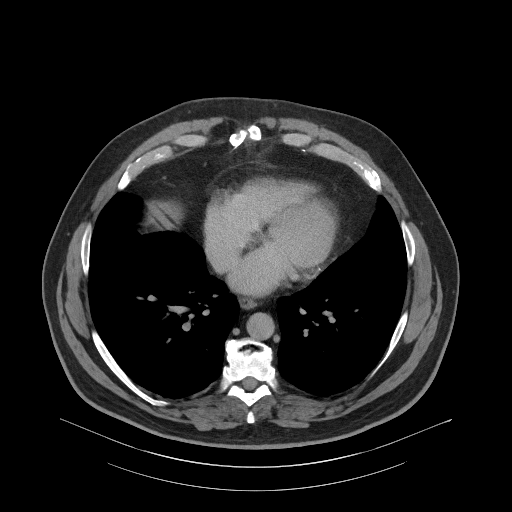

[Series 10: cor portal · coronal · portal-venous · 1.01mm/px · 3 of 165 slices shown]
[im 55/165  soft-tissue]
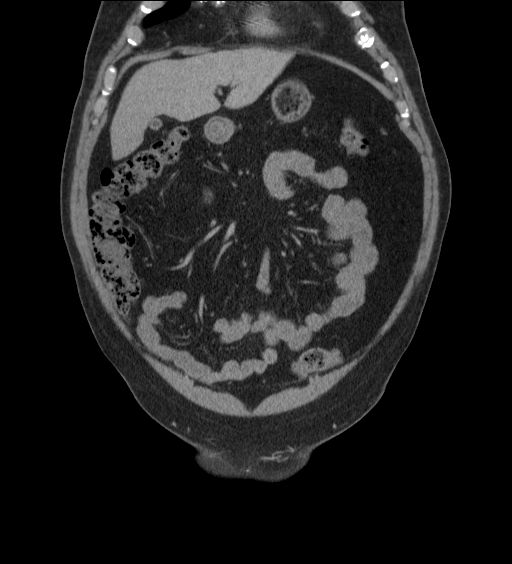
[im 73/165  soft-tissue]
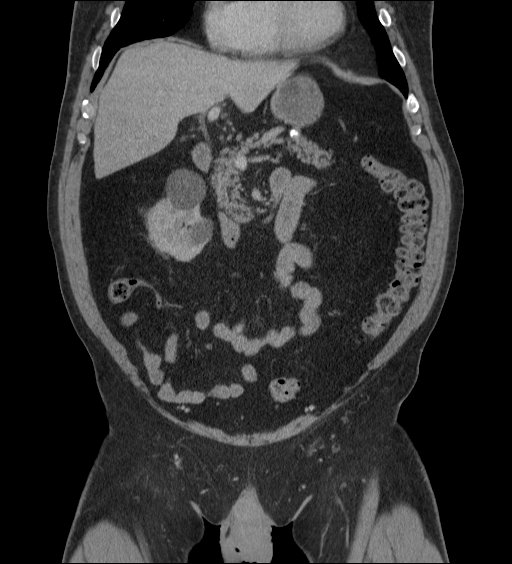
[im 92/165  soft-tissue]
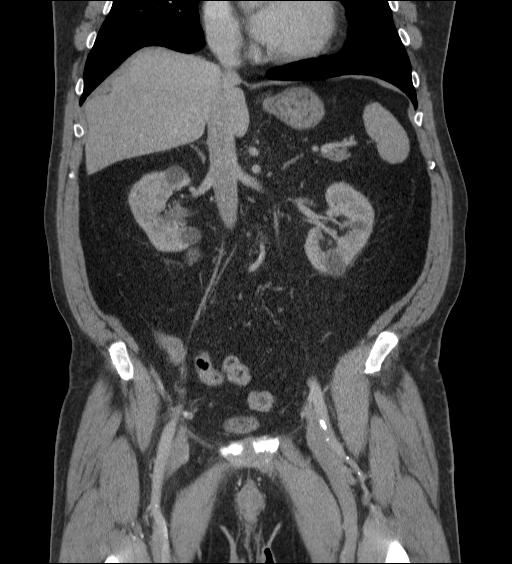

[14 of 46 positions shown; findings below may reference images not displayed]

FINDINGS: Thyroid is normal. There is no supra clavicular or axillary lymphadenopathy by 
size criteria. 
Median sternotomy wires are noted for prior CABG. Also retained pacer leads from 
temporary pacemaker in the anterior lower chest soft tissues. 
Hemidiaphragms are normally positioned. There is no pleural effusion. 
The heart is borderline enlarged. There is no pericardial effusion. Native 
coronary vessels demonstrate a moderate to large amount of calcified coronary 
atherosclerosis. Patient status post bypass grafting. 
The ascending thoracic aorta is nonaneurysmal. The main pulmonary artery is 
within normal limits of size. 
There is no mediastinal or hilar lymphadenopathy by CT size criteria. 
The lungs are well aerated. There is no focal consolidation, pneumothorax, or 
effusion. There is no airway thickening. There is no bronchiectasis. 
No suspicious pulmonary mass. No worrisome pulmonary nodularity. 
Liver is normal in size and contour. There is no intrahepatic biliary ductal 
dilation. No suspicious liver lesion is appreciated. Tiny right hepatic lobe 
hypodensity with sharp transition likely representing a cyst. 
The gallbladder is decompressed and normal in appearance. 
The adrenal glands are normal. 
The spleen normal in size and configuration. 
The pancreas is without ductal dilation. There is no suspicious pancreatic mass. 
There is no peripancreatic inflammatory change. 
There is a nonobstructing calcification in the left kidney measuring 7 mm. There 
is a punctate calcification in the midportion of the right kidney measuring 3 
mm. No hydronephrosis involving either kidney. Circumscribed hypodensities 
involving both kidneys consistent with simple renal cyst. No suspicious renal 
lesion. 
Prostate appears normal. 
Urinary bladder is decompressed which limits thorough evaluation however there 
is no appreciable abnormality. 
Rectal region appears normal. The sigmoid colon is largely decompressed. There 
is minimal sigmoid diverticular disease. There is no abnormal thickening and no 
pericolonic inflammatory change. The descending colon is normal. The transverse 
colon is normal. Ascending colon is normal. Normal appendix right lower 
quadrant. Small bowel normal in caliber. No evidence for obstruction. Stomach 
appears normal. The duodenum is normal. 
The abdominal aorta demonstrates a mild amount of calcified atherosclerosis. The 
inferior vena cava appears normal. 
There is no mesenteric or retroperitoneal lymphadenopathy. 
There is a very small fat-containing right inguinal hernia. No bowel protrudes 
into this defect. There is a small fat-containing umbilical hernia. Again no 
bowel protrudes into this defect. Soft tissues are otherwise normal. 
There are degenerative changes throughout the spine. There are degenerative type 
geodes associated with the sacroiliac joints bilaterally, right greater than 
left. There is some vacuum phenomenon present within the right sacroiliac geode 
degenerative changes of the hips also demonstrating subchondral cystic change.
IMPRESSION: 1.  No CT evidence for neoplasm throughout the chest, abdomen, or pelvis. No 
findings to explain the patient's shortness of breath or anemia. 
2.  Borderline cardiomegaly with evidence of prior CABG. Moderate amount of 
calcified atherosclerosis involving the native coronary arteries. 
3.  Nonobstructing bilateral nephrolithiasis measuring 7 mm on the left and 3 mm 
on the right. 
4.  Very small umbilical hernia and right inguinal hernia containing fat. 
5.  Bilateral renal hypodensities consistent with cysts. 
6.  Minimal diverticular disease of sigmoid colon without evidence for 
diverticulitis. 
RADIATION DOSE REDUCTION: All CT scans are performed using radiation dose 
reduction techniques, when applicable.  Technical factors are evaluated and 
adjusted to ensure appropriate moderation of exposure.  Automated dose 
management technology is applied to adjust the radiation doses to minimize 
exposure while achieving diagnostic quality images.

## 2020-06-21 IMAGING — DX FOOT 3 VIEWS RIGHT
1 series · 4 of 4 positions shown · non-contrast
Comparison: none

FOOT 3 VIEWS RIGHT, 06/21/2020 [DATE]: 
CLINICAL INDICATION:  Pain lateral RIGHT foot. Climbing a ladder.

[Series 1: AP · U · 0.14mm/px · 4 of 4 slices shown]
[im 1/4]
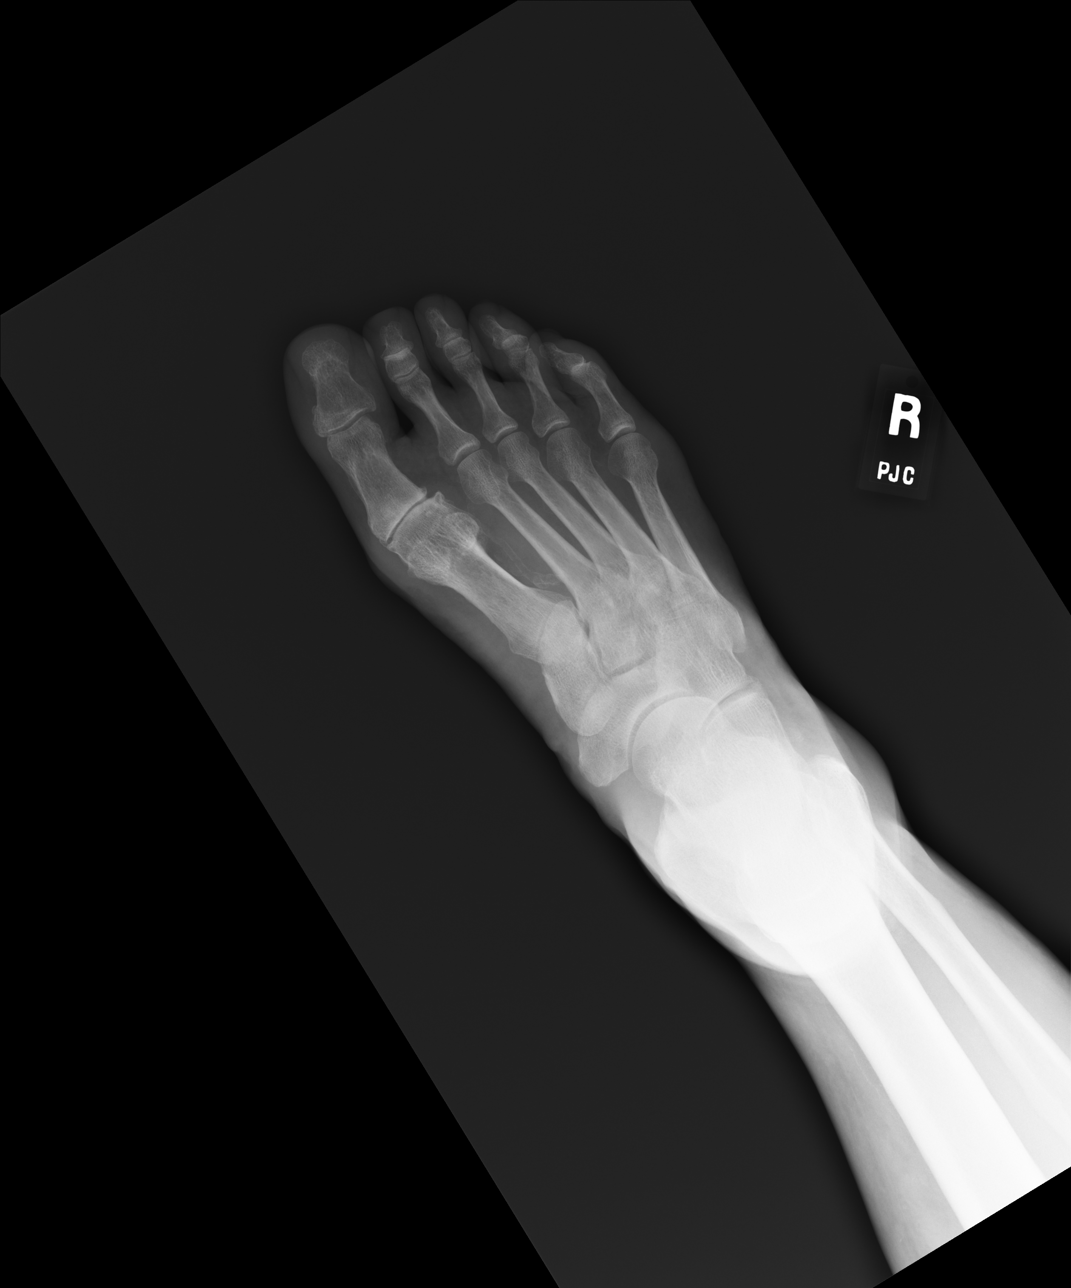
[im 2/4]
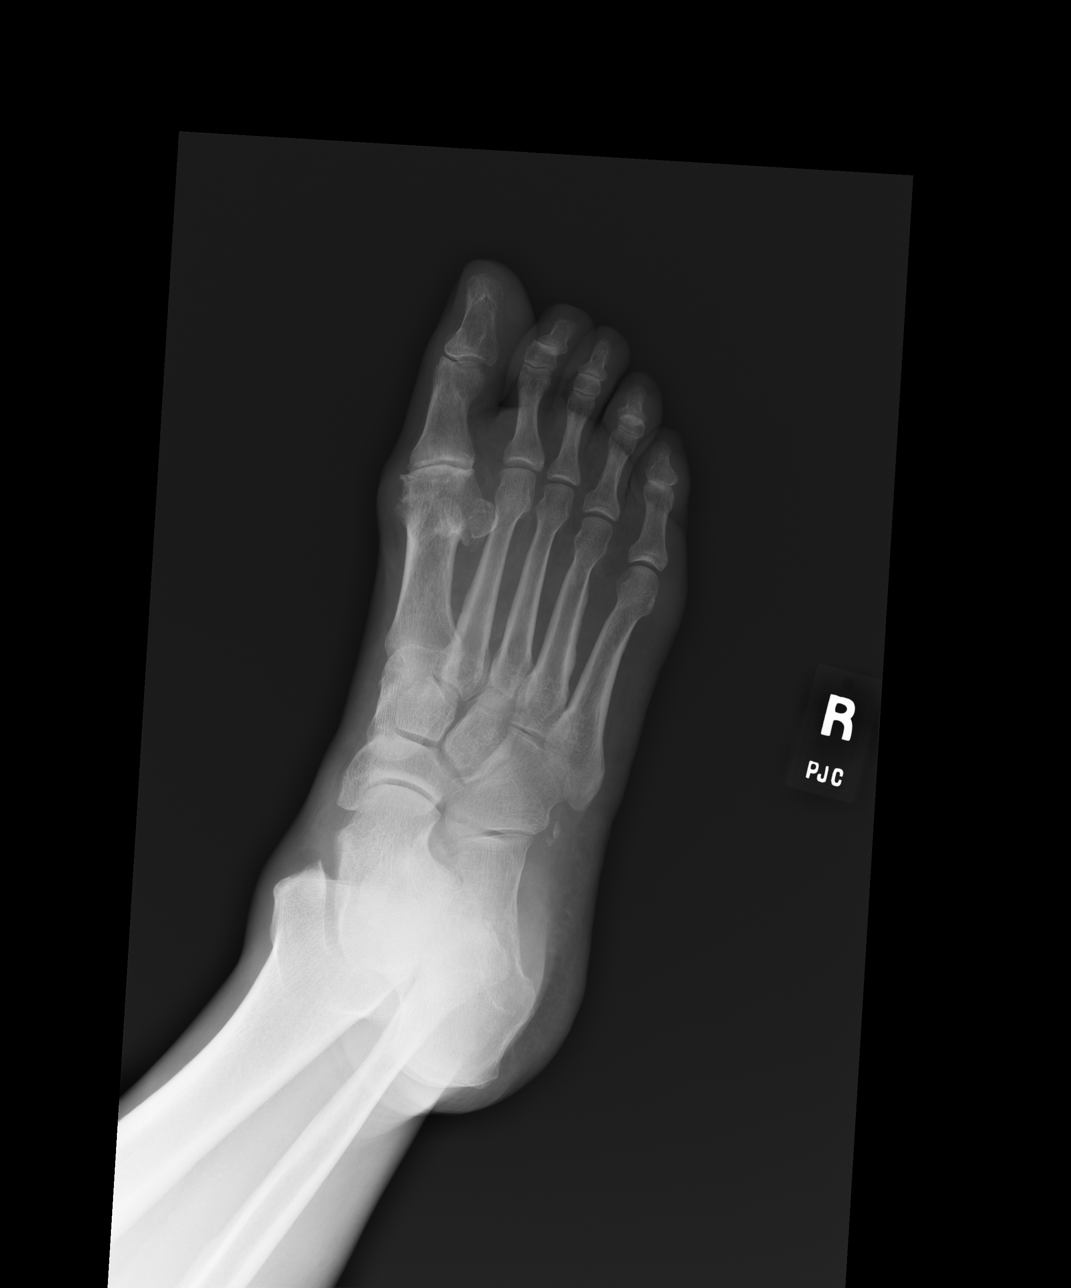
[im 3/4]
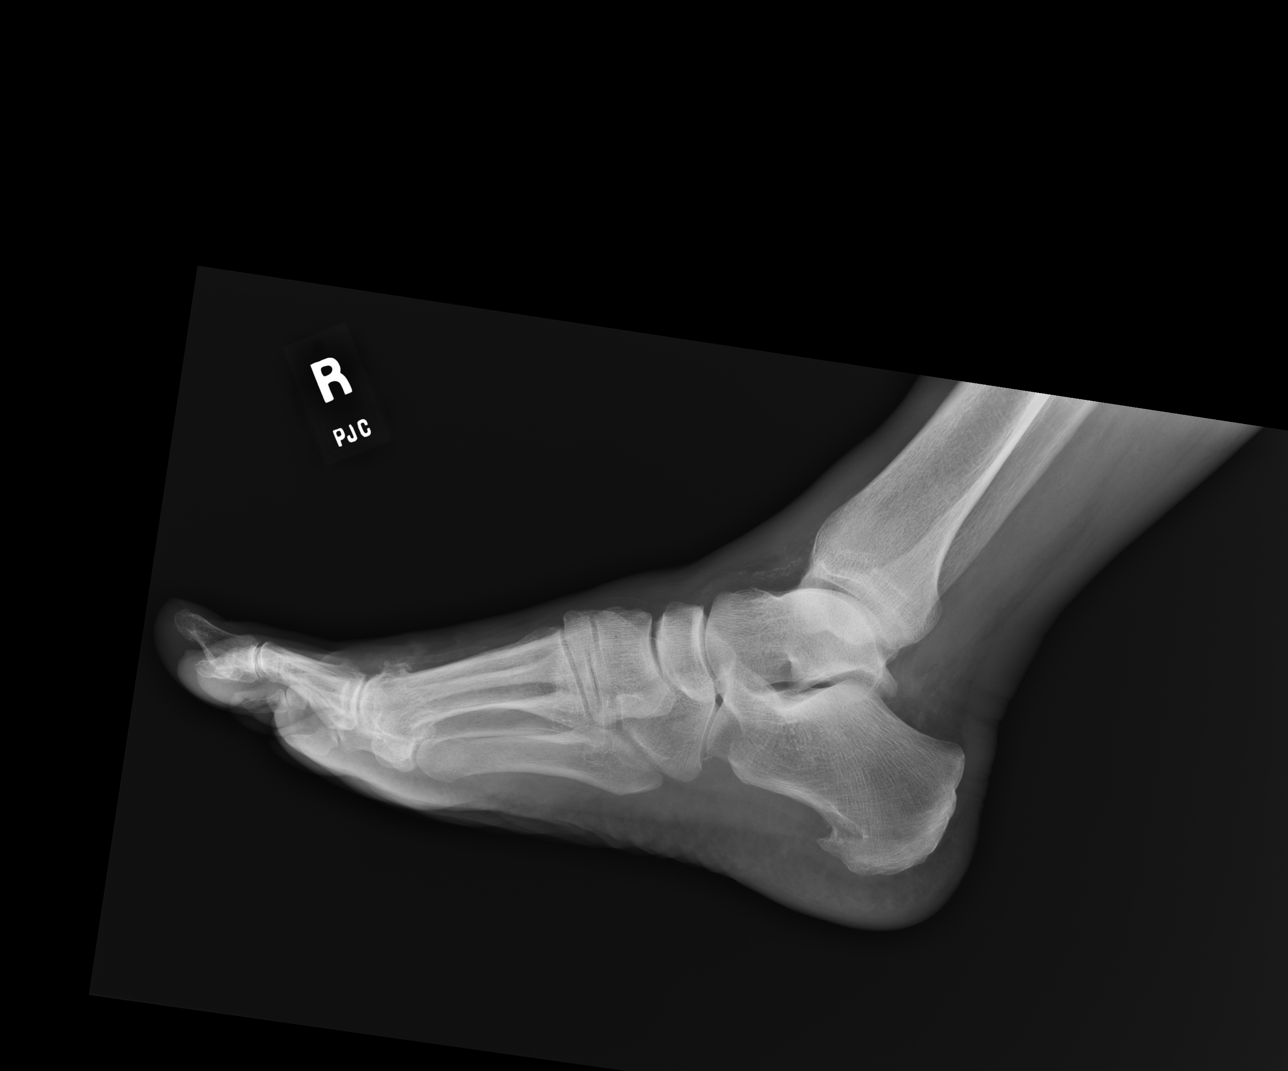
[im 4/4]
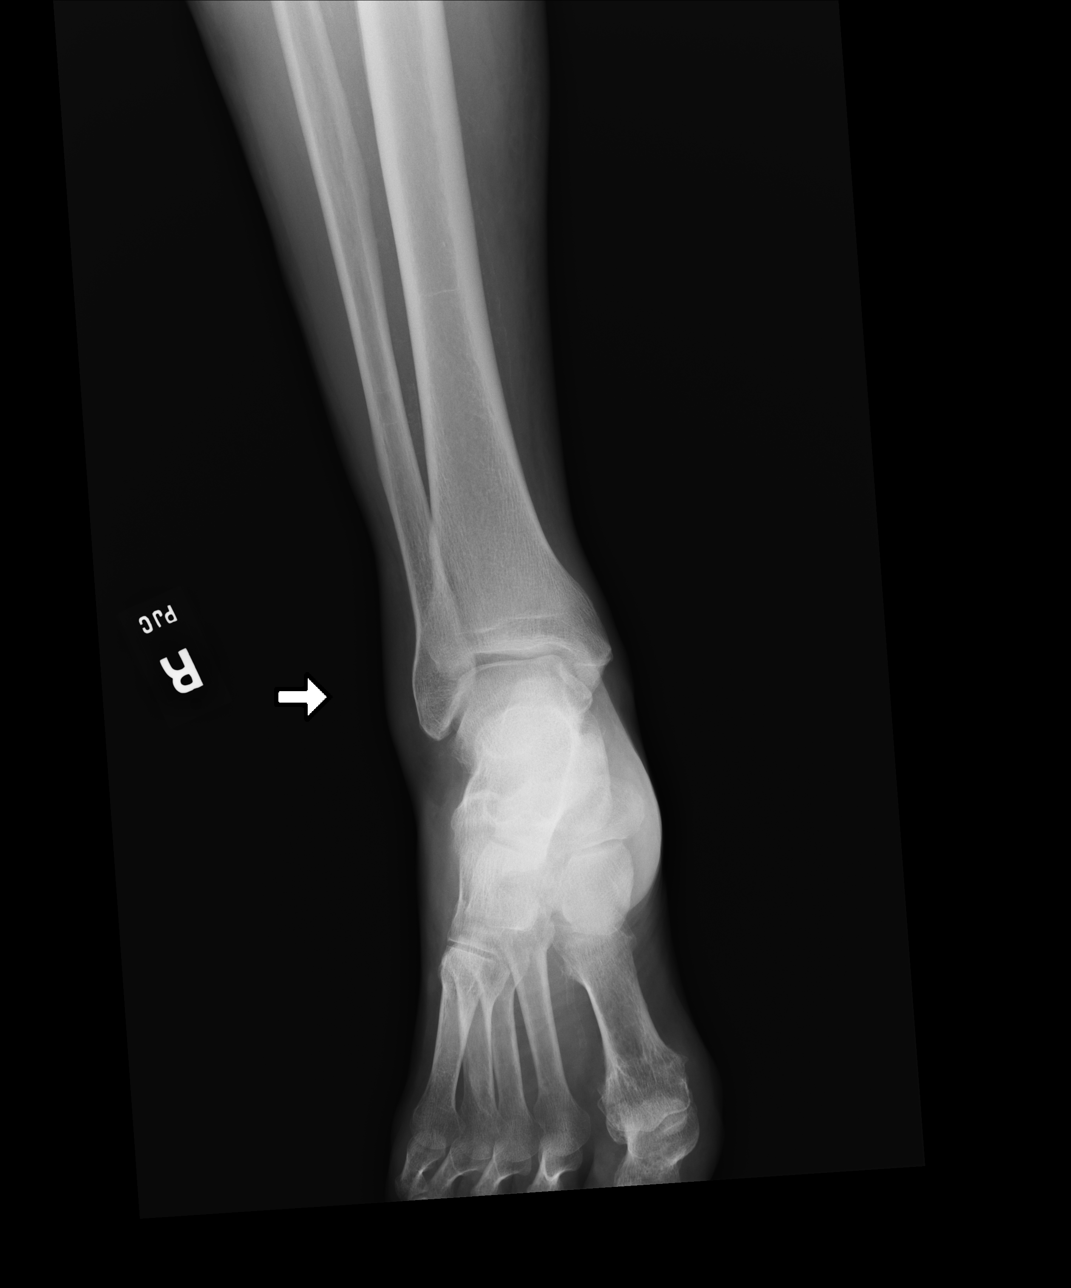

[4 of 4 positions shown; findings below may reference images not displayed]

FINDINGS: Degenerative narrowing at the MTP joint of the great toe with 
spurring. Remaining bones are intact. Joint spaces are well-maintained. 
Swelling over the lateral ankle.
IMPRESSION: Swelling over the lateral ankle. No fracture. 
Degenerative narrowing MTP joint great toe.

## 2020-09-10 ENCOUNTER — Ambulatory Visit (HOSPITAL_BASED_OUTPATIENT_CLINIC_OR_DEPARTMENT_OTHER): Payer: Self-pay

## 2020-09-11 ENCOUNTER — Telehealth (HOSPITAL_BASED_OUTPATIENT_CLINIC_OR_DEPARTMENT_OTHER): Payer: Self-pay | Admitting: Internal Medicine

## 2020-09-11 DIAGNOSIS — R7303 Prediabetes: Secondary | ICD-10-CM

## 2020-09-11 DIAGNOSIS — J3089 Other allergic rhinitis: Secondary | ICD-10-CM

## 2020-09-11 DIAGNOSIS — E785 Hyperlipidemia, unspecified: Secondary | ICD-10-CM

## 2020-09-11 MED ORDER — ATORVASTATIN CALCIUM 40 MG OR TABS
ORAL_TABLET | ORAL | 0 refills | Status: DC
Start: 2020-09-11 — End: 2021-05-13

## 2020-09-11 MED ORDER — LORATADINE 10 MG OR TABS
10.0000 mg | ORAL_TABLET | Freq: Every day | ORAL | 2 refills | Status: DC | PRN
Start: 2020-09-11 — End: 2022-01-15

## 2020-09-11 NOTE — Telephone Encounter (Signed)
Call Type:  Triage Call    [place] Name:  [place] Name:  [facility] [phone]    Presenting Problem:  pt is experiencing allergy sinus symptons. itchy eyes, watery eyes, sneezing and sniffles.    Assessment:  01. Symptoms: What are your symptoms? Itchy watery eyes, sneezing, runny nose  02. Onset: When did your symptoms/concern begin? (minutes, hours, days, or months ago?) 24 hrs  03. Action: What actions or treatments have you taken to treat your symptoms/concern? Albuterol Inhaler 2 puffs 8-10 hrs ago and another inhaler he dosen't know the name of that he uses once a day  04. Response: What was the response to your actions taken? did help his wheezing  05. Severity: How severe are your symptoms? (ex. interference with normal activities, pain scale, size or appearance of injury) Constant watering of eyes, mild intermittent itching of eyes; runny nose intermittently; sneezing  06. Additional/Further RN Assessment: (ex. weight, I/O, etc.) PMH: Asthma and Hay Fever  05. Severity: How severe are your symptoms? (ex. interference with normal activities, pain scale, size or appearance of injury) Mild wheezing    Guideline Title:  Nasal Allergies (Hay Fever) (Adult)    Guideline Question:  [1] Wheezing (high pitched whistling sound) AND [2] previous asthma attacks or use of asthma medicines? Yes    Recommended Disposition:  See PCP When Office is Open (within 3 days)    Guideline Title:  Asthma Attack (Adult)    Guideline Question:  [1] Mild wheezing is intermittent AND [2] persists > 3 days? Yes    Recommended Disposition:  See PCP When Office is Open (within 3 days)    Original Inclination:  Called your Doctor Community education officer, Pharmacist)    Intended Action:  See PCP When Office is Open (within 3 days)    Care Advice Guideline Title:  Nasal Allergies (Hay Fever) (Adult)    Care Advice:  Care Advice given per Asthma Attack (Adult) guideline.  Asthma Quick-Relief Medicine:  * Start your quick-relief medicine  (e.g., albuterol, salbutamol) at the first sign of any coughing or shortness of breath (don't wait for wheezing).  * Use inhaler (2 puffs each time) or nebulizer every 4 hours.  * The best "cough medicine" for an adult with asthma is always the asthma medicine. Note: Don't use cough suppressants, but Cough Drops may help a tickly cough.  * Continue the quick-relief asthma medicine until you have not wheezed or coughed for 48 hours. It takes a minimum of 7 days of medicine for lung function to return to normal.  Drinking Fluids and Using a Humidifier:  * Drink a normal amount of liquids (e.g., water). Being adequately hydrated makes it easier to cough up the sticky lung mucus.  * If the air is dry, use a humidifier to prevent drying of the upper airway.  Hay Fever:  * If you have symptoms from hay fever, it's OK to take antihistamines.  * (Reasons: poor control of allergic rhinitis makes asthma worse whereas antihistamines don't make asthma worse).  Remove Allergens:  * Take a shower to remove pollens, animal dander, or other allergens from the body and hair.  Call Back If:  * Wheezing is not improved after neb or inhaler  * Inhaled asthma medicine (neb or MDI) is needed more often than every 4 hours  * Wheezing is not completely cleared by 5 days  * You become worse.  See PCP Within 3 Days:  * You need  to be seen within 2 or 3 days.  * PCP Visit: Call your doctor (or NP/PA) during regular office hours and make an appointment. A clinic or urgent care center are good places to go for care if your doctor's office is closed or you can't get an appointment. Note: If office will be open tomorrow, tell caller to call then, not in 3 days.  * If Patient Has No PCP: A clinic or urgent care center are good places to go for care if you do not have a primary care provider. Note: Try to help caller find a PCP for future care (e.g., use a physician referral line). Having a PCP or "medical home" means better long-term care.    Care  Advice Guideline Title:  Asthma Attack (Adult)    Care Advice:  Care Advice given per Asthma Attack (Adult) guideline.  Asthma Quick-Relief Medicine:  * Start your quick-relief medicine (e.g., albuterol, salbutamol) at the first sign of any coughing or shortness of breath (don't wait for wheezing).  * Use inhaler (2 puffs each time) or nebulizer every 4 hours.  * The best "cough medicine" for an adult with asthma is always the asthma medicine. Note: Don't use cough suppressants, but Cough Drops may help a tickly cough.  * Continue the quick-relief asthma medicine until you have not wheezed or coughed for 48 hours. It takes a minimum of 7 days of medicine for lung function to return to normal.  Drinking Fluids and Using a Humidifier:  * Drink a normal amount of liquids (e.g., water). Being adequately hydrated makes it easier to cough up the sticky lung mucus.  * If the air is dry, use a humidifier to prevent drying of the upper airway.  Hay Fever:  * If you have symptoms from hay fever, it's OK to take antihistamines.  * (Reasons: poor control of allergic rhinitis makes asthma worse whereas antihistamines don't make asthma worse).  Remove Allergens:  * Take a shower to remove pollens, animal dander, or other allergens from the body and hair.  Call Back If:  * Wheezing is not improved after neb or inhaler  * Inhaled asthma medicine (neb or MDI) is needed more often than every 4 hours  * Wheezing is not completely cleared by 5 days  * You become worse.  See PCP Within 3 Days:  * You need to be seen within 2 or 3 days.  * PCP Visit: Call your doctor (or NP/PA) during regular office hours and make an appointment. A clinic or urgent care center are good places to go for care if your doctor's office is closed or you can't get an appointment. Note: If office will be open tomorrow, tell caller to call then, not in 3 days.  * If Patient Has No PCP: A clinic or urgent care center are good places to go for care if you do not  have a primary care provider. Note: Try to help caller find a PCP for future care (e.g., use a physician referral line). Having a PCP or "medical home" means better long-term care.

## 2020-09-11 NOTE — Telephone Encounter (Signed)
RETURN CALL: Voicemail - Detailed Message      SUBJECT:  Refill Request    NAME OF MEDICATION(S): loratadine 10 MG tablet, atorvastatin 40 MG tablet  DATE NEEDED BY: ASAP  PRESCRIBING PROVIDER: [name], MD  PHARMACY NAME/LOCATION: [facility]  ADDITIONAL INFORMATION: patient states he called pharmacy and they stated the prescriptions were expired. Please advise.

## 2020-09-11 NOTE — Telephone Encounter (Signed)
Patient is due for yearly exam.  One refill authorized.  Please schedule follow up visit.

## 2020-11-01 ENCOUNTER — Emergency Department (EMERGENCY_DEPARTMENT_HOSPITAL): Payer: Medicare HMO

## 2020-11-01 ENCOUNTER — Inpatient Hospital Stay (HOSPITAL_COMMUNITY): Payer: Medicare HMO | Admitting: Pulmonary Disease

## 2020-11-01 ENCOUNTER — Emergency Department (HOSPITAL_COMMUNITY): Payer: Self-pay

## 2020-11-01 ENCOUNTER — Inpatient Hospital Stay (HOSPITAL_COMMUNITY): Payer: Medicare HMO

## 2020-11-01 ENCOUNTER — Inpatient Hospital Stay
Admission: EM | Admit: 2020-11-01 | Discharge: 2020-11-05 | DRG: 175 | Disposition: A | Discharge status: Home / Self Care | Payer: Medicare HMO | Attending: Internal Medicine | Admitting: Internal Medicine

## 2020-11-01 ENCOUNTER — Other Ambulatory Visit: Payer: Self-pay

## 2020-11-01 DIAGNOSIS — I10 Essential (primary) hypertension: Secondary | ICD-10-CM

## 2020-11-01 DIAGNOSIS — N179 Acute kidney failure, unspecified: Secondary | ICD-10-CM | POA: Diagnosis present

## 2020-11-01 DIAGNOSIS — I444 Left anterior fascicular block: Secondary | ICD-10-CM

## 2020-11-01 DIAGNOSIS — R0602 Shortness of breath: Secondary | ICD-10-CM

## 2020-11-01 DIAGNOSIS — R778 Other specified abnormalities of plasma proteins: Secondary | ICD-10-CM

## 2020-11-01 DIAGNOSIS — E1310 Other specified diabetes mellitus with ketoacidosis without coma: Secondary | ICD-10-CM

## 2020-11-01 DIAGNOSIS — I50810 Right heart failure, unspecified: Secondary | ICD-10-CM

## 2020-11-01 DIAGNOSIS — J9691 Respiratory failure, unspecified with hypoxia: Secondary | ICD-10-CM | POA: Diagnosis present

## 2020-11-01 DIAGNOSIS — I452 Bifascicular block: Secondary | ICD-10-CM

## 2020-11-01 DIAGNOSIS — I214 Non-ST elevation (NSTEMI) myocardial infarction: Secondary | ICD-10-CM

## 2020-11-01 DIAGNOSIS — R579 Shock, unspecified: Secondary | ICD-10-CM | POA: Diagnosis present

## 2020-11-01 DIAGNOSIS — J9690 Respiratory failure, unspecified, unspecified whether with hypoxia or hypercapnia: Secondary | ICD-10-CM

## 2020-11-01 DIAGNOSIS — I2699 Other pulmonary embolism without acute cor pulmonale: Secondary | ICD-10-CM

## 2020-11-01 DIAGNOSIS — E1100 Type 2 diabetes mellitus with hyperosmolarity without nonketotic hyperglycemic-hyperosmolar coma (NKHHC): Secondary | ICD-10-CM | POA: Diagnosis present

## 2020-11-01 DIAGNOSIS — I5189 Other ill-defined heart diseases: Secondary | ICD-10-CM

## 2020-11-01 DIAGNOSIS — R55 Syncope and collapse: Secondary | ICD-10-CM

## 2020-11-01 DIAGNOSIS — E1110 Type 2 diabetes mellitus with ketoacidosis without coma: Secondary | ICD-10-CM | POA: Diagnosis present

## 2020-11-01 DIAGNOSIS — E869 Volume depletion, unspecified: Secondary | ICD-10-CM

## 2020-11-01 DIAGNOSIS — I4510 Unspecified right bundle-branch block: Secondary | ICD-10-CM

## 2020-11-01 DIAGNOSIS — R000 Tachycardia, unspecified: Secondary | ICD-10-CM

## 2020-11-01 DIAGNOSIS — M109 Gout, unspecified: Secondary | ICD-10-CM | POA: Diagnosis present

## 2020-11-01 DIAGNOSIS — I2693 Single subsegmental pulmonary embolism without acute cor pulmonale: Principal | ICD-10-CM | POA: Diagnosis present

## 2020-11-01 DIAGNOSIS — E1165 Type 2 diabetes mellitus with hyperglycemia: Secondary | ICD-10-CM

## 2020-11-01 DIAGNOSIS — E873 Alkalosis: Secondary | ICD-10-CM | POA: Diagnosis present

## 2020-11-01 DIAGNOSIS — R0902 Hypoxemia: Secondary | ICD-10-CM

## 2020-11-01 DIAGNOSIS — R7989 Other specified abnormal findings of blood chemistry: Secondary | ICD-10-CM

## 2020-11-01 LAB — CBC (HEMOGRAM)
Hematocrit: 47 % (ref 38.0–50.0)
Hemoglobin: 15.8 g/dL (ref 13.0–18.0)
MCH: 29.4 pg (ref 27.3–33.6)
MCHC: 33.5 g/dL (ref 32.2–36.5)
MCV: 88 fL (ref 81–98)
Platelet Count: 172 10*3/uL (ref 150–400)
RBC: 5.38 10*6/uL (ref 4.40–5.60)
RDW-CV: 12.9 % (ref 11.6–14.4)
WBC: 7.63 10*3/uL (ref 4.3–10.0)

## 2020-11-01 LAB — LAB ADD ON ORDER

## 2020-11-01 LAB — BLOOD GAS, VENOUS, W/ ICA, NA, K, LACT, GLU
Base Deficit Blood, VEN: 5.8 meq/L — ABNORMAL HIGH (ref 0.0–2.0)
Base Deficit Blood, VEN: 1.1 meq/L (ref 0.0–2.0)
Bicarbonate, VEN: 23 meq/L (ref 23–27)
Bicarbonate, VEN: 23 meq/L (ref 23–27)
Calcium (Ionized): 1.24 mmol/L (ref 1.18–1.38)
Calcium (Ionized): 1.24 mmol/L (ref 1.18–1.38)
Glucose: 800 mg/dL (ref 62–125)
Glucose: 624 mg/dL (ref 62–125)
Hemoglobin: 16.4 g/dL (ref 13.0–18.0)
Hemoglobin: 16.2 g/dL (ref 13.0–18.0)
L Lactate (Direct), Venous Whole Blood: 8.3 mmol/L — ABNORMAL HIGH (ref 0.6–1.9)
L Lactate (Direct), Venous Whole Blood: 3.1 mmol/L — ABNORMAL HIGH (ref 0.6–1.9)
Potassium: 5.2 meq/L (ref 3.7–5.2)
Potassium: 4.9 meq/L (ref 3.7–5.2)
Sodium: 134 meq/L — ABNORMAL LOW (ref 136–145)
Sodium: 136 meq/L (ref 136–145)
pCO2, VEN: 61 mmHg — ABNORMAL HIGH (ref 42–50)
pCO2, VEN: 38 mmHg — ABNORMAL LOW (ref 42–50)
pH, VEN: 7.21 — ABNORMAL LOW (ref 7.32–7.40)
pH, VEN: 7.4 (ref 7.32–7.40)
pO2, VEN: 23 mmHg — ABNORMAL LOW (ref 35–40)
pO2, VEN: 51 mmHg — ABNORMAL HIGH (ref 35–40)

## 2020-11-01 LAB — PHOSPHATE
Phosphate: 5.1 mg/dL — ABNORMAL HIGH (ref 2.5–4.5)
Phosphate: 3.8 mg/dL (ref 2.5–4.5)

## 2020-11-01 LAB — BASIC METABOLIC PANEL
Anion Gap: 19 — ABNORMAL HIGH (ref 4–12)
Anion Gap: 13 — ABNORMAL HIGH (ref 4–12)
Anion Gap: 14 — ABNORMAL HIGH (ref 4–12)
Anion Gap: 11 (ref 4–12)
Calcium: 9.5 mg/dL (ref 8.9–10.2)
Calcium: 9.3 mg/dL (ref 8.9–10.2)
Calcium: 9.1 mg/dL (ref 8.9–10.2)
Calcium: 9.1 mg/dL (ref 8.9–10.2)
Carbon Dioxide, Total: 21 meq/L — ABNORMAL LOW (ref 22–32)
Carbon Dioxide, Total: 25 meq/L (ref 22–32)
Carbon Dioxide, Total: 20 meq/L — ABNORMAL LOW (ref 22–32)
Carbon Dioxide, Total: 27 meq/L (ref 22–32)
Chloride: 91 meq/L — ABNORMAL LOW (ref 98–108)
Chloride: 95 meq/L — ABNORMAL LOW (ref 98–108)
Chloride: 100 meq/L (ref 98–108)
Chloride: 100 meq/L (ref 98–108)
Creatinine: 2.38 mg/dL — ABNORMAL HIGH (ref 0.51–1.18)
Creatinine: 2.05 mg/dL — ABNORMAL HIGH (ref 0.51–1.18)
Creatinine: 1.9 mg/dL — ABNORMAL HIGH (ref 0.51–1.18)
Creatinine: 1.8 mg/dL — ABNORMAL HIGH (ref 0.51–1.18)
Glucose: 841 mg/dL (ref 62–125)
Glucose: 689 mg/dL (ref 62–125)
Glucose: 605 mg/dL (ref 62–125)
Glucose: 445 mg/dL — ABNORMAL HIGH (ref 62–125)
Potassium: 5.1 meq/L (ref 3.6–5.2)
Potassium: 4.7 meq/L (ref 3.6–5.2)
Potassium: 4.9 meq/L (ref 3.6–5.2)
Potassium: 4.5 meq/L (ref 3.6–5.2)
Sodium: 131 meq/L — ABNORMAL LOW (ref 135–145)
Sodium: 133 meq/L — ABNORMAL LOW (ref 135–145)
Sodium: 134 meq/L — ABNORMAL LOW (ref 135–145)
Sodium: 138 meq/L (ref 135–145)
Urea Nitrogen: 31 mg/dL — ABNORMAL HIGH (ref 8–21)
Urea Nitrogen: 30 mg/dL — ABNORMAL HIGH (ref 8–21)
Urea Nitrogen: 29 mg/dL — ABNORMAL HIGH (ref 8–21)
Urea Nitrogen: 28 mg/dL — ABNORMAL HIGH (ref 8–21)
eGFR by CKD-EPI: 27 mL/min/{1.73_m2} — ABNORMAL LOW (ref >59)
eGFR by CKD-EPI: 32 mL/min/{1.73_m2} — ABNORMAL LOW (ref >59)
eGFR by CKD-EPI: 35 mL/min/{1.73_m2} — ABNORMAL LOW (ref >59)
eGFR by CKD-EPI: 38 mL/min/{1.73_m2} — ABNORMAL LOW (ref >59)

## 2020-11-01 LAB — TYPE AND SCREEN
ABO/Rh: O POS
Antibody Screen: NEGATIVE

## 2020-11-01 LAB — BLOOD GAS, VENOUS, W/ LACT
Base Excess, Blood, VEN: 3.9 meq/L — ABNORMAL HIGH (ref 0.0–3.0)
Bicarbonate, VEN: 30 meq/L — ABNORMAL HIGH (ref 23–27)
L Lactate (Direct), Venous Whole Blood: 2.5 mmol/L — ABNORMAL HIGH (ref 0.6–1.9)
pCO2, VEN: 55 mmHg — ABNORMAL HIGH (ref 42–50)
pH, VEN: 7.36 (ref 7.32–7.40)
pO2, VEN: 20 mmHg — ABNORMAL LOW (ref 35–40)

## 2020-11-01 LAB — GLUCOSE POC, HMC
Glucose (POC): >600 mg/dL (ref 62–125)
Glucose (POC): >600 mg/dL (ref 62–125)
Glucose (POC): >600 mg/dL (ref 62–125)
Glucose (POC): 579 mg/dL (ref 62–125)
Glucose (POC): 588 mg/dL (ref 62–125)
Glucose (POC): >600 mg/dL (ref 62–125)
Glucose (POC): 500 mg/dL — ABNORMAL HIGH (ref 62–125)
Glucose (POC): 577 mg/dL (ref 62–125)
Glucose (POC): 430 mg/dL — ABNORMAL HIGH (ref 62–125)
Glucose (POC): 399 mg/dL — ABNORMAL HIGH (ref 62–125)
Glucose (POC): 525 mg/dL (ref 62–125)
Glucose (POC): 351 mg/dL — ABNORMAL HIGH (ref 62–125)
Glucose (POC): 416 mg/dL — ABNORMAL HIGH (ref 62–125)
Glucose (POC): 271 mg/dL — ABNORMAL HIGH (ref 62–125)

## 2020-11-01 LAB — OSMOLALITY
Osmolality: 330 mosm/kg — ABNORMAL HIGH (ref 280–300)
Osmolality: 321 mosm/kg — ABNORMAL HIGH (ref 280–300)

## 2020-11-01 LAB — CALCIUM, (REFLEXIVE IONIZED)

## 2020-11-01 LAB — TRANSTHORACIC ECHO (TTE) COMPLETE
AoV max: 89.8 cm/s
Ascending aorta: 3.6 cm
EF: 68.4 %
IVSd: 0.98 cm
LV Diastolic Volume Index (BP): 93.4 ml
LV Systolic Volume (BP): 29.5 ml
LVIDd: 3.9 cm
LVIDs: 3.6 cm
LVPWd: 0.84 cm
RV FAC 2D: 15.5
RV Free wall pk S': 11 cm/s
RV Free wall pk S': 31.8 mmHg
RVDd: 6 cm
RVDd: 6 cm
TR Peak Vel: 268.5 cm/s

## 2020-11-01 LAB — URINALYSIS WITH REFLEX CULTURE
Bacteria, URN: NONE SEEN
Bilirubin (Qual), URN: NEGATIVE
Epith Cells_Renal/Trans,URN: NEGATIVE /HPF
Epith Cells_Squamous, URN: NEGATIVE /LPF
Leukocyte Esterase, URN: NEGATIVE
Nitrite, URN: NEGATIVE
Occult Blood, URN: NEGATIVE
RBC, URN: NEGATIVE /HPF
Specific Gravity, URN: 1.031 g/mL — ABNORMAL HIGH (ref 1.006–1.027)
WBC, URN: NEGATIVE /HPF
pH, URN: 5.5 (ref 5.0–8.0)

## 2020-11-01 LAB — PROTHROMBIN TIME
Prothrombin INR: 1.1 (ref 0.8–1.3)
Prothrombin Time Patient: 13.8 s (ref 10.7–15.6)

## 2020-11-01 LAB — 1ST EXTRA PEARL TOP

## 2020-11-01 LAB — 1ST EXTRA ORANGE TOP

## 2020-11-01 LAB — TROPONIN_I
Troponin_I Interpretation: ELEVATED
Troponin_I: 0.17 ng/mL — ABNORMAL HIGH (ref <0.04)
Troponin_I: 1.61 ng/mL (ref <0.04)

## 2020-11-01 LAB — PROTHROMBIN & PTT
Partial Thromboplastin Time: 63 s — ABNORMAL HIGH (ref 22–35)
Prothrombin INR: 1.2 (ref 0.8–1.3)
Prothrombin Time Patient: 14.5 s (ref 10.7–15.6)

## 2020-11-01 LAB — SARS-COV-2 (COVID-19) QUALITATIVE RAPID PCR: COVID-19 Coronavirus Qual PCR Result: NOT DETECTED

## 2020-11-01 LAB — PARTIAL THROMBOPLASTIN TIME: Partial Thromboplastin Time: 26 s (ref 22–35)

## 2020-11-01 LAB — B_TYPE NATRIURETIC PEPTIDE
B_Type Natriuretic Peptide: 29 pg/mL (ref <101)
B_Type Natriuretic Peptide: 38 pg/mL (ref <101)

## 2020-11-01 LAB — BLOOD GAS, VENOUS (NO ELECTROLYTES)
Base Deficit Blood, VEN: 2.2 meq/L — ABNORMAL HIGH (ref 0.0–2.0)
Bicarbonate, VEN: 24 meq/L (ref 23–27)
pCO2, VEN: 47 mmHg (ref 42–50)
pH, VEN: 7.33 (ref 7.32–7.40)
pO2, VEN: 26 mmHg — ABNORMAL LOW (ref 35–40)

## 2020-11-01 LAB — FIBRINOGEN: Fibrinogen: 369 mg/dL (ref 150–450)

## 2020-11-01 LAB — MAGNESIUM
Magnesium: 2.5 mg/dL — ABNORMAL HIGH (ref 1.8–2.4)
Magnesium: 2.3 mg/dL (ref 1.8–2.4)
Magnesium: 2.2 mg/dL (ref 1.8–2.4)

## 2020-11-01 LAB — BETA HYDROXYBUTYRATE: Beta Hydroxybutyrate: 0.84 mmol/L — ABNORMAL HIGH (ref <0.30)

## 2020-11-01 LAB — L LACTATE, VENOUS WB (TO U/H LAB WITHIN 30 MIN): L Lactate (Direct), Venous Whole Blood: 5 mmol/L — ABNORMAL HIGH (ref 0.6–1.9)

## 2020-11-01 LAB — GLUCOSE, WHOLE BLOOD: Glucose: 694 mg/dL (ref 62–125)

## 2020-11-01 MED ORDER — ALTEPLASE 50 MG IN 50 ML INFUSION (PE) (USING 50 MG VIAL)
50.0000 mg | Freq: Once | INTRAVENOUS | Status: AC
Start: 2020-11-01 — End: 2020-11-01
  Administered 2020-11-01: 50 mg via INTRAVENOUS
  Filled 2020-11-01: qty 50

## 2020-11-01 MED ORDER — HEPARIN IV BOLUS FROM PUMP
5000.0000 [IU] | Freq: Once | Status: AC
Start: 2020-11-01 — End: 2020-11-01
  Administered 2020-11-01: 5000 [IU] via INTRAVENOUS

## 2020-11-01 MED ORDER — SODIUM CHLORIDE (PF) 0.9 % IJ SOLN
1.0000 mL | Freq: Once | INTRAVENOUS | Status: AC | PRN
Start: 2020-11-01 — End: 2020-11-04
  Administered 2020-11-01: 4 mL via INTRAVENOUS

## 2020-11-01 MED ORDER — LACTATED RINGERS BOLUS
500.0000 mL | Freq: Once | INTRAVENOUS | Status: AC
Start: 2020-11-01 — End: 2020-11-01
  Administered 2020-11-01: 500 mL via INTRAVENOUS

## 2020-11-01 MED ORDER — INSULIN REGULAR 100 UNITS IN NS 100 ML INFUSION (PKG PREMIX)
0.0000 [IU]/h | INJECTION | Status: DC
Start: 2020-11-01 — End: 2020-11-03
  Administered 2020-11-01: 5 [IU]/h via INTRAVENOUS
  Administered 2020-11-02: 1.5 [IU]/h via INTRAVENOUS
  Filled 2020-11-01 (×2): qty 100

## 2020-11-01 MED ORDER — LACTATED RINGERS BOLUS
1000.0000 mL | Freq: Once | INTRAVENOUS | Status: AC
Start: 2020-11-01 — End: 2020-11-01
  Administered 2020-11-01: 1000 mL via INTRAVENOUS

## 2020-11-01 MED ORDER — DEXTROSE 10% IV SOLUTION BOLUS
125.0000 mL | Status: DC | PRN
Start: 2020-11-01 — End: 2020-11-02

## 2020-11-01 MED ORDER — HEPARIN SOD (PORCINE) IN D5W 100 UNIT/ML IV SOLN
0.0000 [IU]/kg/h | INTRAVENOUS | Status: DC
Start: 2020-11-01 — End: 2020-11-01
  Administered 2020-11-01: 12 [IU]/kg/h via INTRAVENOUS
  Filled 2020-11-01: qty 250

## 2020-11-01 MED ORDER — ATORVASTATIN CALCIUM 80 MG OR TABS
80.0000 mg | ORAL_TABLET | Freq: Every day | ORAL | Status: DC
Start: 2020-11-01 — End: 2020-11-02
  Administered 2020-11-01 – 2020-11-02 (×2): 80 mg via ORAL
  Filled 2020-11-01 (×2): qty 1

## 2020-11-01 MED ORDER — GLUCAGON HCL (RDNA) 1 MG IJ SOLR
1.0000 mg | INTRAMUSCULAR | Status: DC | PRN
Start: 2020-11-01 — End: 2020-11-02

## 2020-11-01 MED ORDER — IOHEXOL 350 MG/ML IV SOLN
100.0000 mL | Freq: Once | INTRAVENOUS | Status: AC | PRN
Start: 2020-11-01 — End: 2020-11-01
  Administered 2020-11-01: 100 mL via INTRAVENOUS

## 2020-11-01 MED ORDER — ASPIRIN 81 MG OR TBEC
81.0000 mg | DELAYED_RELEASE_TABLET | Freq: Every day | ORAL | Status: DC
Start: 2020-11-02 — End: 2020-11-01

## 2020-11-01 MED ORDER — GLUCAGON HCL (RDNA) 1 MG IJ SOLR
0.5000 mg | INTRAMUSCULAR | Status: DC | PRN
Start: 2020-11-01 — End: 2020-11-02

## 2020-11-01 NOTE — H&P (Signed)
History and Physical - Critical Care     Nathan [name] Nathan [name]") - DOB: 1951/01/06 (70 year old male)  Preferred Pronouns: he/him/his  PCP: [name], MD   Code Status: No Order       CHIEF CONCERN / IDENTIFICATION:  Nathan [name] is a 70 year old male with T2DM who was brought in by EMS after a fall at home.      SUBJECTIVE   HISTORY OF PRESENT ILLNESS:   [name] says that he has had a dry mouth, white tongue, and blurry vision for a couple of months. He had been trying to drink lots of juice to help with his dry mouth but he was unable to keep up.     [name] says that this morning he was profoundly thirsty and tried drinking juice to make it better. He walked to the bathroom and then fell, he isn't sure if he lost consciousness but he ended up on the floor. He called 9-1-1 at that point. He was able to get himself up from the floor after falling to let EMS into his apartment. EMS found him to be tachypneic to the 40s and placed him on a non-rebreather.     On arrival to the [facility] ER his initial vitals were HR 107, BP 76/65, RR 28, SpO2 98% on 15L oxygen. His initial labs showed a glucose of >600, BMP with AGAP of 19, lactate of 8.3, osm 330, HBH+, initial troponin of 0.17. His ECG showed a bifascicular block, no T wave inversions or ST changes.      Review of Systems  he denied any preceding viral illness, fevers, chills, dysuria. He has felt dizzy recently but he denies headaches. Denies CP. Endorsed abdominal queasiness, denies pain but states could possibly be pressure. Did have SOB when found by EMS    HISTORY   Problem List   Diagnosis    Obesity, unspecified    Unspecified asthma, with status asthmaticus    Generalized osteoarthrosis, unspecified site    Other hyperlipidemia    Presbyopia    Gout    Irregular heart beat    Achilles tendinosis of right lower extremity    Mild intermittent asthma without complication    Healthcare maintenance    Non-seasonal allergic rhinitis        Past Surgical History:   Procedure Laterality Date    UNLISTED PROCEDURE LEG/ANKLE         Social History     Tobacco Use    Smoking status: Former Smoker     Start date: 07/21/1961     Quit date: 07/22/1971     Years since quitting: 49.3    Smokeless tobacco: Never Used   Substance and Sexual Activity    Alcohol use: No    Drug use: No    Sexual activity: Not on file   Social History Narrative    Originally from [place]. Lives by himself in apartment with section 8. Has brother in [place] and one in [place]. 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 Likes to go fishing, reads the bible.         Tobacco: smoked age [age] approx 1 pack a week. Stopped now.     Alcohol: Denies. Remote drinking in last teens     Substances: Denies        Family History   Problem  Relation Age of Onset          OUTPATIENT MEDICATIONS:   Current Outpatient Medications   Medication Instructions    acetaminophen (TYLENOL) 325 mg, Oral, Every 6 hours PRN    Albuterol Sulfate HFA 108 (90 Base) MCG/ACT Inhalation Aero Soln 2 puffs, Inhalation, Every 6 hours PRN    allopurinol (ZYLOPRIM) 200 mg, Oral, Daily    atorvastatin 40 MG tablet Take 1 tablet (40 mg) by mouth every evening. To lower cholesterol. (past due for annual exam)    colchicine 0.6 MG tablet Take 2 tablets by mouth at first sign of flare followed by 1 table 1 hour later    Fluticasone Propionate HFA 110 MCG/ACT Inhalation Aerosol 1 puff, Inhalation, Every 12 hours    ketoconazole 2 % shampoo Apply to face and scalp as needed    loratadine (CLARITIN) 10 mg, Oral, Daily PRN, (past due for annual exam/follow up appointment)    Triamcinolone Acetonide 0.1 % External Ointment Neck, 2 times daily       ALLERGIES:   Patient has no known allergies.        OBJECTIVE     Vitals (Arrival)      T: 36 C (11/01/20 1212)  BP: (!) 76/65 (11/01/20 1212)  HR: (!) 107 (11/01/20 1212)  RR: (!)  28 (11/01/20 1212)  SpO2: 98 % (11/01/20 1212) Nasal cannula 15 L/min     Vitals (Most recent in last 24 hrs)   T: 36 C (11/01/20 1212)  BP: (!) 111/98 (11/01/20 1500)  HR: (!) 101 (11/01/20 1500)  RR: 17 (11/01/20 1500)  SpO2: 91 % (11/01/20 1500) Nasal cannula 6 L/min    T range: Temp  Min: 36 C  Max: 36 C  (no weight taken for this visit)     (no height taken for this visit)     There is no height or weight on file to calculate BMI.       Date 11/01/20 0700 - 11/02/20 0659   Shift 0700-1459 1500-2259 2300-0659 24 Hour Total   INTAKE   IV Piggyback 500   500   Shift Total 500   500   OUTPUT   Shift Total       NET 500   500   Weight (kg)           Physical Exam  Gen: well developed well nourished, no acute distress, constantly licking lips   Neuro: Alert and oriented, moving all extremities symmetrically and equally, no dysarthria or facial droop  HEENT: no scleral icterus, PERRL, dry mucous membranes  Psych: mood normal, affect reactive  Pulm: clear to auscultation anteriorly, no increased work of breathing, speaking in full sentences  CV: tachycardic, rhythm regular, S1 S2, no rub no murmur, no edema  Abdo: non tender, non distended.    Skin: No visible lesion on exposed skin    Labs (last 24 hours):   Chemistries  CBC  LFT  Gases, other   133 95 30 579   16.4   AST: - ALT: -  -/-/-/-   7.33/47/26/24   4.7 25 2.05   7.63 >< 172  AP: - T bili: -  Lact (a): - Lact (v): 5.0   eGFR: 32 Ca: 9.3   47   Prot: - Alb: -  Trop I: 0.17 D-dimer: -   Mg: 2.5 PO4: 5.1  ANC: -     BNP: 29 Anti-Xa: -     ALC: -  INR: 1.1      TEE 4/14  Conclusion  1. The left ventricle is normal in size. There is normal left ventricular  wall thickness. Global left ventricular function is normal (biplane EF 68%).  No regional wall motion abnormalities are present.  2. The right ventricle is severely dilated. The right ventricular systolic  function is severely decreased. Right ventricular segmental wall motion  abnormalities are present  with decreased function of the mid segment. The  right atrium is mildly dilated.  3. Normal valve structure and function.  4. The pulmonary artery systolic pressure is borderline elevated at 32 mmHg.  5, There is no pericardial effusion.    Data Review:      Reviewed Results? Independently visualized & interpreted? Key Findings     Lab '[x]'  '[x]'  BG 800s, normal potassium   Radiology '[x]'  '[x]'  CXR clear    EKG/Tele/Echo  '[x]'  '[x]'  RBBB, no ST changes or T waves inversions; echo with RV dysfunction and wall motion abnormalities   Other?  '[]'  '[]'           ASSESSMENT/PLAN        #HHS    #T2DM (A1c 7.5% on 12/26/19)  Presented with BG 800s, beta-hydroxybutarate < 3 consistent with HHS. 2.5L bolus fluids in ED given unknown EF. K normal at 5.1. Initial pH 7.21, bicarb 61 -> improved to pH 7.40, bicarb 38. In terms of etiology likely T2DM off medications. No localizing signs infection. Will continue aggressive fluid resuscitation given EF 68%.   - Repeat 1L bolus for 30cc/kg total fluids, then reassess  - Continue insulin gtt  - HbA1c with am labs  - Supp K > 4    #AGMA  #Lactic Acidosis   #Metabolic alkalosis   Anion gap 14, bicarb 20, delta delta of 3 suggesting AGMA and metabolic alkalosis. AGMA more likely lactic acidosis given lactate 8.3 on admission, less likely ketoacidosis given beta-hydroxybutarate < 3. Lactic acidosis likely due to severe hypovolemia but will obtain blood cx's to rule out sepsis. Metabolic alkalosis likely contraction alkalosis given dehydration.   - Fluid resuscitation as above  - Lactate trend 8.3 -> 5.0 -> 3.1  - Daily BMP  - Blood cx's    #NSTEMI  #RV dysfunction  Troponin trend 0.17 -> 1.61. EKG with new bifascicular block, no T wave inversions or ST changes. TTE with severely dilated RV, wall motion abnormalities in RV and RV dysfunction c/f type I NSTEMI vs. PE; EF 68%. Denies CP although did have SOB and abdominal pressure.   - Stat CT PE  - Cards consult for LHC within 24h  - Start heparin gtt  -  Atorvastatin 37m daily  - Lipid panel with am labs  - S/p ASA load 3273mon 4/14  - Hold beta blocker, he is preload dependent    #Syncope  Most likely 2/2 severe orthostasis given prodrome of dizziness, severe dehydration, and pre-load dependent physiology due to RV dysfunction and likely inferior MI vs PE as above. Monitoring on tele for arrythmia. Neurologic exam grossly normal thus low c/f stroke, less likely seizure given no lateral tongue bite.  -Fluid resuscitation as above  -Telemetry    #AKI  Likely prerenal. Baseline 1.23 in 12/2019, up to 2.38 on admission, down-trending.  - Daily BMP    ICU Checklist:    COVID-19 surveillance: neg 4/14  Delirium prevention/therapy: ICU protocol  Fluids/electrolytes: replete prn  Diet: No diet orders on file  Minimum mobility goal: ICU protocol  DVT  Prophylaxis: heparin gtt  GI Prophylaxis: none indicated at this time  Glucose control: insulin gtt  Lines/Drains/Airways: 2 x PIV's  Labs: trend tropoin/lactate, daily CBC/BMP  Antibiotic stewardship: none  Disposition: MICU  Code Status: No Order    Contacts: Primary Emergency Contact: [place], [place], Home Phone: [phone]  Next of Kin: Gustavo Lah

## 2020-11-01 NOTE — ED Notes (Signed)
Bed: BLUi  Expected date:   Expected time:   Means of arrival:   Comments:  MEDICS-SOB, on Non-breather     Mable Fill, RN  11/01/20 1211

## 2020-11-01 NOTE — ED Provider Notes (Signed)
EMERGENCY DEPARTMENT ENCOUNTER     PATIENT IDENTIFIERS  PATIENT NAME: Nathan [name]  MRN: [id number]    [place] COMPLAINT    Chief Complaint   Patient presents with    Shortness of Breath       HISTORY OF PRESENT ILLNESS  Nathan [name] is a 70 year old man with hyperlipidemia, asthma, prediabetes (but last HbA1c 7.5 in June 2021), BIBA EMS for syncope and hypoxia    Nathan [name] says that this morning he felt lightheaded and fell to the ground.  Afterwards he felt weak and had trouble getting up so called EMS.  But after a while was able to get up, and open the door for EMS.  Per EMS, he was hypoxemic with sats in the 80s on 4 L nasal cannula so was switched to nonrebreather mask.  POC glucose 497.  EKG with frequent PVCs.  He was given ASA 324 and 200 cc LR.    He arrives to the ED with nonrebreather satting greater than 95%.  He corroborates history and states that other than his lightheadedness this morning, he is not feeling any chest pain, belly pain, fevers or chills, burning with urination.  He does state he has a history of asthma but infrequently uses his inhalers.  Otherwise he takes his atorvastatin and his loratadine.  Last saw his PCP in June.  Does not report a history of diabetes.  He reports that over the past 2 months, he has been drinking a lot of juice.  He used to drink milk, but noticed that it turned his tongue white, so switched over to drinking juice almost exclusively.  He does  endorse more frequent thirst and urination in the past week or so but cannot give a good timeline.     Point-of-care check here was read as high (aka over 600).  Point-of-care bedside ultrasound showed no pericardial effusion, left-sided systolic function did not appear robust, and the right-sided heart appeared dilated.  We were able to wean him off his nonrebreather to 6 L nasal cannula with sats above 95.  Stat VBG showed lactate of 8.8.  Given  high blood sugars and lactate he was started on insulin and DKA protocol.     PAST MEDICAL HISTORY   Past Medical History:   Diagnosis Date    Obesity, unspecified 11/24/2008    Unspecified asthma, with status asthmaticus 11/24/2008    Generalized osteoarthrosis, unspecified site 11/24/2008    Other and unspecified hyperlipidemia 09/22/3297    Urinary complications 08/24/2681       CURRENT OUTPATIENT MEDICATIONS    Current Outpatient Medications   Medication Instructions    acetaminophen (TYLENOL) 325 mg, Oral, Every 6 hours PRN    Albuterol Sulfate HFA 108 (90 Base) MCG/ACT Inhalation Aero Soln 2 puffs, Inhalation, Every 6 hours PRN    allopurinol (ZYLOPRIM) 200 mg, Oral, Daily    atorvastatin 40 MG tablet Take 1 tablet (40 mg) by mouth every evening. To lower cholesterol. (past due for annual exam)    colchicine 0.6 MG tablet Take 2 tablets by mouth at first sign of flare followed by 1 table 1 hour later    Fluticasone Propionate HFA 110 MCG/ACT Inhalation Aerosol 1 puff, Inhalation, Every 12 hours    ketoconazole 2 % shampoo Apply to face and scalp as needed    loratadine (CLARITIN) 10 mg, Oral, Daily PRN, (past due for annual exam/follow up appointment)    Triamcinolone Acetonide 0.1 % External Ointment Neck, 2  times daily       ALLERGIES  Review of patient's allergies indicates:  No Known Allergies    SOCIAL HISTORY  Social History     Tobacco Use    Smoking status: Former Smoker     Start date: 07/21/1961     Quit date: 07/22/1971     Years since quitting: 49.3    Smokeless tobacco: Never Used   Substance Use Topics    Alcohol use: No    Drug use: No       FAMILY HISTORY  Family History        Negative family history of: Blindness, Cataracts, Glaucoma, Macular Degeneration, Retinal Detachment           REVIEW OF SYSTEMS  10/14 systems reviewed and pertinent positives stated in HPI, otherwise negative (included systems: constitutional, HENT, eyes, CV, respiratory, GI, GU, MSK, neuro, skin)    PHYSICAL EXAM    Vital signs on arrival: Temp: 36 C Pulse: (!) 107 Resp: (!) 28 SpO2: 98 % BP: (!) 76/65    Const: adult man, sitting upright with NRB mask on  Eyes: no scleral icterus, no conjunctival injection  HENT: Normocephalic, atraumatic  CV: tachycardic, regular rate, distant heart sounds, distal extremities cool  RESP: breath sounds present bilaterally, expiratory wheezing speaking in full sentences,   GI: soft, non-tender, non-distended, no rebound or guarding  MSK: No gross deformities appreciated, mild nonpitting edema in bilateral extremities  Skin: cool extremities. No rashes  Neuro: A&Ox3, clear speech, following commands, moving all extremities, answering questions appropriately  Psych: appropriate mood and affect    EKG  My interpretation is as follows:   Rate: 107   Rhythm:Rhythm: Sinus tachycardia  Axis:Axis Interpretation: Normal  ST Changes:ST Changes: None  Ectopy:Ectopy: None  Intervals:Intervals: Abnormal long QT (QTc 699)  Notes: RBBB, LAFB    LABS  Labs Reviewed   BASIC METABOLIC PANEL - Abnormal       Result Value    Sodium 131 (*)     Potassium 5.1      Chloride 91 (*)     Carbon Dioxide, Total 21 (*)     Anion Gap 19 (*)     Glucose 841 (*)     Urea Nitrogen 31 (*)     Creatinine 2.38 (*)     Calcium 9.5      eGFR by CKD-EPI 27 (*)    TROPONIN_I - Abnormal    Troponin_I 0.17 (*)     Troponin_I Interpretation        Value: Elevated above the 99th percentile of a healthy population   MAGNESIUM - Abnormal    Magnesium 2.5 (*)    BLOOD GAS PANEL 8, VE - Abnormal    Specimen Description Venous      pH, VEN 7.21 (*)     pCO2, VEN 61 (*)     pO2, VEN 23 (*)     Bicarbonate, VEN 23      O2 Saturation, VEN Calculated O2 SAT not reported.      Base Excess, Blood, VEN Test Not Required      Base Deficit Blood, VEN 5.8 (*)     Hemoglobin 16.4      Sodium 134 (*)     Potassium 5.2      Calcium (Ionized) 1.24      Glucose 800 (*)     L Lactate (Direct), Venous Whole Blood 8.3 (*)    BASIC METABOLIC PANEL - Abnormal  Sodium 133 (*)     Potassium 4.7      Chloride 95 (*)     Carbon Dioxide, Total 25      Anion Gap 13 (*)     Glucose 689 (*)     Urea Nitrogen 30 (*)     Creatinine 2.05 (*)     Calcium 9.3      eGFR by CKD-EPI 32 (*)    BLOOD GAS, VEN - Abnormal    Specimen Description Venous      pH, VEN 7.33      pCO2, VEN 47      pO2, VEN 26 (*)     Bicarbonate, VEN 24      O2 Saturation, VEN Calculated O2 SAT not reported.      Base Excess, Blood, VEN Test Not Required      Base Deficit Blood, VEN 2.2 (*)    OSMOLALITY - Abnormal    Osmolality 330 (*)    PHOSPHATE - Abnormal    Phosphate 5.1 (*)    BETA HYDROXYBUTYRATE - Abnormal    Beta Hydroxybutyrate 0.84 (*)    GLUCOSE, WHOLE BLOOD - Abnormal    Glucose 694 (*)    L LACTATE, VENOUS WB (TO U/H LAB WITHIN 30 MIN) - Abnormal    L Lactate (Direct), Venous Whole Blood 5.0 (*)    GLUCOSE POC, HMC - Abnormal    Glucose (POC) >600 (*)    GLUCOSE POC, [facility]MC - Abnormal    Glucose (POC) >600 (*)    GLUCOSE POC, [facility] - Abnormal    Glucose (POC) >600 (*)    CBC (HEMOGRAM)    WBC 7.63      RBC 5.38      Hemoglobin 15.8      Hematocrit 47      MCV 88      MCH 29.4      MCHC 33.5      Platelet Count 172      RDW-CV 12.9     PROTHROMBIN TIME    Prothrombin Time Patient 13.8      Prothrombin INR 1.1     PARTIAL THROMBOPLASTIN TIME    Partial Thromboplastin Time 26      Partial Thromboplastin X Mean        Value: To calculate the PTT X Mean divide PTT value by 29.   B_TYPE NATRIURETIC PEPTIDE    B_Type Natriuretic Peptide 29     SARS-COV-2 (COVID-19) QUALITATIVE RAPID PCR    COVID-19 Coronavirus Qual PCR Specimen Type Nasal swab      COVID-19 Coronavirus Qual PCR Result None detected      COVID-19 Coronavirus Qual PCR Interpretation        Value: This is a negative result. Laboratory testing alone cannot rule out infection, particularly in the presence of clinical risk factors such as symptoms or exposure history.    COVID-19 Qualitative PCR Indication Symptomatic          IMAGING  TransTHORACIC echo (TTE) complete   Final Result      XR Chest 1 Vw   Final Result      Lungs: Clear.      Pleura: No effusion. No pneumothorax.      Heart and mediastinum: Unremarkable.      Bones: No acute or suspicious abnormality.         ED POCUS - Cardiac Ultrasound and Volume Assessment    (Results Pending)       MEDICAL  DECISION MAKING    Nathan [name] is a 70 year old man with hyperlipidemia, asthma, prediabetes (but last HbA1c 7.5 in June 2021), BIBA EMS for syncope and hypoxia    Patient presents with elevated lactate, pH 7.2, blood glucose in the 800s, anion gap of 19.  These are all consistent with DKA and he was treated with insulin and fluids.  Given unknown cardiac history and POCUS suggesting reduced LV function, we were initially judicious with fluids.  Chest x-ray came back clear.  Initial troponin elevated to 0.17.     He was initially hypoxic with EMS, but weaned down to RA while in the ED with sats 95.  Unclear what caused his initial hypoxemia.  However, given elevated troponin, syncopal event, concern about ACS versus pulmonary embolus.  Disease versus occult infection we are not picking up could have been a trigger for DKA.     After getting insulin, his repeat ABGs improved. PH 7.4, lactate 3.1.  TTE was ordered to assess cardiac structure and function.  Repeat troponin came back elevated above 1, so patient was started on heparin drip.      MICU has admitted the patient.    Medications given in the ED:  Medications   insulin REGULAR 100 units in NS 100 mL (1 unit/mL) infusion (pkg premix) (3 Units/hr Intravenous Dose/Rate Changed 11/01/20 1448)   dextrose 10% IV Bolus 125 mL (has no administration in time range)   glucagon injection 0.5 mg (has no administration in time range)   glucagon injection 1 mg (has no administration in time range)   perflutren lipid microspheres (Definity) 1.3 mL in sodium chloride (PF) 0.9 % 8.7 mL injection (4 mL Intravenous Given 11/01/20  1606)   lactated ringers IV Bolus 1,000 mL (1,000 mL Intravenous New Bag 11/01/20 1622)   heparin 25,000 units in D5W 250 mL (100 units/mL) infusion (premix) (has no administration in time range)   heparin Bolus from pump 5,000 units (has no administration in time range)   atorvastatin (Lipitor) tablet 80 mg (has no administration in time range)   lactated ringers IV Bolus 500 mL (0 mL Intravenous Stopped 11/01/20 1430)   lactated ringers IV Bolus 1,000 mL (0 mL Intravenous Stopped 11/01/20 1622)       Patient was given scripts for the following medications:  New Prescriptions    No medications on file       FINAL IMPRESSION   (E13.10) Diabetic ketoacidosis without coma associated with other specified diabetes mellitus ([place])  (primary encounter diagnosis)    (R77.8) Elevated troponin       DISPOSITION  Admit       Warden Fillers, MD  Resident  11/01/20 [phone]

## 2020-11-01 NOTE — Progress Notes (Signed)
ED Social Work Assessment    ID / CC / [name] for Referral: 70 year old male with T2DM who was brought in by EMS after a fall at home. Registration referred to SW due to visitor presence in the waiting room.       Identifying data/[name] for referral:  Referral Source: Other (Comment) (ED registration)  Referral [name]: Counseling/support    Social Work Summary:  HPI: SW is informed by ED registration that pt's brothers are in the [place].   SW checks in with pt and pt's RN to confirm visitation is OK.  SW greets pt's brothers in the [place], obtains contact info and informs them of visitor policy in the ED and once admitted. SW escorts them to pt's bedside.       Healthcare Decision Making Information:   Contact in Following Order for Legal Next of Kin Decision Making:  1. Patient as able  2. Name, Relationship, Contact Number  Other Family/Friends/Contact:  Nathan [name], pt's brother, [phone]  Nathan [name], pt's brother, [phone]    Language: Cleophus Molt  Interpreter: No     Impression: Pt awake alert and able to makes needs known. Supported by his brothers at bedside.   Plan: Pt to be admitted to IP.         Geraldo Docker, LICSW   Emergency Services Social Worker  [employer]  [phone]  SW Emergency department services (minutes): 35

## 2020-11-01 NOTE — ED Notes (Signed)
Bed: BLU1  Expected date:   Expected time:   Means of arrival:   Comments:  Hold for blue iso       Loura Back, RN  11/01/20 1421

## 2020-11-01 NOTE — Consults (Signed)
Consult Note     Nathan [name]") - DOB: 11-30-1950 [id number] year old male)  Preferred Pronouns: he/him/his  Admit Date: 11/01/2020  Code Status: No Order         Consults    CHIEF CONCERN / IDENTIFICATION:  Nathan [name] is a 70 year old male with pmhx of DM2, asthma, gout that is coming in for shortness of breath. Cards was consulted for rising trop and echo showing severely dilated RV and reduced RV function w/ normal LV function.      SUBJECTIVE   HISTORY OF PRESENT ILLNESS:   Paitent states that he over the last couple of weeks he has been feeling more dehydrated.  States that it starts with a weird sensation on his tongue with white plaque on it.  Feel that he is not eating or drinking as much because of change in taste.  States that today he was walking to and from the kitchen when all sudden he collapsed to the ground.  States that he fell forward, unsure if he lost consciousness but thinks that he might have for a very brief moment of time.  Most recently  thing that he remembers upon waking up is that he was confused about why he was on the ground and feeling very short of breath.  States that he has not felt short of breath like this previously in addition had some lightheadedness but denies any palpitations, chest pain, orthopnea or PND.  States that he is lost 5 to 10 pounds over the last 3 weeks.  Peeing more frequently, small volumes but thinks overall volume is roughly been about the same.  No swelling in his legs.  States that at present time he feels much better feeling roughly about 80% back to his baseline.  He has never had any chest pain throughout this.  No history of myocardial infarction.  Has not had syncopal episodes prior to this.    At present time he feels no longer dizzy or short of breath.  Still feels that he is wheezing slightly but says that this is not unusual for him.  Uses an albuterol occasionally at home.    In the emergency room he initially had a heart rate of  107, BP 76/65, respiratory rate 28, SPO2 98% initially on 15 L O2.  He was found to be in HHS.  Initial troponin was 0.17 that is increased to 1.6.  EKG showed new by fascicular block with some ST depressions laterally.  Echo was completed in the ER that showedNormal LV size and function but has RV dilation that is severe and RV systolic dysfunction that is severely decreased.  Systolic pulmonary systolic pressure is mildly elevated at 32.    Social history: Lives alone by self in apartment.  Here in [place].  Tobacco smoked 1 cigarette a day for 10 years.  Quit at the age of 33.  No current alcohol use or other drugs.  Family history: Denies family history of myocardial infarction or blood clots.  No known drug allergies    Review of Systems  I have performed a complete review of systems with the patient, which is negative except as indicated per HPI.    MEDICATIONS:   SCHEDULED MEDICATIONS:     [START ON 11/02/2020] aspirin, 81 mg, Daily    atorvastatin, 80 mg, Daily    INFUSED MEDICATIONS:  heparin, 0-35 Units/kg/hr (Order-Specific), Continuous, Last Rate: 12 Units/kg/hr (11/01/20 1753)    insulin REGULAR, 0-19  Units/hr, Continuous, Last Rate: 3 Units/hr (11/01/20 1448)     PRN MEDICATIONS:  dextrose, 125 mL, PRN    glucagon, 0.5 mg, PRN    glucagon, 1 mg, PRN    perflutren lipid microsphere (Definity) in NS injection, 1-10 mL, Once PRN      ALLERGIES:   Patient has no known allergies.     OBJECTIVE     Vitals (Arrival)      T: 36 C (11/01/20 1212)  BP: (!) 76/65 (11/01/20 1212)  HR: (!) 107 (11/01/20 1212)  RR: (!) 28 (11/01/20 1212)  SpO2: 98 % (11/01/20 1212) Nasal cannula 15 L/min     Vitals (Most recent in last 24 hrs)   T: 36 C (11/01/20 1212)  BP: 101/73 (11/01/20 1800)  HR: 91 (11/01/20 1800)  RR: 17 (11/01/20 1800)  SpO2: 92 % (11/01/20 1800) Nasal cannula 6 L/min    T range: Temp  Min: 36 C  Max: 36 C  (no weight taken for this visit)     Ht '5\' 6"'  (1.676 m)     Body mass index is 42.93 kg/m.        Physical Exam  Vitals:    11/01/20 1800   Temp:    Pulse: 91   BP: 101/73   Resp: 17   SpO2: 92%   Height:      Gen: well developed well nourished, no acute distress, looks comfortable  Neuro: Alert and oriented, moving all extremities symmetrically and equally, no dysarthria or facial droop  Psych: mood normal, affect reactive  Pulm: no increased work of breathing, speaking in full sentences.  Faint wheezes in the bases bilaterally.  CV: : Mildly tachycardic, rhythm regular, S1 S2, no rub no murmur, no edema  Back: no spinous process tenderness, no CVA tenderness  Abdo: non tender, non distended.  Obese abdomen.  Skin: No visible lesion on exposed skin      Labs (last 24 hours):   Chemistries  CBC  LFT  Gases, other   134; 136 100 29 500   16.2   AST: - ALT: -  -/-/-/-   7.40/38/51/23   4.9; 4.9 20 1.90   7.63 >< 172  AP: - T bili: -  Lact (a): - Lact (v): 3.1   eGFR: 35 Ca: 9.1   47   Prot: - Alb: -  Trop I: 1.61 D-dimer: -   Mg: 2.3 PO4: 5.1  ANC: -     BNP: 29 Anti-Xa: -     ALC: -    INR: 1.1      #XR Chest 1 Vw  Lungs: Clear. Pleura: No effusion. No pneumothorax. Heart and mediastinum: Unremarkable. Bones: No acute or suspicious abnormality.     #CT chest   Large pulmonary arterial embolic burden involving all lobar arteries of the bilateral lungs.    The RV/LV ratio is greater than 1, raising the possibility of right ventricular pressure overload. Additionally, there is flattening of the intraventricular septum with mild septal bowing.     Indications were communicated telephonically to Dr. Augustin Coupe by Dr. Donald Pore at [facility] on 11/01/2020.     ASSESSMENT/PLAN      Patient is a 69 year old with past medical history of diabetes, asthma, gout who is being admitted to the MICU for hypotension and respiratory failure found to have troponin elevation of 0.17-->1.6 found to have new severe RV dilation and systolic dysfunction found to have large PE. Also considered primary ACS however patient is not  in any chest  pain, likely location the right sided or posterior however EKG is not presently suggestive of ACS in such anatomic distribution. Patient does have risk factors for ACS including diabetes, obesity, and tobacco history. Given that he has large PE with RV dysfunction I suspect his troponin is secondary to PE burden, thus defer management to primary MICU team.   - Agree with aspirin load, continue 81 aspirin daily  - Would continue heparin overnight  - Recommend lipids for AM labs for risk stratification    This plan was discussed with Dr. Bridgett Larsson via phone.

## 2020-11-01 NOTE — ED Triage Notes (Signed)
[facility] had syncopal event today @ 1100, unable to get off the ground for awhile. Medics found pt to have resp rate of 40, 83% on 4 NC, felt better on NRB, BG 497. Denies PMG except high cholestrol. Abnormal EKG in field. Pt sent directly to ED B, on COVID precautions. ASA 3214 given PTA, no NTG given due to hypotension

## 2020-11-01 NOTE — Progress Notes (Signed)
Progress Note   Nathan [name]") - DOB: 10-Aug-1950 70 year old male)  Preferred Pronouns: he/him/his  Admit Date: 11/01/2020  Code Status: No Order       CHIEF CONCERN / IDENTIFICATION:  Nathan [name] is a 70 year old male with T2DM admitted for syncope found to have HHS, NSTEMI, and submassive bilateral PE.      SUBJECTIVE   INTERVAL HISTORY:  - Admitted to MICU   - CTPE with bilateral PE, signs of right heart strain, RV failure on TTE, troponin peaked at 2.1  - Treated with 0.3m/kg alteplase, hep gtt stopped before  - BP 80/50 roughly 20 minutes into tpa infusion, cool extremities on exam, given another L of fluid and labs drawn which showed Cr improving, lactate downtrending to 2.5 (further down to 1.8 this AM). Mentating well throughout  -Heparin gtt started after tpa, therapeutic this AM at 0.79  -Gap closed, blood glucose in 150-200 range this AM  -1L of UOP overnight  -This morning he is feeling well, dry mouth has improved, denies chest pain or dyspnea. He has questions about what his diet should be when he leaves the hospital     Review of Systems  Full ROS obtained, negative aside from mentioned in HPI       OBJECTIVE     Vitals (Most recent in last 24 hrs)     T: 36 C (11/01/20 1212)  BP: 101/73 (11/01/20 1800)  HR: 91 (11/01/20 1800)  RR: 17 (11/01/20 1800)  SpO2: 92 % (11/01/20 1800) Nasal cannula 6 L/min    T range: Temp  Min: 36 C  Max: 36 C  Admit weight: (not recorded)  Last weight: (not recorded)       I&Os:     Intake/Output Summary (Last 24 hours) at 11/01/2020 1913  Last data filed at 11/01/2020 1622  Intake 1500 ml   Output --   Net 1500 ml     Respiratory Data:  Resp: 17 (04/14 1800)  SpO2: 92 % (04/14 1800)  Pulse Oximetry Type: Continuous (04/14 1500)  Oxygen Therapy: Supplemental oxygen (04/14 1500)  O2 Delivery Method: Nasal cannula (04/14 1500)  O2 Flow Rate (L/min): 6 L/min (04/14 1500)    Physical Exam  Gen: well developed well nourished, no acute distress   Neuro:  Alert and oriented, moving all extremities symmetrically and equally, no dysarthria or facial droop  HEENT: no scleral icterus, PERRL, dry mucous membranes  Psych: mood normal, affect reactive  Pulm: clear to auscultation anteriorly, no increased work of breathing, speaking in full sentences  CV: rate in 70s, rachycardic S1 S2, no rub no murmur, no edema  Abdo: non tender, non distended.    Skin: No visible lesion on exposed skin, extremities wwp    Labs (last 24 hours):   Chemistries  CBC  LFT  Gases, other   134; 136 100 29 577   16.2   AST: - ALT: -  -/-/-/-   7.40/38/51/23   4.9; 4.9 20 1.90   7.63 >< 172  AP: - T bili: -  Lact (a): - Lact (v): 3.1   eGFR: 35 Ca: 9.1   47   Prot: - Alb: -  Trop I: 1.61 D-dimer: -   Mg: 2.3 PO4: 5.1  ANC: -     BNP: 29 Anti-Xa: -     ALC: -    INR: 1.1        Data Review:  Reviewed Results? Independently visualized & interpreted? Key Findings     Lab [x] [x] Troponin peaked at 2.1, lactate down to 1.8 from 8, K 3.4, Cr 1.6 on AM BMP, AGAP normal   Radiology [x] [x] CTA w/ PE bilateral lobar PE, reviewed on imaging rounds   EKG/Tele/Echo  [x] [x] TTE with RV dilation and severe dysfunction   Other? Micro [x] [] BCx NGTD      Imaging:  CT-PE 04/14  Large pulmonary arterial embolic burden involving all lobar arteries of the bilateral lungs.     The RV/LV ratio is greater than 1, raising the possibility of right ventricular pressure overload. Additionally, there is flattening of the intraventricular septum with mild septal bowing.     Main pulmonary artery measures 3.3 cm suggestive of pulmonary hypertension.    TTE 04/14  1. The left ventricle is normal in size. There is normal left ventricular  wall thickness. Global left ventricular function is normal (biplane EF 68%).  No regional wall motion abnormalities are present.  2. The right ventricle is severely dilated. The right ventricular systolic  function is severely decreased. Right ventricular segmental wall  motion  abnormalities are present with decreased function of the mid segment. The  right atrium is mildly dilated.  3. Normal valve structure and function.  4. The pulmonary artery systolic pressure is borderline elevated at 32 mmHg.  5, There is no pericardial effusion.       ASSESSMENT/PLAN      Nathan [name] is a 70 year old male with T2DM admitted for syncope found to have HHS, NSTEMI, and submassive bilateral PE.    Plan of the day 4/15:   -Start apixaban 10mg BID for PE  -15 units of Glargine this AM, continue insulin gtt for 2 hours  -5 units of Lispro TID w/ meals, LDSSI  -Transfer to acute care medicine    #Submassive bilateral PE, high risk  #Syncope  #NSTEMI  #RV dysfunction  #Hypoxemic respiratory failure, resolved  #Shock, resolved  Submassive given RV dysfunction, elevated troponin, SBP 88. Meets criteria for thrombolysis given syncope, lactate >4. Shock index 0.9. Suspect troponin elevation is all due to RV strain as opposed to ACS.  - S/p alteplase 0.5mg/kg x 2 hours 4/14 1930 - 2130   - Apixaban 10mg BID for 7 days followed by 5mg BID  - Pre-thrombolysis fibrinogen 369, improved to 143 after    #HHS    #T2DM (A1c 7.5% on 12/26/19)  Presented with BG 800s, beta-hydroxybutarate < 3 consistent with HHS. S/p 3.5L bolus fluids. In terms of etiology likely lack of T2DM meds. No localizing signs infection. Will continue aggressive fluid resuscitation given EF 68%.   - Glargine 15 units 0900  - Lispro TID AC w/ meals  - LDSSI  - Continue insulin gtt until 1100  - Diabetes teaching prior to discharge  - Labs to BID  - HbA1c with am labs  - Supp K > 4     #AGMA, resolved  #Lactic Acidosis, resolved   #Metabolic alkalosis   Anion gap 14, bicarb 20, delta delta of 3 suggesting AGMA and metabolic alkalosis. AGMA more likely lactic acidosis given lactate 8.3 on admission, less likely ketoacidosis given beta-hydroxybutarate < 3. Lactic acidosis likely due to severe hypovolemia but will obtain blood cx's to rule  out sepsis. Metabolic alkalosis likely contraction alkalosis given dehydration.   - Fluid resuscitation as above  - Lactate trend 8.3 -> 5.0 -> 3.1 -> 1.8  -   Daily BMP   - Blood cx's 4/14 pending     #AKI  Likely prerenal. Baseline 1.23 in 12/2019, up to 2.38 on admission, down-trending.  - Daily BMP  - Encourage PO     ICU Checklist:    COVID-19 surveillance: neg 4/14  Delirium prevention/therapy: ICU protocol  Fluids/electrolytes: replete prn  Diet: No diet orders on file  Minimum mobility goal: ICU protocol  DVT Prophylaxis: Therapeutic AC  GI Prophylaxis: none indicated at this time  Glucose control: insulin gtt  Lines/Drains/Airways: 2 x PIV's  Labs: trend tropoin/lactate, daily CBC/BMP  Antibiotic stewardship: none  Disposition: MICU  Code Status: No Order    Contacts: Primary Emergency Contact: Nethery, Michael, Home Phone: [phone]  Next of Kin: Jeffords, Michael

## 2020-11-02 ENCOUNTER — Encounter (HOSPITAL_COMMUNITY): Payer: Self-pay | Admitting: Pulmonary Disease

## 2020-11-02 ENCOUNTER — Other Ambulatory Visit (HOSPITAL_BASED_OUTPATIENT_CLINIC_OR_DEPARTMENT_OTHER): Payer: Self-pay

## 2020-11-02 DIAGNOSIS — E1100 Type 2 diabetes mellitus with hyperosmolarity without nonketotic hyperglycemic-hyperosmolar coma (NKHHC): Secondary | ICD-10-CM

## 2020-11-02 DIAGNOSIS — Z794 Long term (current) use of insulin: Secondary | ICD-10-CM

## 2020-11-02 LAB — EKG 12 LEAD
Atrial Rate: 106 {beats}/min
Atrial Rate: 87 {beats}/min
Diagnosis: NORMAL
P Axis: 41 degrees
P Axis: 50 degrees
P-R Interval: 158 ms
P-R Interval: 160 ms
Q-T Interval: 374 ms
Q-T Interval: 382 ms
QRS Duration: 162 ms
QRS Duration: 168 ms
QTC Calculation: 496 ms
QTC Calculation: 459 ms
R Axis: -62 degrees
R Axis: -52 degrees
T Axis: -14 degrees
T Axis: -44 degrees
Ventricular Rate: 106 {beats}/min
Ventricular Rate: 87 {beats}/min

## 2020-11-02 LAB — GLUCOSE POC, HMC
Glucose (POC): 299 mg/dL — ABNORMAL HIGH (ref 62–125)
Glucose (POC): 304 mg/dL — ABNORMAL HIGH (ref 62–125)
Glucose (POC): 469 mg/dL — ABNORMAL HIGH (ref 62–125)
Glucose (POC): 237 mg/dL — ABNORMAL HIGH (ref 62–125)
Glucose (POC): 246 mg/dL — ABNORMAL HIGH (ref 62–125)
Glucose (POC): 132 mg/dL — ABNORMAL HIGH (ref 62–125)
Glucose (POC): 265 mg/dL — ABNORMAL HIGH (ref 62–125)
Glucose (POC): 121 mg/dL (ref 62–125)
Glucose (POC): 192 mg/dL — ABNORMAL HIGH (ref 62–125)
Glucose (POC): 177 mg/dL — ABNORMAL HIGH (ref 62–125)
Glucose (POC): 359 mg/dL — ABNORMAL HIGH (ref 62–125)
Glucose (POC): 314 mg/dL — ABNORMAL HIGH (ref 62–125)
Glucose (POC): 288 mg/dL — ABNORMAL HIGH (ref 62–125)
Glucose (POC): 349 mg/dL — ABNORMAL HIGH (ref 62–125)
Glucose (POC): 320 mg/dL — ABNORMAL HIGH (ref 62–125)

## 2020-11-02 LAB — LIPID PANEL
Cholesterol (LDL): 107 mg/dL (ref <130)
Cholesterol/HDL Ratio: 6.2
HDL Cholesterol: 30 mg/dL — ABNORMAL LOW (ref >39)
Non-HDL Cholesterol: 156 mg/dL (ref 0–159)
Total Cholesterol: 186 mg/dL (ref <200)
Triglyceride: 247 mg/dL — ABNORMAL HIGH (ref <150)

## 2020-11-02 LAB — BASIC METABOLIC PANEL
Anion Gap: 10 (ref 4–12)
Anion Gap: 6 (ref 4–12)
Anion Gap: 7 (ref 4–12)
Anion Gap: 8 (ref 4–12)
Anion Gap: 9 (ref 4–12)
Calcium: 8.1 mg/dL — ABNORMAL LOW (ref 8.9–10.2)
Calcium: 8.4 mg/dL — ABNORMAL LOW (ref 8.9–10.2)
Calcium: 8.4 mg/dL — ABNORMAL LOW (ref 8.9–10.2)
Calcium: 8.7 mg/dL — ABNORMAL LOW (ref 8.9–10.2)
Calcium: 9 mg/dL (ref 8.9–10.2)
Carbon Dioxide, Total: 25 meq/L (ref 22–32)
Carbon Dioxide, Total: 27 meq/L (ref 22–32)
Carbon Dioxide, Total: 28 meq/L (ref 22–32)
Carbon Dioxide, Total: 29 meq/L (ref 22–32)
Carbon Dioxide, Total: 31 meq/L (ref 22–32)
Chloride: 102 meq/L (ref 98–108)
Chloride: 103 meq/L (ref 98–108)
Chloride: 106 meq/L (ref 98–108)
Chloride: 108 meq/L (ref 98–108)
Chloride: 109 meq/L — ABNORMAL HIGH (ref 98–108)
Creatinine: 1.52 mg/dL — ABNORMAL HIGH (ref 0.51–1.18)
Creatinine: 1.6 mg/dL — ABNORMAL HIGH (ref 0.51–1.18)
Creatinine: 1.6 mg/dL — ABNORMAL HIGH (ref 0.51–1.18)
Creatinine: 1.63 mg/dL — ABNORMAL HIGH (ref 0.51–1.18)
Creatinine: 1.7 mg/dL — ABNORMAL HIGH (ref 0.51–1.18)
Glucose: 152 mg/dL — ABNORMAL HIGH (ref 62–125)
Glucose: 176 mg/dL — ABNORMAL HIGH (ref 62–125)
Glucose: 192 mg/dL — ABNORMAL HIGH (ref 62–125)
Glucose: 294 mg/dL — ABNORMAL HIGH (ref 62–125)
Glucose: 324 mg/dL — ABNORMAL HIGH (ref 62–125)
Potassium: 3.4 meq/L — ABNORMAL LOW (ref 3.6–5.2)
Potassium: 3.8 meq/L (ref 3.6–5.2)
Potassium: 4 meq/L (ref 3.6–5.2)
Potassium: 5.2 meq/L (ref 3.6–5.2)
Sodium: 137 meq/L (ref 135–145)
Sodium: 139 meq/L (ref 135–145)
Sodium: 142 meq/L (ref 135–145)
Sodium: 145 meq/L (ref 135–145)
Sodium: 145 meq/L (ref 135–145)
Urea Nitrogen: 21 mg/dL (ref 8–21)
Urea Nitrogen: 22 mg/dL — ABNORMAL HIGH (ref 8–21)
Urea Nitrogen: 22 mg/dL — ABNORMAL HIGH (ref 8–21)
Urea Nitrogen: 24 mg/dL — ABNORMAL HIGH (ref 8–21)
Urea Nitrogen: 26 mg/dL — ABNORMAL HIGH (ref 8–21)
eGFR by CKD-EPI: 41 mL/min/{1.73_m2} — ABNORMAL LOW (ref >59)
eGFR by CKD-EPI: 43 mL/min/{1.73_m2} — ABNORMAL LOW (ref >59)
eGFR by CKD-EPI: 44 mL/min/{1.73_m2} — ABNORMAL LOW (ref >59)
eGFR by CKD-EPI: 44 mL/min/{1.73_m2} — ABNORMAL LOW (ref >59)
eGFR by CKD-EPI: 46 mL/min/{1.73_m2} — ABNORMAL LOW (ref >59)

## 2020-11-02 LAB — HEPATIC FUNCTION PANEL
ALT (GPT): 33 U/L (ref 10–48)
ALT (GPT): 38 U/L (ref 10–48)
AST (GOT): 30 U/L (ref 9–38)
AST (GOT): 42 U/L — ABNORMAL HIGH (ref 9–38)
Albumin: 2.9 g/dL — ABNORMAL LOW (ref 3.5–5.2)
Albumin: 3.1 g/dL — ABNORMAL LOW (ref 3.5–5.2)
Alkaline Phosphatase (Total): 103 U/L (ref 36–161)
Alkaline Phosphatase (Total): 95 U/L (ref 36–161)
Bilirubin (Direct): 0.1 mg/dL (ref 0.0–0.3)
Bilirubin (Direct): 0.1 mg/dL (ref 0.0–0.3)
Bilirubin (Total): 0.7 mg/dL (ref 0.2–1.3)
Bilirubin (Total): 0.8 mg/dL (ref 0.2–1.3)
Protein (Total): 5.5 g/dL — ABNORMAL LOW (ref 6.0–8.2)
Protein (Total): 5.9 g/dL — ABNORMAL LOW (ref 6.0–8.2)

## 2020-11-02 LAB — IONIZED CALCIUM, PLASMA
Ionized Calcium, Plasma: 1.17 mmol/L — ABNORMAL LOW (ref 1.18–1.38)
Ionized Calcium, Plasma: 1.29 mmol/L (ref 1.18–1.38)
Ionized Calcium, Plasma: 1.24 mmol/L (ref 1.18–1.38)

## 2020-11-02 LAB — COVID-19 CORONAVIRUS QUALITATIVE PCR: COVID-19 Coronavirus Qual PCR Result: NOT DETECTED

## 2020-11-02 LAB — CBC (HEMOGRAM)
Hematocrit: 40 % (ref 38.0–50.0)
Hemoglobin: 13.7 g/dL (ref 13.0–18.0)
MCH: 29.3 pg (ref 27.3–33.6)
MCHC: 34.6 g/dL (ref 32.2–36.5)
MCV: 85 fL (ref 81–98)
Platelet Count: 146 10*3/uL — ABNORMAL LOW (ref 150–400)
RBC: 4.67 10*6/uL (ref 4.40–5.60)
RDW-CV: 12.8 % (ref 11.6–14.4)
WBC: 7.02 10*3/uL (ref 4.3–10.0)

## 2020-11-02 LAB — PHOSPHATE
Phosphate: 3.4 mg/dL (ref 2.5–4.5)
Phosphate: 4.2 mg/dL (ref 2.5–4.5)
Phosphate: 3.4 mg/dL (ref 2.5–4.5)

## 2020-11-02 LAB — LAB ADD ON ORDER

## 2020-11-02 LAB — OSMOLALITY
Osmolality: 313 mosm/kg — ABNORMAL HIGH (ref 280–300)
Osmolality: 309 mosm/kg — ABNORMAL HIGH (ref 280–300)
Osmolality: 311 mosm/kg — ABNORMAL HIGH (ref 280–300)

## 2020-11-02 LAB — PROTHROMBIN & PTT
Partial Thromboplastin Time: 44 s — ABNORMAL HIGH (ref 22–35)
Prothrombin INR: 1.3 (ref 0.8–1.3)
Prothrombin Time Patient: 16.4 s — ABNORMAL HIGH (ref 10.7–15.6)

## 2020-11-02 LAB — MAGNESIUM
Magnesium: 1.9 mg/dL (ref 1.8–2.4)
Magnesium: 2.4 mg/dL (ref 1.8–2.4)
Magnesium: 2 mg/dL (ref 1.8–2.4)

## 2020-11-02 LAB — FIBRINOGEN: Fibrinogen: 143 mg/dL — ABNORMAL LOW (ref 150–450)

## 2020-11-02 LAB — BLOOD GAS, VENOUS (NO ELECTROLYTES)
Base Excess, Blood, VEN: 3.8 meq/L — ABNORMAL HIGH (ref 0.0–3.0)
Bicarbonate, VEN: 29 meq/L — ABNORMAL HIGH (ref 23–27)
pCO2, VEN: 49 mmHg (ref 42–50)
pH, VEN: 7.4 (ref 7.32–7.40)
pO2, VEN: 31 mmHg — ABNORMAL LOW (ref 35–40)

## 2020-11-02 LAB — TROPONIN_I
Troponin_I: 1.57 ng/mL (ref <0.04)
Troponin_I: 2.34 ng/mL (ref <0.04)

## 2020-11-02 LAB — L LACTATE, VENOUS WB (TO U/H LAB WITHIN 30 MIN): L Lactate (Direct), Venous Whole Blood: 1.8 mmol/L (ref 0.6–1.9)

## 2020-11-02 LAB — ANTI-XA FOR UNFRACTIONATED HEPARIN
Anti-Xa for Unfractionated Heparin: 0.29 [IU]/mL
Anti-Xa for Unfractionated Heparin: 0.76 [IU]/mL

## 2020-11-02 MED ORDER — HEPARIN 25,000 UNITS IN D5W 250 ML INFUSION (PKG PREMIX)
0.0000 [IU]/kg/h | INJECTION | Status: DC
Start: 2020-11-02 — End: 2020-11-02
  Administered 2020-11-02: 12 [IU]/kg/h via INTRAVENOUS

## 2020-11-02 MED ORDER — INSULIN LISPRO 100 UNIT/ML IJ SOLN
10.0000 [IU] | Freq: Three times a day (TID) | INTRAMUSCULAR | Status: AC
Start: 2020-11-02 — End: 2020-11-04
  Administered 2020-11-02 – 2020-11-04 (×8): 10 [IU] via SUBCUTANEOUS
  Filled 2020-11-02 (×8): qty 10

## 2020-11-02 MED ORDER — MAGNESIUM SULFATE 1 G IN NS 52 ML IVPB (PKG PREMIX)
1.0000 g | INJECTION | Freq: Once | Status: AC
Start: 2020-11-02 — End: 2020-11-02
  Administered 2020-11-02: 1 g via INTRAVENOUS
  Filled 2020-11-02: qty 1

## 2020-11-02 MED ORDER — APIXABAN 5 MG OR TABS
5.0000 mg | ORAL_TABLET | Freq: Two times a day (BID) | ORAL | Status: DC
Start: 2020-11-09 — End: 2020-11-05

## 2020-11-02 MED ORDER — DEXTROSE 10% IV SOLUTION BOLUS
125.0000 mL | Status: DC | PRN
Start: 2020-11-02 — End: 2020-11-02

## 2020-11-02 MED ORDER — DEXTROSE 10% IV SOLUTION BOLUS
125.0000 mL | Status: DC | PRN
Start: 2020-11-02 — End: 2020-11-05

## 2020-11-02 MED ORDER — INSULIN LISPRO 100 UNIT/ML IJ SOLN
0.0000 [IU] | Freq: Every evening | INTRAMUSCULAR | Status: DC
Start: 2020-11-02 — End: 2020-11-02

## 2020-11-02 MED ORDER — POTASSIUM CHLORIDE CRYS ER 20 MEQ OR TBCR
40.0000 meq | EXTENDED_RELEASE_TABLET | Freq: Once | ORAL | Status: DC
Start: 2020-11-02 — End: 2020-11-02

## 2020-11-02 MED ORDER — ALLOPURINOL 100 MG OR TABS
200.0000 mg | ORAL_TABLET | Freq: Every day | ORAL | Status: DC
Start: 2020-11-02 — End: 2020-11-05
  Administered 2020-11-02 – 2020-11-05 (×4): 200 mg via ORAL
  Filled 2020-11-02 (×4): qty 2

## 2020-11-02 MED ORDER — INSULIN LISPRO 100 UNIT/ML IJ SOLN
0.0000 [IU] | Freq: Three times a day (TID) | INTRAMUSCULAR | Status: DC
Start: 2020-11-02 — End: 2020-11-02
  Filled 2020-11-02: qty 1

## 2020-11-02 MED ORDER — ATORVASTATIN CALCIUM 40 MG OR TABS
40.0000 mg | ORAL_TABLET | Freq: Every day | ORAL | Status: DC
Start: 2020-11-03 — End: 2020-11-05
  Administered 2020-11-03 – 2020-11-05 (×3): 40 mg via ORAL
  Filled 2020-11-02 (×3): qty 1

## 2020-11-02 MED ORDER — INSULIN LISPRO 100 UNIT/ML IJ SOLN
5.0000 [IU] | Freq: Three times a day (TID) | INTRAMUSCULAR | Status: DC
Start: 2020-11-02 — End: 2020-11-02
  Filled 2020-11-02: qty 5

## 2020-11-02 MED ORDER — POTASSIUM CHLORIDE CRYS ER 20 MEQ OR TBCR
20.0000 meq | EXTENDED_RELEASE_TABLET | Freq: Once | ORAL | Status: AC
Start: 2020-11-02 — End: 2020-11-02
  Administered 2020-11-02: 20 meq via ORAL
  Filled 2020-11-02: qty 1

## 2020-11-02 MED ORDER — GLUCAGON HCL RDNA (DIAGNOSTIC) 1 MG IJ SOLR
0.5000 mg | INTRAMUSCULAR | Status: DC | PRN
Start: 2020-11-02 — End: 2020-11-02

## 2020-11-02 MED ORDER — APIXABAN 5 MG OR TABS
10.0000 mg | ORAL_TABLET | Freq: Two times a day (BID) | ORAL | Status: DC
Start: 2020-11-02 — End: 2020-11-05
  Administered 2020-11-02 – 2020-11-05 (×7): 10 mg via ORAL
  Filled 2020-11-02 (×7): qty 2

## 2020-11-02 MED ORDER — INSULIN LISPRO 100 UNIT/ML IJ SOLN
0.0000 [IU] | Freq: Three times a day (TID) | INTRAMUSCULAR | Status: DC
Start: 2020-11-02 — End: 2020-11-05
  Administered 2020-11-02: 10 [IU] via SUBCUTANEOUS
  Administered 2020-11-03: 7 [IU] via SUBCUTANEOUS
  Administered 2020-11-03 – 2020-11-04 (×3): 4 [IU] via SUBCUTANEOUS
  Administered 2020-11-04: 12 [IU] via SUBCUTANEOUS
  Administered 2020-11-04 – 2020-11-05 (×3): 4 [IU] via SUBCUTANEOUS
  Filled 2020-11-02: qty 4
  Filled 2020-11-02: qty 7
  Filled 2020-11-02: qty 10
  Filled 2020-11-02 (×5): qty 4
  Filled 2020-11-02: qty 12

## 2020-11-02 MED ORDER — INSULIN LISPRO 100 UNIT/ML IJ SOLN
0.0000 [IU] | Freq: Every evening | INTRAMUSCULAR | Status: DC
Start: 2020-11-02 — End: 2020-11-05
  Administered 2020-11-02 – 2020-11-04 (×3): 3 [IU] via SUBCUTANEOUS
  Filled 2020-11-02: qty 3
  Filled 2020-11-02: qty 4
  Filled 2020-11-02: qty 3

## 2020-11-02 MED ORDER — GLUCAGON HCL RDNA (DIAGNOSTIC) 1 MG IJ SOLR
0.5000 mg | INTRAMUSCULAR | Status: DC | PRN
Start: 2020-11-02 — End: 2020-11-05

## 2020-11-02 MED ORDER — DEXTROSE IN LACTATED RINGERS 5 % IV SOLN
50.0000 mL/h | INTRAVENOUS | Status: DC
Start: 2020-11-02 — End: 2020-11-02
  Administered 2020-11-02: 50 mL/h via INTRAVENOUS

## 2020-11-02 MED ORDER — INSULIN GLARGINE 100 UNIT/ML SC SOLN VIAL WRAPPER
15.0000 [IU] | Freq: Once | SUBCUTANEOUS | Status: AC
Start: 2020-11-02 — End: 2020-11-02
  Administered 2020-11-02: 15 [IU] via SUBCUTANEOUS
  Filled 2020-11-02: qty 15

## 2020-11-02 MED ORDER — GLUCAGON HCL RDNA (DIAGNOSTIC) 1 MG IJ SOLR
1.0000 mg | INTRAMUSCULAR | Status: DC | PRN
Start: 2020-11-02 — End: 2020-11-05

## 2020-11-02 MED ORDER — GLUCAGON HCL RDNA (DIAGNOSTIC) 1 MG IJ SOLR
1.0000 mg | INTRAMUSCULAR | Status: DC | PRN
Start: 2020-11-02 — End: 2020-11-02

## 2020-11-02 MED ORDER — MAGNESIUM SULFATE 2 GM/50ML SWFI IV SOLN
2.0000 g | Freq: Once | INTRAVENOUS | Status: DC
Start: 2020-11-02 — End: 2020-11-02

## 2020-11-02 MED ORDER — POTASSIUM & SODIUM PHOSPHATES 280-160-250 MG OR PACK
1.0000 | PACK | Freq: Once | ORAL | Status: AC
Start: 2020-11-02 — End: 2020-11-02
  Administered 2020-11-02: 1 via ORAL
  Filled 2020-11-02: qty 1

## 2020-11-02 NOTE — ED Procedure Notes (Signed)
PROCEDURE    Emergency Physician Performed Focused Clinical Ultrasound    A focused ultrasound exam of the heart was performed.    ED POCUS - Cardiac Ultrasound and Volume Assessment    Date/Time: 11/01/2020 12:20 PM  Performed by: Aileen Pilot, MD  Authorized by: Tyrone Sage, MD     Indications:  Hypotension, dyspnea, hypoxia    Findings:   A pericardial effusion was absent  The patients left ventricular systolic function is mildly decreased  RV dilation was present  D sign was absent  The patient is spontaneously breathing  The patients IVC was visualized and was of normal caliber with decreased respiratory variation    Impression:   There is no pericardial effusion. There are no signs of tamponade physiology.  The patients left ventricular systolic function is mildly decreased  Comments: No pericardial effusion, RV dilated, potential right heart strain LV appears to be relatively empty, IVC however does not completely collapse on inspiration.      Images captured and available for review.    Please note: This limited bedside echocardiogram does not evaluate diastolic function, wall motion abnormalities, detailed structural evaluation including chamber size and thickness, or valvular function.  If these are desired recommend a cardiology based echocardiogram.                Conya Ellinwood, Rollene Rotunda, MD  11/02/20 1055

## 2020-11-02 NOTE — H&P (Addendum)
History and Physical     Nathan [name] Para") - DOB: 06-30-1951 (70 year old male)  Preferred Pronouns: he/him/his  PCP: [name], MD   Code Status: Full Code       CHIEF CONCERN / IDENTIFICATION:  Nathan [name] is a 70 year old male with gout and T2DM who presented after a syncopal event found to have submassive bilateral PEs.      SUBJECTIVE   HISTORY OF PRESENT ILLNESS:   Per MICU admit note:   " [name] says that he has had a dry mouth, white tongue, and blurry vision for a couple of months. He had been trying to drink lots of juice to help with his dry mouth but he was unable to keep up.     [name] says that this morning he was profoundly thirsty and tried drinking juice to make it better. He walked to the bathroom and then fell, he isn't sure if he lost consciousness but he ended up on the floor. He called 9-1-1 at that point. He was able to get himself up from the floor after falling to let EMS into his apartment. EMS found him to be tachypneic to the 40s and placed him on a non-rebreather.     On arrival to the [facility] ER his initial vitals were HR 107, BP 76/65, RR 28, SpO2 98% on 15L oxygen. His initial labs showed a glucose of >600, BMP with AGAP of 19, lactate of 8.3, osm 330, HBH+, initial troponin of 0.17. His ECG showed a bifascicular block, no T wave inversions or ST changes.      Review of Systems  he denied any preceding viral illness, fevers, chills, dysuria. He has felt dizzy recently but he denies headaches. Denies CP. Endorsed abdominal queasiness, denies pain but states could possibly be pressure. Did have SOB when found by EMS"    He reports today that he is feeling improved and wants to leave tomorrow. He does not report any chest pain, nausea, abdominal pain, or trouble breathing. He feels his right knee is feeling tight and thinks it may be a gout flare. He also understands that he may have to take insulin at home going forward--he notes that he was not told in  the past about diabetes.     He reports no prior history of clots, long distance travel, or surgical intervention. He reports that his PCP has done stool testing and told him that there is no concern for colon ca. He thinks this may be around February of this year.     Review of Systems  All systems reviewed and negative except for what is mentioned in HPI      HISTORY   Problem List   Diagnosis    Obesity, unspecified    Unspecified asthma, with status asthmaticus    Generalized osteoarthrosis, unspecified site    Other hyperlipidemia    Presbyopia    Gout    Irregular heart beat    Achilles tendinosis of right lower extremity    Mild intermittent asthma without complication    Healthcare maintenance    Non-seasonal allergic rhinitis    Hyperosmolar hyperglycemic state (HHS) ([place])       Past Surgical History:   Procedure Laterality Date    PR UNLISTED PROCEDURE LEG/ANKLE         Social History     Tobacco Use    Smoking status: Former Banker date:  07/21/1961     Quit date: 07/22/1971     Years since quitting: 49.3    Smokeless tobacco: Never Used   Substance and Sexual Activity    Alcohol use: No    Drug use: No    Sexual activity: Not on file   Social History Narrative    Originally from [place]. Lives by himself in apartment with section 8. Has brother in [place] and one in [place]. 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 Likes to go fishing, reads the bible.         Tobacco: smoked age [age] approx 1 pack a week. Stopped now.     Alcohol: Denies. Remote drinking in last teens     Substances: Denies        Family History   Problem Relation Age of Onset    Blindness No Hx Of     Cataracts No Hx Of     Glaucoma No Hx Of     Macular Degeneration No Hx Of     No Known Problems Father      Mother     Retinal Detachment No Hx Of           OUTPATIENT MEDICATIONS:   Current Outpatient Medications    Medication Instructions    Albuterol Sulfate HFA 108 (90 Base) MCG/ACT Inhalation Aero Soln 2 puffs, Inhalation, Every 6 hours PRN    allopurinol (ZYLOPRIM) 200 mg, Oral, Daily    atorvastatin 40 MG tablet Take 1 tablet (40 mg) by mouth every evening. To lower cholesterol. (past due for annual exam)    colchicine 0.6 MG tablet Take 2 tablets by mouth at first sign of flare followed by 1 table 1 hour later    Fluticasone Propionate HFA 110 MCG/ACT Inhalation Aerosol 1 puff, Inhalation, Every 12 hours    ketoconazole 2 % shampoo Apply to face and scalp as needed    loratadine (CLARITIN) 10 mg, Oral, Daily PRN, (past due for annual exam/follow up appointment)    Triamcinolone Acetonide 0.1 % External Ointment Neck, 2 times daily       ALLERGIES:   Patient has no known allergies.      OBJECTIVE     Vitals (Arrival)      T: 36 C (11/01/20 1212)  BP: (!) 76/65 (11/01/20 1212)  HR: (!) 107 (11/01/20 1212)  RR: (!) 28 (11/01/20 1212)  SpO2: 98 % (11/01/20 1212) Nasal cannula 15 L/min     Vitals (Most recent in last 24 hrs)   T: 36.1 C (11/02/20 0800)  BP: 106/64 (11/02/20 1000)  HR: 75 (11/02/20 1000)  RR: 20 (11/02/20 1000)  SpO2: 99 % (11/02/20 1000) Room air  T range: Temp  Min: 36.1 C  Max: 36.5 C  Wt 236 lb 15.9 oz (107.5 kg)     Ht _0  (1.676 m)     Body mass index is 38.25 kg/m.       Physical Exam  Constitutional:       Comments: Interactive, sitting up in bed, appears comfortable    Eyes:      Extraocular Movements: Extraocular movements intact.      Pupils: Pupils are equal, round, and reactive to light.   Cardiovascular:      Rate and Rhythm: Normal rate.      Heart sounds: No murmur heard.  No gallop.    Pulmonary:  Effort: Pulmonary effort is normal. No tachypnea.      Breath sounds: Normal breath sounds. No decreased breath sounds, wheezing, rhonchi or rales.   Chest:      Chest wall: No mass or tenderness.   Abdominal:      Palpations: Abdomen is soft. There is no splenomegaly or mass.       Tenderness: There is no abdominal tenderness. There is no guarding.   Musculoskeletal:         General: Normal range of motion.   Skin:     General: Skin is warm and dry.   Neurological:      General: No focal deficit present.      Mental Status: He is alert and oriented to person, place, and time.   Psychiatric:         Mood and Affect: Mood normal.         Behavior: Behavior normal.         Labs (last 24 hours):   Chemistries  CBC  LFT  Gases, other   145 108 22 314   13.7   AST: 30 ALT: 33  -/-/-/-   7.40/49/31/29   3.8 27 1.63   7.02 >< 146  AP: 95 T bili: 0.8  Lact (a): - Lact (v): 1.8   eGFR: 43 Ca: 9.0   40   Prot: 5.5 Alb: 2.9  Trop I: 1.57 D-dimer: -   Mg: 2.4 PO4: 4.2  ANC: -     BNP: 38 Anti-Xa: 0.76     ALC: -    INR: 1.3        IMAGING:   CTA Chest PE w Contrast    Result Date: 11/01/2020  Large pulmonary arterial embolic burden involving all lobar arteries of the bilateral lungs. The RV/LV ratio is greater than 1, raising the possibility of right ventricular pressure overload. Additionally, there is flattening of the intraventricular septum with mild septal bowing. Indications were communicated telephonically to Dr. Augustin Coupe by Dr. Donald Pore at [place] on 11/01/2020. ATTENDING COMMENT: Main pulmonary artery measures 3.3 cm suggestive of pulmonary hypertension. +ct+ I have personally reviewed the images and agree with the report (or as edited).    XR Chest 1 Vw    Result Date: 11/01/2020  Lungs: Clear. Pleura: No effusion. No pneumothorax. Heart and mediastinum: Unremarkable. Bones: No acute or suspicious abnormality.       TTE: 11/01/20  Conclusion  1. The left ventricle is normal in size. There is normal left ventricular  wall thickness. Global left ventricular function is normal (biplane EF 68%).  No regional wall motion abnormalities are present.  2. The right ventricle is severely dilated. The right ventricular systolic  function is severely decreased. Right ventricular segmental wall motion  abnormalities are  present with decreased function of the mid segment. The  right atrium is mildly dilated.  3. Normal valve structure and function.  4. The pulmonary artery systolic pressure is borderline elevated at 32 mmHg.  5, There is no pericardial effusion.       ASSESSMENT/PLAN        Nathan [name] is a 70 year old male with gout and T2DM who presented after a syncopal event found to have submassive bilateral PEs requiring ICU admission currently improving and transitioning to acute care.     #Submassive bilateral PE  #Syncope   #NSTEMi   #RV dysfunction   #Hypoxemic respiratory failure: resolved   #Shock: resolved  On presentation noted to have  elevated troponin, low blood pressure, elevated lactate, with RV dysfucntion noted on bedside ultrasound. He was treated with half dose TPA on 04/14. His hemodynamics improved with this treatment. He no longer requires supplemental oxygen, shock resolved, and troponin down trending. NSTEMI thought likely in the setting of submassive PE and not likely ACS. He is now transitioned to apixaban 96m BID   - continue anticoagulation     #HHS  #T2DM (a1c of 7.5% 6/21)  Presented with elevated blood sugar leves in the 800s. Clinical picture more consistent with HHS and he was given 3L fluid bolus. He was alo started on insulin gtt and has since been transitioned to subq insulin. He reports not having been on diabetes managment prior to presentation. He is open to the idea of insulin on discharge.   - s/p glargine 15units this AM   - continue 10units TID with premeal   - HDSSI   - DM education   - glycemic team consult for new insulin     #AKI: resolving. Baseline of 1.2. Currently 1.6.  - continue to monitor     #Gout: hx of gout flares in the LEs. Unclear if currently having one. Reports some tenderness in the left knee, but no clinical s/s concerning for acute flare.   - continue allopurinol     Resolved:  #AGMA   #Lactic acidosis  #Metabolic alkalosis       Inpatient Checklist:     Fluids/Electrolytes/Nutrition: carb managed diet   Prophylaxis: full ac  Lines/Drains/Airways: PIV  Disposition: acute care   Code Status: Full Code    Contacts: Primary Emergency Contact: SGustavo Lah Home Phone: [phone]

## 2020-11-02 NOTE — Nursing Note (Signed)
All belongings accounted for with patient. Transported to 416-2 on cardiac monitor and pt bed, uneventful transfer. Pt connected to telebox and turned on. Bedside RN notified of patient in room.      Patient Summary  4/14 Syncopal episode experienced by patient but able to call 911, medics found pt tachpyneic and hypoxic, placed on NRB, denies chest pain. Upon arrival to [facility], pt hypotensive with MAP < 65 mmgh.BS was elevated, found to be > 800 with elevated anion gap.  Patient in DKA.  Patient 1st troponin I was elevated, has NSTEMI, potentially from high metabolic demand but ACS is also still on differential.  Patient started on insulin gtt. Patient breathing and blood pressure better after 500 cc of LR x2 (1000 cc).  AKI likely from volume depletion.  Patient does not have history of diabetes but he does describe recent polydipsia (drinking lots of juice) and frequency.  Admit to MICU    4/15 Transitioned from insulin gtt to SUBQ insulin, PO intake good. D/c heparin gtt, start apixaban.         Edited by: Holly Bodily, RN at 11/02/2020 1753    Illness Severity  Stable  Edited byHolly Bodily, RN at 11/02/2020 1601

## 2020-11-02 NOTE — Nursing Note (Signed)
Illness Severity  Watcher    Patient Summary  4/14: Pt called EMS after syncopal episode at home. EMS found pt to be hypoxic and hypoglycemic. In ED, pt was started on DKA/HHS protocol for BGL in 600's, Gap of 19. Pt also with NSTEMI (troponin 2.3) so bedside echo was preformed which showed dilated RV. Pt sent for CTA of chest which showed bilateral PE. Pt received TPA and then Heparin gtt was started.    4/14 night shift: Pt alert and oriented. No complaints of pain. Normal sinus rhythm with frequent PVC's/PAC's. BP labile but responsive to fluid boluses. Pt on RA. NPO except sips/chips. Voids in urinal, adequate UOP. Skin intact. Insulin and Heparin gtt running and being titrated per order/protocol.     Action Items  -DKA/HHS protocol      Situational Awareness  Brother: Legrand Como

## 2020-11-03 ENCOUNTER — Other Ambulatory Visit (HOSPITAL_BASED_OUTPATIENT_CLINIC_OR_DEPARTMENT_OTHER): Payer: Self-pay

## 2020-11-03 DIAGNOSIS — I2690 Septic pulmonary embolism without acute cor pulmonale: Secondary | ICD-10-CM

## 2020-11-03 LAB — BASIC METABOLIC PANEL
Anion Gap: 8 (ref 4–12)
Calcium: 8.3 mg/dL — ABNORMAL LOW (ref 8.9–10.2)
Carbon Dioxide, Total: 26 meq/L (ref 22–32)
Chloride: 106 meq/L (ref 98–108)
Creatinine: 1.4 mg/dL — ABNORMAL HIGH (ref 0.51–1.18)
Glucose: 210 mg/dL — ABNORMAL HIGH (ref 62–125)
Potassium: 3.6 meq/L (ref 3.6–5.2)
Sodium: 140 meq/L (ref 135–145)
Urea Nitrogen: 19 mg/dL (ref 8–21)
eGFR by CKD-EPI: 51 mL/min/{1.73_m2} — ABNORMAL LOW (ref >59)

## 2020-11-03 LAB — R/O MRSA

## 2020-11-03 LAB — IONIZED CALCIUM, PLASMA: Ionized Calcium, Plasma: 1.26 mmol/L (ref 1.18–1.38)

## 2020-11-03 LAB — GLUCOSE POC, HMC
Glucose (POC): 203 mg/dL — ABNORMAL HIGH (ref 62–125)
Glucose (POC): 254 mg/dL — ABNORMAL HIGH (ref 62–125)
Glucose (POC): 248 mg/dL — ABNORMAL HIGH (ref 62–125)
Glucose (POC): 273 mg/dL — ABNORMAL HIGH (ref 62–125)

## 2020-11-03 LAB — PHOSPHATE: Phosphate: 3.7 mg/dL (ref 2.5–4.5)

## 2020-11-03 LAB — CBC (HEMOGRAM)
Hematocrit: 41 % (ref 38.0–50.0)
Hemoglobin: 13.7 g/dL (ref 13.0–18.0)
MCH: 28.7 pg (ref 27.3–33.6)
MCHC: 33.3 g/dL (ref 32.2–36.5)
MCV: 86 fL (ref 81–98)
Platelet Count: 137 10*3/uL — ABNORMAL LOW (ref 150–400)
RBC: 4.77 10*6/uL (ref 4.40–5.60)
RDW-CV: 12.4 % (ref 11.6–14.4)
WBC: 4.55 10*3/uL (ref 4.3–10.0)

## 2020-11-03 LAB — MAGNESIUM: Magnesium: 1.6 mg/dL — ABNORMAL LOW (ref 1.8–2.4)

## 2020-11-03 LAB — COVID-19 CORONAVIRUS QUALITATIVE PCR: COVID-19 Coronavirus Qual PCR Result: NOT DETECTED

## 2020-11-03 MED ORDER — COLCHICINE 0.6 MG OR TABS
0.6000 mg | ORAL_TABLET | Freq: Once | ORAL | Status: AC
Start: 2020-11-03 — End: 2020-11-03
  Administered 2020-11-03: 0.6 mg via ORAL
  Filled 2020-11-03: qty 1

## 2020-11-03 MED ORDER — COLCHICINE 0.6 MG OR TABS
1.2000 mg | ORAL_TABLET | Freq: Once | ORAL | Status: AC
Start: 2020-11-03 — End: 2020-11-03
  Administered 2020-11-03: 1.2 mg via ORAL
  Filled 2020-11-03: qty 2

## 2020-11-03 MED ORDER — INSULIN GLARGINE 100 UNIT/ML SC SOLN VIAL WRAPPER
24.0000 [IU] | Freq: Every morning | SUBCUTANEOUS | Status: DC
Start: 2020-11-03 — End: 2020-11-03

## 2020-11-03 MED ORDER — GLUCOSE BLOOD VI STRP
1.0000 | ORAL_STRIP | Freq: Four times a day (QID) | 0 refills | Status: DC
Start: 2020-11-03 — End: 2021-05-13
  Filled 2020-11-03: qty 100, 25d supply, fill #0

## 2020-11-03 MED ORDER — ONETOUCH VERIO FLEX SYSTEM W/DEVICE KIT
PACK | Freq: Four times a day (QID) | 0 refills | Status: DC
Start: 2020-11-03 — End: 2021-11-25
  Filled 2020-11-03: qty 1, 30d supply, fill #0

## 2020-11-03 MED ORDER — ONDANSETRON HCL 4 MG OR TABS
4.0000 mg | ORAL_TABLET | Freq: Three times a day (TID) | ORAL | Status: DC | PRN
Start: 2020-11-03 — End: 2020-11-05

## 2020-11-03 MED ORDER — ONETOUCH DELICA PLUS LANCET33G MISC
1.0000 | Freq: Four times a day (QID) | 0 refills | Status: DC
Start: 2020-11-03 — End: 2021-08-27
  Filled 2020-11-03: qty 100, 25d supply, fill #0

## 2020-11-03 MED ORDER — INSULIN GLARGINE 100 UNIT/ML SC SOLN VIAL WRAPPER
26.0000 [IU] | Freq: Every morning | SUBCUTANEOUS | Status: DC
Start: 2020-11-03 — End: 2020-11-04
  Administered 2020-11-03 – 2020-11-04 (×2): 26 [IU] via SUBCUTANEOUS
  Filled 2020-11-03 (×2): qty 26

## 2020-11-03 MED ORDER — INSULIN GLARGINE 100 UNIT/ML SC SOLN VIAL WRAPPER
20.0000 [IU] | Freq: Every morning | SUBCUTANEOUS | Status: DC
Start: 2020-11-03 — End: 2020-11-03

## 2020-11-03 MED ORDER — SENNOSIDES 8.6 MG OR TABS
17.2000 mg | ORAL_TABLET | Freq: Two times a day (BID) | ORAL | Status: DC | PRN
Start: 2020-11-03 — End: 2020-11-05

## 2020-11-03 MED ORDER — ONDANSETRON HCL 4 MG/2ML IJ SOLN
4.0000 mg | Freq: Three times a day (TID) | INTRAMUSCULAR | Status: DC | PRN
Start: 2020-11-03 — End: 2020-11-05

## 2020-11-03 NOTE — Progress Notes (Signed)
.  CCN Assessment Note    Assessment Type: Discharge Planning Assessment ( Face-to-Face)     CCN met with patient at bedside on 4E. Patient pleasant and calm with interview, Reports that he lives home alone, Plans to return home with big changes to his diet. Reports he will fill new meds at [facility] and get a ride home from his brother Elta Guadeloupe. Already has follow up appt with PCP at [place] [facility] Nurse met with patient to discuss care coordination needs during and after this hospital stay. Patient preferences, availability of medical services close to home and insurance network options for home health, home equipment/supplies, medications and follow-up are all included in transition plans.    Visit Information  Admission diagnosis: Hyperosmolar hyperglycemic state (HHS) [facility])  Inpatient admission date: 11/01/2020      Primary Insurance Coverage  [employer] Subscriber ID  [id number]  Verified PCP: [name], MD   At [place] Prior to Admit:  [facility]  Verified Home Address:    [address]  [place] [address]   [phone]  Primary [place]  Family members tow brothers  Functional Limitations Prior to Admit  No Limitations  Physical/Cognitive/Functional History   Independent with ADL's and IADL's      Access to Healthcare Prior to Admit  No/minimal barriers to healthcare  Barriers to Discharge:  Other (working on blood sugar control and teach/ new diabetic)  Anticipated Discharge Date:  11/04/2020    Discharge Complexity Level     CCN Coordination:    Hospital Discharge Plan  Discharge to [facility].    Preferred Pharmacy for Discharge Meds:  [facility]     Discharge Transportation:  Discharge Transportation Plan: Private Ride  CCN Interventions     CCN Referrals  Diabetes Education  Resources Provided  PCP appointment

## 2020-11-03 NOTE — Nursing Note (Signed)
Patient Summary  4/14 Syncopal episode experienced by patient but able to call 911, medics found pt tachpyneic and hypoxic, placed on NRB, denies chest pain. Upon arrival to [facility], pt hypotensive with MAP < 65 mmgh.BS was elevated, found to be > 800 with elevated anion gap.  Patient in DKA.  Patient 1st troponin I was elevated, has NSTEMI, potentially from high metabolic demand but ACS is also still on differential.  Patient started on insulin gtt. Patient breathing and blood pressure better after 500 cc of LR x2 (1000 cc).  AKI likely from volume depletion.  Patient does not have history of diabetes but he does describe recent polydipsia (drinking lots of juice) and frequency.  Admit to MICU    4/15 Transitioned from insulin gtt to SUBQ insulin, PO intake good. D/c heparin gtt, start apixaban.     Pt A/Ox4, VSS, on RA. On Tele, NSR. ACHS, correctional given at bedtime. Uses urinal at bedside. SBA to bathroom when OOB. No BM overnight. Denies pain. Slept for most of the night.     Illness Severity  Stable

## 2020-11-03 NOTE — Progress Notes (Signed)
Telemetry Progress Note    Cardiac Rhythm Summary: Day shift and Abnormal QRS: 0.16    Rhythm: Sinus rhythm (SR)    Rate range: Low: 60's To High:100's    Ectopy: occasional PVC's     Ectopy : Frequent PAC's    Events: None  Rhythm changes during shift at times: No     Patient refused telemetry: No    Telemetry D/C'd at: No

## 2020-11-03 NOTE — Progress Notes (Signed)
Progress Note     Andros Channing") - DOB: 01/17/1951 [id number] year old male)  Preferred Pronouns: he/him/his  Admit Date: 11/01/2020  Code Status: Full Code       CHIEF CONCERN / IDENTIFICATION:  Mj Willis is a 70 year old man with gout and borderline DM2 who presented after a syncopal event, found to have submassive bilateral PEs.     SUBJECTIVE   INTERVAL HISTORY:  Breathing feels fine  No lightheadedness or dizziness  No numbness or tingling  Still having some pain in his left knee, believes it is a gout flare    Reports that his mother and brother have both been on insulin  Nursing reported that it was difficult for him to internalize the teaching they gave him about how and when to take insulin    SCHEDULED MEDICATIONS:     allopurinol, 200 mg, Daily    apixaban, 10 mg, q12h [place] **FOLLOWED BY** [START ON 11/09/2020] apixaban, 5 mg, q12h [place]    atorvastatin, 40 mg, Daily    colchicine, 0.6 mg, Once    colchicine, 1.2 mg, Once    insulin GLARGINE, 26 units, q AM    insulin LISPRO, 0-12 units, TID before meals    insulin LISPRO, 0-4 units, q HS    insulin LISPRO, 10 units, TID before meals    INFUSED MEDICATIONS:       PRN MEDICATIONS:  dextrose, 125 mL, PRN    glucagon, 0.5 mg, PRN    glucagon, 1 mg, PRN    ondansetron, 4 mg, q8h PRN    ondansetron, 4 mg, q8h PRN    perflutren lipid microsphere (Definity) in NS injection, 1-10 mL, Once PRN    senna, 17.2 mg, BID PRN       OBJECTIVE     Vitals (Most recent in last 24 hrs)     T: (P) 37.1 C (11/03/20 0700)  BP: (P) 133/74 (11/03/20 0700)  HR: (P) 69 (11/03/20 0700)  RR: (P) 16 (11/03/20 0700)  SpO2: (P) 97 % (11/03/20 0700) Room air  T range: Temp  Min: 36 C  Max: 37.1 C  Admit weight: (!) 107 kg (235 lb 14.3 oz) (11/01/20 2014)  Last weight: (!) 107.5 kg (236 lb 15.9 oz) (11/03/20 0600)       I&Os:     Intake/Output Summary (Last 24 hours) at 11/03/2020 0851  Last data filed at 11/03/2020 0533  Intake 2165.84 ml   Output 875 ml    Net 1290.84 ml     Physical Exam  Gen: awake and alert, reclining in bed  HEENT: pupils equal, anicteric sclerae, MMM  CV: RRR no mrg  Pulm: coarse breath sounds bilaterally, good air movement, normal respiratory effort, no crackles or wheezes  Abd: normal BT soft NTND  Extrem: mild tenderness over joint line L knee, no redness or swelling, trace peripheral edema  Skin: warm and dry no rash visible  Neuro/psych: normal affect and orientation, MAE.       Labs (last 24 hours):   Chemistries  CBC  LFT  Gases, other   140 106 19 210   13.7   AST: - ALT: -  -/-/-/-   -/-/-/-   3.6 26 1.40   4.55 >< 137  AP: - T bili: -  Lact (a): - Lact (v): -   eGFR: 51 Ca: 8.3   41   Prot: - Alb: -  Trop I: - D-dimer: -   Mg:  1.6 PO4: 3.7  ANC: -     BNP: - Anti-Xa: -     ALC: -    INR: -           Reviewed Results? Independently visualized & interpreted? Key Findings     Lab _0  _1  Creatinine improving   Radiology _2  _3     EKG/Tele/Echo  _4  _5     Other?  _6  _7             ASSESSMENT/PLAN      Kayd Launer is a 70 year old man with gout and borderline DM2 admitted 4/14 after a syncopal event, found to have submassive bilateral PEs and hyperglycemic hyperosmolar syndrome.     #Syncope   #Submassive bilateral PE  #NSTEMI   #RV dysfunction   #Hypoxemic respiratory failure: resolved   #Shock: resolved  On presentation to the ED, he was noted to have elevated troponin, low blood pressure, elevated lactate. He was admitted to the MICU. RV dysfucntion noted on bedside ultrasound concerning for PEs. CTPE showed "large pulmonary arterial embolic burden involving all lobar arteries of the bilateral lungs". No known risk factors. Had a negative FIT 07/2019. He was treated with half dose TPA on 04/14. His hemodynamics improved, now satting well on RA, shock resolved, and troponin down trending. Cardiology was consulted, thought NSTEMI thought to be 2/2 submassive PE and not likely ACS. He was started on apixaban 6m BID on 4/15.  -  continue apixaban 161mbid through 4/22   Then apixaban 81m13mid thereafter  - f/u with PCP to determine duration of anticoagulation, likely 6 mo minimum    #HHS  #T2DM  Presented with elevated blood sugar leves in the 800s. He was not on any outpatient medications for DM. His HgA1c was 6.5 in 2020 then 7.5 in 2021 but it doesn't appear he followed up after that last result. Clinical picture more consistent with HHS and he was given 3L fluid bolus. He was alo started on insulin gtt and was then transitioned to subq insulin. Slowly improving with uptitration of insulin dosing down to 200s from 300s. He is willing to do insulin at home but RN doing teaching with him noted that he has not yet been able to internalize the instructions of when and how to take his insulin. Confused about the difference between short and long-acting and when to time them.   - increase qam glargine to 26u from 20u   - continue 10units TID with premeal   - HDSSI   - DM education   - nutrition consult  - glycemic team consulted for new insulin  - might do better with single long-acting or BID NPH plus metformin in the long run     #Gout  Hx of gout flares in the LEs. Reports some tenderness in the left knee with tenderness over the joint line on exam 4/16. Ddx includes smoldering gout flare, OA. Usually takes colchicine at home for gout.  - start colchicine 1.2mg69m then 0,6mg 42mafter  - continue allopurinol     #AKI: resolving.  Probably volume depletion with HHS. Creatinine 2.3 on admission. Baseline of 1.3. Currently 1.4 after fluid resuscitation.  - daily BMP    Resolved:  #AGMA   #Lactic acidosis  #Metabolic alkalosis       Inpatient Checklist:    Fluids/Electrolytes/Nutrition: carb managed diet   Prophylaxis: full ac  Lines/Drains/Airways: PIV  Disposition: anticipate d/c to home on Monday if his glucoses are well  controlled and he has seen glycemic team  Code Status: Full Code    Contacts: Primary Emergency Contact: brother  Deaveon, [place], Home Phone: [phone]

## 2020-11-03 NOTE — Nursing Note (Signed)
Patient Summary  4/14 Syncopal episode experienced by patient but able to call 911, medics found pt tachpyneic and hypoxic, placed on NRB, denies chest pain. Upon arrival to [facility], pt hypotensive with MAP < 65 mmgh.BS was elevated, found to be > 800 with elevated anion gap.  Patient in DKA.  Patient 1st troponin I was elevated, has NSTEMI, potentially from high metabolic demand but ACS is also still on differential.  Patient started on insulin gtt. Patient breathing and blood pressure better after 500 cc of LR x2 (1000 cc).  AKI likely from volume depletion.  Patient does not have history of diabetes but he does describe recent polydipsia (drinking lots of juice) and frequency.  Admit to MICU    4/16 - A&Ox4, VSS Tele SR. Mild pain in L knee on gout meds. Pt has new dx of diabetes type 2 and plans to discharge on insulin this weekend. RN attempted to do some insulin teaching with patient and he had a difficult time understanding when to take his insulin. Kaylor did state that he does like eat meals at home but more snacks throughout the day. RN reached out to Dr. Randol Kern to push back discharge until Monday to receive formal education with the glycemic team or discharge on only glargine and oral agent and follow up with endo outpt. Pt up ad lib in room. BRP.   Edited by: Remigio Eisenmenger at 11/03/2020 1505    Illness Severity  Stable  Edited byMarland Kitchen Holly Bodily, RN at 11/02/2020 [phone]

## 2020-11-03 NOTE — Progress Notes (Signed)
Telemetry Progress Note    Admitted to telemetry this shift at:  1827    Cardiac Rhythm Summary: Night shift, Abnormal QRS: 0.16 and Abnormal Qtc: 0.46    Rhythm: Sinus rhythm (SR)    Rate range: Low: 60 To High: 90    Ectopy: Frequent PVC's and Aberrancy                Occasional PAC's

## 2020-11-04 ENCOUNTER — Other Ambulatory Visit (HOSPITAL_BASED_OUTPATIENT_CLINIC_OR_DEPARTMENT_OTHER): Payer: Self-pay

## 2020-11-04 DIAGNOSIS — E119 Type 2 diabetes mellitus without complications: Secondary | ICD-10-CM

## 2020-11-04 LAB — LAB ADD ON ORDER

## 2020-11-04 LAB — GLUCOSE POC, HMC
Glucose (POC): 204 mg/dL — ABNORMAL HIGH (ref 62–125)
Glucose (POC): 364 mg/dL — ABNORMAL HIGH (ref 62–125)
Glucose (POC): 368 mg/dL — ABNORMAL HIGH (ref 62–125)
Glucose (POC): 310 mg/dL — ABNORMAL HIGH (ref 62–125)
Glucose (POC): 227 mg/dL — ABNORMAL HIGH (ref 62–125)
Glucose (POC): 247 mg/dL — ABNORMAL HIGH (ref 62–125)

## 2020-11-04 LAB — BASIC METABOLIC PANEL
Anion Gap: 8 (ref 4–12)
Calcium: 8.2 mg/dL — ABNORMAL LOW (ref 8.9–10.2)
Carbon Dioxide, Total: 25 meq/L (ref 22–32)
Chloride: 104 meq/L (ref 98–108)
Creatinine: 1.3 mg/dL — ABNORMAL HIGH (ref 0.51–1.18)
Glucose: 222 mg/dL — ABNORMAL HIGH (ref 62–125)
Potassium: 3.6 meq/L (ref 3.6–5.2)
Sodium: 137 meq/L (ref 135–145)
Urea Nitrogen: 17 mg/dL (ref 8–21)
eGFR by CKD-EPI: 56 mL/min/{1.73_m2} — ABNORMAL LOW (ref >59)

## 2020-11-04 LAB — CBC (HEMOGRAM)
Hematocrit: 39 % (ref 38.0–50.0)
Hemoglobin: 13.2 g/dL (ref 13.0–18.0)
MCH: 29.1 pg (ref 27.3–33.6)
MCHC: 33.8 g/dL (ref 32.2–36.5)
MCV: 86 fL (ref 81–98)
Platelet Count: 145 10*3/uL — ABNORMAL LOW (ref 150–400)
RBC: 4.53 10*6/uL (ref 4.40–5.60)
RDW-CV: 12.6 % (ref 11.6–14.4)
WBC: 4.43 10*3/uL (ref 4.3–10.0)

## 2020-11-04 LAB — MAGNESIUM: Magnesium: 1.6 mg/dL — ABNORMAL LOW (ref 1.8–2.4)

## 2020-11-04 MED ORDER — METFORMIN HCL 500 MG OR TABS
500.0000 mg | ORAL_TABLET | Freq: Every day | ORAL | Status: DC
Start: 2020-11-04 — End: 2020-11-05
  Administered 2020-11-04 – 2020-11-05 (×2): 500 mg via ORAL
  Filled 2020-11-04 (×2): qty 1

## 2020-11-04 MED ORDER — HUMULIN 70/30 KWIKPEN (70-30) 100 UNIT/ML SC SUPN
PEN_INJECTOR | SUBCUTANEOUS | 0 refills | Status: DC
Start: 2020-11-04 — End: 2020-11-22
  Filled 2020-11-04: qty 21, 28d supply, fill #0

## 2020-11-04 MED ORDER — INSULIN PEN NEEDLE 31G X 8 MM MISC
1.0000 | Freq: Two times a day (BID) | 0 refills | Status: DC
Start: 2020-11-04 — End: 2020-12-07
  Filled 2020-11-04: qty 100, 50d supply, fill #0

## 2020-11-04 MED ORDER — INSULIN REGULAR HUMAN 100 UNIT/ML IJ SOLN
14.0000 [IU] | Freq: Every morning | INTRAMUSCULAR | Status: DC
Start: 2020-11-05 — End: 2020-11-05
  Administered 2020-11-05: 14 [IU] via SUBCUTANEOUS
  Filled 2020-11-04: qty 14

## 2020-11-04 MED ORDER — INSULIN REGULAR HUMAN 100 UNIT/ML IJ SOLN
7.0000 [IU] | INTRAMUSCULAR | Status: DC
Start: 2020-11-05 — End: 2020-11-05

## 2020-11-04 MED ORDER — INSULIN NPH (HUMAN) (ISOPHANE) 100 UNIT/ML SC SUSP
17.0000 [IU] | SUBCUTANEOUS | Status: DC
Start: 2020-11-05 — End: 2020-11-05

## 2020-11-04 MED ORDER — MAGNESIUM SULFATE 2 GM/50ML SWFI IV SOLN
2.0000 g | Freq: Once | INTRAVENOUS | Status: AC
Start: 2020-11-04 — End: 2020-11-04
  Administered 2020-11-04: 2 g via INTRAVENOUS
  Filled 2020-11-04: qty 50

## 2020-11-04 MED ORDER — INSULIN NPH (HUMAN) (ISOPHANE) 100 UNIT/ML SC SUSP
34.0000 [IU] | Freq: Every morning | SUBCUTANEOUS | Status: DC
Start: 2020-11-05 — End: 2020-11-05
  Administered 2020-11-05: 34 [IU] via SUBCUTANEOUS
  Filled 2020-11-04: qty 34

## 2020-11-04 NOTE — Nursing Note (Signed)
Illness Severity  Stable      Patient Summary  4/14 Syncopal episode experienced by patient but able to call 911, medics found pt tachpyneic and hypoxic, placed on NRB, denies chest pain. Upon arrival to [facility], pt hypotensive with MAP < 65 mmgh.BS was elevated, found to be > 800   with elevated anion gap.  Patient in DKA.  Patient 1st troponin I was elevated, has NSTEMI, potentially from high metabolic demand but ACS is also still on differential.  Patient started on insulin gtt. Patient breathing and blood   pressure better after 500 cc of LR x2 (1000 cc).  AKI likely from volume depletion.  Patient does not have history of diabetes but he does describe recent polydipsia (drinking lots of juice) and frequency.  Admit to MICU    A&Ox4, VSS NSR on tele. Endorses mild discomfort to L knee, pt on gout meds. ACHS/SSI - correction insulin given. Newly diagnose DM - health teachings given. Denies chest pain/SOB. Up ad lib. Voids with urinal. Turns self.

## 2020-11-04 NOTE — Nursing Note (Signed)
Illness Severity  Stable      Patient Summary  4/14 Syncopal episode experienced by patient but able to call 911, medics found pt tachpyneic and hypoxic, placed on NRB, denies chest pain. Upon arrival to [facility], pt hypotensive with MAP < 65 mmgh.BS was elevated, found to be > 800   with elevated anion gap.  Patient in DKA.  Patient 1st troponin I was elevated, has NSTEMI, potentially from high metabolic demand but ACS is also still on differential.  Patient started on insulin gtt. Patient breathing and blood   pressure better after 500 cc of LR x2 (1000 cc).  AKI likely from volume depletion.  Patient does not have history of diabetes but he does describe recent polydipsia (drinking lots of juice) and frequency.  Admit to MICU    Tele d/c'd. Pt educated on insulin regimen, however, needs more assistance. MD aware. Pt had eaten 100% of breakfast, however, mentioned he had two fruit cups, crackers and cheese as a snack before lunch. His BS was 364, 12U correction   given w/recheck down to 310. Pt received nutritional insulin prior to lunch. Also started on Metformin. Educated on mealtime and snack options, checking BS, and signs of high/low BS. Pending d/c plan and visit from glycemic team tomorrow.

## 2020-11-04 NOTE — Progress Notes (Signed)
Progress Note     Nathan [name]") - DOB: 1950/10/[phone] year old male)  Preferred Pronouns: he/him/his  Admit Date: 11/01/2020  Code Status: Full Code       CHIEF CONCERN / IDENTIFICATION:  Nathan [name] is a 70 year old man with gout and borderline DM2 who presented after a syncopal event, found to have submassive bilateral PEs.       SUBJECTIVE   INTERVAL HISTORY:  Feels better overall  Breathing feels fine  Left knee pain has essentially resolved, no longer limping when he walks    Still working on understanding the difference between short acting and long acting insulin as well as how to manage his food intake and activity on insulin.     We went over the difference between short and long acting insulin multiple times but still hard for him to grasp.     SCHEDULED MEDICATIONS:     allopurinol, 200 mg, Daily    apixaban, 10 mg, q12h [place] **FOLLOWED BY** [START ON 11/09/2020] apixaban, 5 mg, q12h [name]    atorvastatin, 40 mg, Daily    insulin GLARGINE, 26 units, q AM    insulin LISPRO, 0-12 units, TID before meals    insulin LISPRO, 0-4 units, q HS    insulin LISPRO, 10 units, TID before meals    INFUSED MEDICATIONS:       PRN MEDICATIONS:  dextrose, 125 mL, PRN    glucagon, 0.5 mg, PRN    glucagon, 1 mg, PRN    ondansetron, 4 mg, q8h PRN    ondansetron, 4 mg, q8h PRN    perflutren lipid microsphere (Definity) in NS injection, 1-10 mL, Once PRN    senna, 17.2 mg, BID PRN       OBJECTIVE     Vitals (Most recent in last 24 hrs)     T: 36.5 C (11/04/20 0746)  BP: 122/80 (11/04/20 0746)  HR: 64 (11/04/20 0746)  RR: 16 (11/04/20 0746)  SpO2: 96 % (11/04/20 0746) Room air  T range: Temp  Min: 36.4 C  Max: 37.2 C  Admit weight: (!) 107 kg (235 lb 14.3 oz) (11/01/20 2014)  Last weight: (!) 109.5 kg (241 lb 6.5 oz) (11/04/20 0610)       I&Os:     Intake/Output Summary (Last 24 hours) at 11/04/2020 0947  Last data filed at 11/04/2020 0450  Intake 480 ml   Output 500 ml   Net -20 ml        Physical Exam  Gen: awake and alert, lying in bed  HEENT: pupils equal, anicteric sclerae, MMM  CV: RRR no mrg  Pulm: coarse breath sounds bilaterally, good air movement, normal respiratory effort, L base expiratory wheeze resolved with deep breathing.   Abd: normal BT soft NTND  Extrem: no edema, full ROM L knee  Skin: warm and dry no rash visible  Neuro/psych: normal affect and orientation, MAE.     Labs (last 24 hours):   Chemistries  CBC  LFT  Gases, other   137 104 17 204   13.2   AST: - ALT: -  -/-/-/-   -/-/-/-   3.6 25 1.30   4.43 >< 145  AP: - T bili: -  Lact (a): - Lact (v): -   eGFR: 56 Ca: 8.2   39   Prot: - Alb: -  Trop I: - D-dimer: -   Mg: 1.6 PO4: -  ANC: -     BNP: -  Anti-Xa: -     ALC: -    INR: -      Glucoses 200s-300s       Reviewed Results? Independently visualized & interpreted? Key Findings     Lab _0  _1  Still hyperglycemic. Mag low   Radiology _2  _3     EKG/Tele/Echo  _4  _5     Other?  _6  _7             ASSESSMENT/PLAN      Nathan [name] is a 69 year old man with gout and borderline DM2 admitted 4/14 after a syncopal event, found to have submassive bilateral PEs and hyperglycemic hyperosmolar syndrome with newly diagnosed diabetes.     #Syncope   #Submassive bilateral PE  #NSTEMI   #RV dysfunction   #Hypoxemic respiratory failure: resolved   #Shock: resolved  On presentation to the ED, he was noted to have elevated troponin, low blood pressure, elevated lactate. He was admitted to the MICU. RV dysfucntion noted on bedside ultrasound concerning for PEs. CTPE showed "large pulmonary arterial embolic burden involving all lobar arteries of the bilateral lungs". No known risk factors. Had a negative FIT 07/2019. He was treated with half dose TPA on 04/14. His hemodynamics improved, now satting well on RA, shock resolved, and troponin down trending. Cardiology was consulted, thought NSTEMI thought to be 2/2 submassive PE and not likely ACS. He was started on apixaban 64m BID on 4/15.  -  continue apixaban 116mbid through 4/21               Then apixaban 20m23mid thereafter  - f/u with PCP to determine duration of anticoagulation, likely 6 mo minimum  - d/c tele given stability since admission    #HHS  #T2DM  Presented with elevated blood sugar leves in the 800s. He was not on any outpatient medications for DM. His HgA1c was 6.5 in 2020 then 7.5 in 2021 but it doesn't appear he followed up after that last result. Clinical picture more consistent with HHS and he was given 3L fluid bolus. He was also started on insulin gtt and was then transitioned to subq insulin. Glucoses aren't optimally controlled but at this point most concerned with just keeping him safe. He is willing to do insulin at home but RN doing teaching with him and in my visit today noted that he has not yet been able to internalize the instructions of when and how to take his insulin. Confused about the difference between short and long-acting and when to time them. I consulted Endocrinology who recommended bid 70/30 plus metformin to start for ease of dosing.   - s/p glargine 26u this am; d/c now  - start NPH 34u plus regular 14u  qam and 17u NPH plus 7u regular  qpm to mimic 70/30 48u and 24u as recommended by Endo (70/30 not available in the hospital)  - continue 10units TID with meals, d/c after dinner tonight given above regimen  - start metformin 500m7m daily x 7d then increase to bid  - nutrition consult  - continue diabetic teaching  - f/u with diabetes clinic in 1 week    #Gout  Hx of gout flares in the LEs. Reported some tenderness in theleftknee with tenderness over the joint line on exam 4/16. Ddx includes smoldering gout flare, OA. Usually takes colchicine at home for gout so was given colchicine 1.2mg 64mthen 0,6mg 164mfter with significant improvement in his symptoms.   - continue allopurinol     #  AKI: resolving.  Probably volume depletion with HHS. Creatinine 2.3 on admission. Back to baseline 1.3 after fluid  resuscitation.  - daily BMP    #Hypomagnesemia  1.6 today. Don't want to give PO as he is also starting metformin which could give GI distress.  - replete 2g IV now    Resolved:  #AGMA   #Lactic acidosis  #Metabolic alkalosis      Inpatient Checklist:  Fluids/Electrolytes/Nutrition:carb managed diet  Prophylaxis:full ac  Lines/Drains/Airways:PIV  Disposition:anticipate d/c to home on Monday after additional insulin teaching  Code Status:Full Code  Contacts: Primary Emergency Contact: brother Chandon, [place], Home Phone: [phone].  I tried calling Legrand Como several times tonight to ask if he would be able to support his brother with getting used to insulin Legrand Como is on insulin per the pt) but the call wouldn't go through despite several attempts.

## 2020-11-04 NOTE — Progress Notes (Signed)
Telemetry Progress Note    Admitted to telemetry this shift at:      Cardiac Rhythm Summary: Day shift    Rhythm: Sinus rhythm (SR) and Sinus bradycardia (SB)    Rate exact: Low: 52 To High: 88    Rate range: Low: 60 To High: 70    Frequent: PVC    Telemetry D/C'd at: 1026    Additional Information:

## 2020-11-04 NOTE — Progress Notes (Signed)
Telemetry Progress Note    Cardiac Rhythm Summary: Night shift, Abnormal QRS: 0.17 and Abnormal Qtc: 0.47    Rhythm: Sinus rhythm (SR) and Sinus bradycardia (SB)    Rate range: Low: 50 To High: 90    Ectopy: Occasional PVC's               Rare PAC's

## 2020-11-04 NOTE — Nursing Note (Signed)
Patient Summary  4/14 Syncopal episode experienced by patient but able to call 911, medics found pt tachpyneic and hypoxic, placed on NRB, denies chest pain. Upon arrival to [facility], pt hypotensive with MAP < 65 mmgh.BS was elevated, found to be > 800 with elevated anion gap.  Patient in DKA.  Patient 1st troponin I was elevated, has NSTEMI, potentially from high metabolic demand but ACS is also still on differential.  Patient started on insulin gtt. Patient breathing and blood pressure better after 500 cc of LR x2 (1000 cc).  AKI likely from volume depletion.  Patient does not have history of diabetes but he does describe recent polydipsia (drinking lots of juice) and frequency.  Admit to MICU    Pt A/Ox4, VSS, afebrile. Denies pain, appears comfortable. BS cont to be high this shift - correction and nutritional insulin given. Multiple DM  teaching sessions done this shift - pt is having a very challenging time retaining and comprehending the information. Literacy level seems to be low so adding supporting written materials has not been beneficial. Med team notified that patient will need much more extensive DM education as well as a very simple home regimen to follow. Will cont to monitor.    Illness Severity  Stable  Edited byHolly Bodily, RN at 11/02/2020 1601

## 2020-11-04 NOTE — Progress Notes (Signed)
Anticoagulation Education - DOAC    Primary learner(s): Patient    Interpreter was present: No    Direct oral anticoagulant: apixaban    Dosing regimen: Apixaban 10 mg BID until 4/22 then 5 mg bid, likely 6 mo minimum.    Indication: VTE treatment (PE)    Factors influencing anticoagulation (e.g. renal dysfunction, drug interactions, etc): none    Education materials discussed and provided: Yes, DOAC handout.     DOAC Discussion Topics Included:   Rationale for therapy   Basic explanation of how medication works   Dosing and the importance of taking as instructed   Adverse effects including signs and symptoms of bleeding   Signs and symptoms of venous/pulmonary thromboembolism   Drug interactions    Medication cost/insurance coverage     Post-education response: States understanding    Doyne Keel, Pharmacy Student

## 2020-11-04 NOTE — Consults (Signed)
Brief Nutrition Note    Received consult for Diabetes education. Pt awake/alert, amenable for interview. Good appetite at this time, no concerns for n/v. Pt initially appeared not engaged when RD introduced self, though started to further discuss concerns and home regimen after RD explained purpose of visit. PO intake at baseline: skips breakfast, first meal typically at 12-12:30pm which consists of fruit and/or cold sandwich, often goes hours before eating dinner at home; occasionally has snacks (e.g. cheese sandwich crackers, pastry). Pt continued to share long periods of not eating while he was not at home, enjoys fishing. During visit, pt began discussing different types of bread he has tried in the past (e.g. whole wheat, 12-grain, sour dough) and previous c/f having to stop eating whole wheat d/t increase in pores on his face?    Pt initially did appear open to reviewing literature provided today. Handouts given include: Carb counting for people with Diabetes, Meal Plan basics, and Using nutrition labels. Pt unable to recall if he received previous diet education, reports if he did likely ignored it. Pt unable to provide examples of carbohydrate food items. Attempts tried during visit to discuss common examples of carbohydrate food items, how to incorporate balanced meal (using Meal Plan basics), portion sizes, and timing of meals. Pt not yet able to internalize the instructions of diet. Encouraged pt to review multiple handouts today, nutrition services will follow up tomorrow (4/18) if/when able to further answer/clarify questions and concerns. Please refer to outpatient nutrition services for follow up.    Full nutrition assessment deferred today, note plan for discharge tomorrow.     ---  Karna Christmas, MPH, [place]  Clinical Dietitian, Nutrition Services  Nutrition Consult Line: [phone]

## 2020-11-05 ENCOUNTER — Encounter (HOSPITAL_COMMUNITY): Payer: Self-pay

## 2020-11-05 ENCOUNTER — Other Ambulatory Visit (HOSPITAL_BASED_OUTPATIENT_CLINIC_OR_DEPARTMENT_OTHER): Payer: Self-pay

## 2020-11-05 DIAGNOSIS — M109 Gout, unspecified: Secondary | ICD-10-CM

## 2020-11-05 DIAGNOSIS — J9691 Respiratory failure, unspecified with hypoxia: Secondary | ICD-10-CM

## 2020-11-05 DIAGNOSIS — I2699 Other pulmonary embolism without acute cor pulmonale: Secondary | ICD-10-CM | POA: Diagnosis present

## 2020-11-05 LAB — 1ST EXTRA LIME GREEN TOP

## 2020-11-05 LAB — CBC (HEMOGRAM)
Hematocrit: 43 % (ref 38.0–50.0)
Hemoglobin: 14.3 g/dL (ref 13.0–18.0)
MCH: 29.1 pg (ref 27.3–33.6)
MCHC: 32.9 g/dL (ref 32.2–36.5)
MCV: 88 fL (ref 81–98)
Platelet Count: 189 10*3/uL (ref 150–400)
RBC: 4.92 10*6/uL (ref 4.40–5.60)
RDW-CV: 12.9 % (ref 11.6–14.4)
WBC: 4.68 10*3/uL (ref 4.3–10.0)

## 2020-11-05 LAB — HEMOGLOBIN A1C, HPLC
Hemoglobin A1C: 17.1 % — ABNORMAL HIGH (ref 4.0–6.0)
Hemoglobin A1C: 17.5 % — ABNORMAL HIGH (ref 4.0–6.0)

## 2020-11-05 LAB — GLUCOSE POC, HMC
Glucose (POC): 248 mg/dL — ABNORMAL HIGH (ref 62–125)
Glucose (POC): 234 mg/dL — ABNORMAL HIGH (ref 62–125)

## 2020-11-05 MED ORDER — APIXABAN 5 MG OR TABS
10.0000 mg | ORAL_TABLET | Freq: Two times a day (BID) | ORAL | 0 refills | Status: DC
Start: 2020-11-05 — End: 2021-05-13
  Filled 2020-11-05: qty 66, 30d supply, fill #0

## 2020-11-05 MED ORDER — METFORMIN HCL 500 MG OR TABS
500.0000 mg | ORAL_TABLET | Freq: Every day | ORAL | 3 refills | Status: DC
Start: 2020-11-06 — End: 2021-05-13
  Filled 2020-11-05: qty 56, 30d supply, fill #0

## 2020-11-05 MED ORDER — APIXABAN 5 MG OR TABS
5.0000 mg | ORAL_TABLET | Freq: Two times a day (BID) | ORAL | 3 refills | Status: DC
Start: 2020-11-09 — End: 2020-11-05
  Filled 2020-11-05: qty 60, 30d supply, fill #0

## 2020-11-05 NOTE — Progress Notes (Signed)
Inpatient Adult Nutrition Brief Note    Another brief visit for inpatient diabetes diet counseling. Pt recalled that vegetables and proteins are not significant carbohydrate sources, and that it is important to control the portion sizes of carbohdydrates, but did have trouble identifying carbohydrate sources. We reviewed carb sources again. Pt was also practicing blood sugar checks with RN at this time and had a few things going on at once. Pt endorsed no further questions at this time, and thinks he has an appointment on Tuesday for more diabetes education - which would be highly recommended.    _______________________  Babs Bertin, [place], [place] (weekends/after hours): [phone] (voicemail only)

## 2020-11-05 NOTE — Consults (Signed)
Glycemic Stewardship:  Insulin and Glucometer education    CHIEF CONCERN / IDENTIFICATION:  Nathan [name] is a 70 year old man with gout and borderline DM2 who presented after a syncopal event, found to have submassive bilateral PEs.  Patient also found to be in HHS on admit.  A1C from yesterday morning was 17.5;    I was asked to see this patient for continued education on insulin use and glucometer/BG testing at home.  Patient has had learning challenges with these topics.    Monitoring and medications:  I met with patient to reinforce his insulin regimen - Humulin 70/30  48u before breakfast and 24u before dinner.  I did insulin pen demo with return demo, patient was able to demonstrate with reinforcement.  We then did hands on demo and return demo with the home glucometer.  Nurse also checked his BG before lunch using the home lancing device.  Patient was instructed to check BG TID, before morning and dinner insulin and at bedtime.  I also gave patient hard copy of schedule.    I met with patient again when his brother Nathan [name] came to pick him up, reinforced above information and had patient demonstrate pen use to brother.  Was able to do this without prompting.  He was also able to repeat his insulin and BG regimen from memory.    Patient will come to [facility] drop in clinic tomorrow at 1245.  Thank you to the nursing staff for their excellent  diabetes teaching over the weekend,   This has definitely contributed to improved patient success with his diabetes management with ongoing follow up.

## 2020-11-05 NOTE — Nursing Note (Signed)
Patient Summary  4/14 Syncopal episode experienced by patient but able to call 911, medics found pt tachpyneic and hypoxic, placed on NRB, denies chest pain. Upon arrival to [facility], pt hypotensive with MAP < 65 mmgh.BS was elevated, found to be > 800 with elevated anion gap.  Patient in DKA.  Patient 1st troponin I was elevated, has NSTEMI, potentially from high metabolic demand but ACS is also still on differential.  Patient started on insulin gtt. Patient breathing and blood pressure better after 500 cc of LR x2 (1000 cc).  AKI likely from volume depletion.  Patient does not have history of diabetes but he does describe recent polydipsia (drinking lots of juice) and frequency.  Admit to MICU    Assumed care at 2330, pt slept throughout the night.  Able to make needs known.  Pending d/c plan and visit from glycemic team tomorrow.   Edited by: Alvester Morin, RN at 11/05/2020 0422    Illness Severity  Stable  Edited byHolly Bodily, RN at 11/02/2020 1601

## 2020-11-05 NOTE — Discharge Summary (Signed)
Discharge Summary     Nathan [name]") - DOB: 1950-08-15 70 year old male)  Preferred Pronouns: he/him/his  PCP: Nathan Byars, MD   Code Status: Needs Review        DATE OF ADMISSION: 11/01/2020  DATE OF DISCHARGE: 11/05/2020  DISCHARGE TEAM & ATTENDING: Med Hospitalist 2 Adult & Nathan [name], *     ADMISSION DIAGNOSIS: syncope  DISCHARGE DIAGNOSIS:   Syncope  Submassive bilateral pulmonary emboli  Hypoxemic respiratory failure, resolved  NSTEMI secondary to right heart   Hyperglycemic hyperosmolar syndrome  Newly diagnosed diabetes mellitus  Acute kidney injury  Left knee gout flare      DISCHARGE FOLLOW-UP VISITS/APPOINTMENTS:    Upcoming appointments at [facility]:  Future Appointments   Date Time Provider [place]   11/26/2020  2:30 PM Nathan Byars, MD Dagmar [facility] AM        Additional follow-up:  [facility]  [place]  [phone]  Follow up on 11/06/2020  This is a walk in clinic that opens at 12:45 on Tuesdays. Please bring your glucometer and all medications to this visit.      PENDING RESULTS THAT REQUIRE FOLLOW-UP (as of this summary):  Pending Labs     Order Current Status    Blood Gas, Venous Collected (11/01/20 2025)    Blood Gas, Venous Collected (11/01/20 2025)    Blood Culture, Peripheral Preliminary result          DIAGNOSTIC STUDIES RECOMMENDED:  Age appropriate cancer screening, consider hypercoagulable workup    INCIDENTAL FINDINGS THAT REQUIRE FOLLOW-UP:   None      THERAPEUTIC RECOMMENDATIONS:   See plan below    ALLERGIES:  Patient has no known allergies.      DISCHARGE MEDICATIONS:   Discharge Medication List as of 11/05/2020  2:33 PM      START taking these medications    Details   glucometer (OneTouch Verio Flex System) w/Device kit Use to check blood sugar 4 times a day.Disp-1 kit, R-0, Normal      blood glucose test strip 1 strip 4 times a day. Use to check blood sugar.Disp-100 strip, R-0,  Normal      insulin NPH & REGULAR (HumuLIN 70/30 KwikPen) (70-30) 100 UNIT/ML pen-injector Inject 48 units under the skin every morning before breakfast AND 24 units every evening before dinner.Disp-21 mL, R-0, Normal      lancet (OneTouch Delica Plus) 62G Use 1 to check blood glucose 4 times a day.Disp-100 each, R-0, Normal      metFORMIN 500 MG tablet Take 1 tablet (500 mg) by mouth ONCE DAILY every morning through Thursday 11/08/20 and then take [place] starting 11/09/20 ongoing.Disp-60 tablet, R-3, Normal      pen needle 31G X 8 MM Use to inject insulin 2 times a day.Disp-100 each, R-0, Normal         CONTINUE these medications which have CHANGED    Details   apixaban 5 MG tablet Take 2 tablets (10 mg) by mouth every 12 hours through the end of Thursday 11/08/20, then on Friday 11/09/20, start taking 1 tablet (5 mg) by mouth 2 times daily, take ongoing until stopped by your primary doctor.Disp-66 tablet, R-0, Normal         CONTINUE these medications which have NOT CHANGED    Details   Albuterol Sulfate HFA 108 (90 Base) MCG/ACT Inhalation Aero Soln Inhale 2 puffs  6 hours as needed for shortness of breath/wheezing.Disp-1 Inhaler, R-6, E-RX      allopurinol 100 MG tablet Take 2 tablets (200 mg) by mouth daily.Disp-180 tablet, R-1, Normal      atorvastatin 40 MG tablet Take 1 tablet (40 mg) by mouth every evening. To lower cholesterol. (past due for annual exam)Disp-90 tablet, R-0, Normal      colchicine 0.6 MG tablet Take 2 tablets by mouth at first sign of flare followed by 1 table 1 hour laterDisp-30 tablet, R-0, E-RX      Fluticasone Propionate HFA 110 MCG/ACT Inhalation Aerosol Inhale 1 puff by mouth every 12 hours.Disp-1 Inhaler, R-6, E-RXTherapeutic substitution for arnuity      ketoconazole 2 % shampoo Apply to face and scalp as neededDisp-120 mL, R-3, NormalPlease remind patient to keep 07/11/19 appt, must be seen for future refills      loratadine 10 MG tablet Take 1 tablet (10 mg) by  mouth daily as needed for allergies. (past due for annual exam/follow up appointment)Disp-30 tablet, R-2, Normal      Triamcinolone Acetonide 0.1 % External Ointment Apply to affected area on neck 2 times a day., Disp-15 g, R-0, E-RX             BRIEF ADMISSION HISTORY:   Per MICU Admit Note by Dr. Cooper [name]  [name] says that he has had a dry mouth, white tongue, and blurry vision for a couple of months. He had been trying to drink lots of juice to help with his dry mouth but he was unable to keep up.      [name] says that this morning he was profoundly thirsty and tried drinking juice to make it better. He walked to the bathroom and then fell, he isn't sure if he lost consciousness but he ended up on the floor. He called 9-1-1 at that point. He was able to get himself up from the floor after falling to let EMS into his apartment. EMS found him to be tachypneic to the 40s and placed him on a non-rebreather.      On arrival to the [facility] ER his initial vitals were HR 107, BP 76/65, RR 28, SpO2 98% on 15L oxygen. His initial labs showed a glucose of >600, BMP with AGAP of 19, lactate of 8.3, osm 330, HBH+, initial troponin of 0.17. His ECG showed a bifascicular block, no T wave inversions or ST changes.         HOSPITAL COURSE:   Nathan [name] is a 70 year old man with gout and borderline DM2 admitted 4/14 after a syncopal event, found to have submassive bilateral PEs and hyperglycemic hyperosmolar syndrome.      #Syncope   #Submassive bilateral PE  #NSTEMI   #RV dysfunction   #Hypoxemic respiratory failure: resolved   #Shock: resolved  On presentation to the ED, he was noted to have elevated troponin, low blood pressure, elevated lactate. He was admitted to the MICU. RV dysfucntion noted on bedside ultrasound concerning for PEs. CTPE showed "large pulmonary arterial embolic burden involving all lobar arteries of the bilateral lungs". No known risk factors. Had a negative FIT 07/2019. He was treated with  half dose TPA on 04/14. His hemodynamics improved, was satting well on RA, shock resolved, and troponin down-trending. Cardiology was consulted, thought NSTEMI thought to be 2/2 submassive PE and not likely ACS. He was started on apixaban 10mg BID on 4/15.  - continue apixaban 10mg bid through 4/21               Then apixaban 5mg bid thereafter  - f/u with PCP to determine duration of anticoagulation, likely 6 mo minimum  - f/u   with PCP for age appropriate cancer screening and possible hypercoagulable workup  - consider f/u ECHO to monitor right heart strain     #HHS  #T2DM  Presented with elevated blood sugar leves in the 800s. He was not on any outpatient medications for DM. His HgA1c was 6.5 in 2020 then 7.5 in 2021 but it doesn't appear he followed up after that last result. Clinical picture consistent with HHS and he was given 3L fluid bolus. He was also started on insulin gtt and was then transitioned to subq insulin. He was initially on glargine basal and lispro TID bolus with meals but he had difficulty with the complexities of that routine. Endocrinology was consulted who recommended bid 70/30 plus metformin to start for ease of dosing. He had his first dose of metformin in the hospital and reported no GI distress. He had education from the nurses as well as the diabetes educator and nutritionist.  - chemstix qam and pm before insulin  - 70/30 insulin 48u am and 24u pm  - metformin 500mg PO daily x 7d then increase to bid  - f/u with diabetes clinic 4/19  - will need outpatient diabetes care including eye exam      #Gout  Hx of gout flares in the LEs. Reported some tenderness in the left knee with tenderness over the joint line on exam 4/16. Ddx includes smoldering gout flare, OA. Usually takes colchicine at home for gout so was given colchicine 1.2mg PO then 0,6mg 1h after with significant improvement in his symptoms.   - continue home allopurinol   - f/u with PCP     #AKI: resolved.  Probably volume  depletion with HHS. Creatinine 2.3 on admission. Back to baseline 1.3 after fluid resuscitation.     #Hypomagnesemia  1.6 today. Don't want to give PO as he is also starting metformin which could give GI distress.  - replete 2g IV now     Resolved:  #AGMA   #Lactic acidosis  #Metabolic alkalosis   #Hypomagnesemia      Code Status: Full Code    Contacts: Primary Emergency Contact: (chart lists his brother Hirschman, Michael, Home Phone: [phone] but the number wouldn't connect despite several tries).  He has another brother Mark and sister-in-law Debra in [place] who came to pick him up and will be helping him after discharge. Mark Lardner [phone].             DISPOSITION:    01 HOME/SELF CARE [01]    CONDITION: good     CONSULTS COMPLETED:    IP CONSULT TO NUTRITION SERVICES     OPERATIONS/PROCEDURES:  No surgeries this admission    Additional procedures:  None     PRINCIPAL DIAGNOSTIC STUDIES/RESULTS:    Hemoglobin A1c 17    CTA Chest PE w Contrast   Final Result   Large pulmonary arterial embolic burden involving all lobar arteries of the bilateral lungs.      The RV/LV ratio is greater than 1, raising the possibility of right ventricular pressure overload. Additionally, there is flattening of the intraventricular septum with mild septal bowing.       Indications were communicated telephonically to Dr. Lin by Dr. Rissman at 1907 on 11/01/2020.         ATTENDING COMMENT:      Main pulmonary artery measures 3.3 cm suggestive of pulmonary hypertension.      +ct+      I have personally reviewed the images and agree with the report (or as edited).      TransTHORACIC echo (TTE) complete   Final Result   Conclusion    1. The left ventricle is normal in size. There is normal left ventricular  wall thickness. Global left ventricular function is normal (biplane EF 68%).  No regional wall motion abnormalities are present.  2. The right ventricle is severely dilated. The right ventricular systolic  function is severely  decreased. Right ventricular segmental wall motion  abnormalities are present with decreased function of the mid segment. The  right atrium is mildly dilated.  3. Normal valve structure and function.  4. The pulmonary artery systolic pressure is borderline elevated at 32 mmHg.  5, There is no pericardial effusion.     XR Chest 1 Vw   Final Result      Lungs: Clear.      Pleura: No effusion. No pneumothorax.      Heart and mediastinum: Unremarkable.      Bones: No acute or suspicious abnormality.         ED POCUS - Cardiac Ultrasound and Volume Assessment   Final Result            Discharge Orders   No activity restrictions   Order Comments: Because of the blood thinner, you will bleed more easily if you have an injury or accident.     Provider:   Order Comments: F/u Tuesday diabetes clinic at [place]     General Instructions (Specify)   Order Comments: You were admitted after you passed out and were found to have blood clots in your lungs. We don't know exactly why this happened, but we have you on a blood thinner called apixaban to allow your body to break down the clots over time. You will need this for at least 3 months. Dr. Goss your primary care doctor will make a decision on exactly how long to continue it.     You were also found to have high blood sugars due to diabetes and were started on insulin by injection and a pill called metformin. You will need to check your blood sugar before breakfast and dinner and take a shot of insulin before each of those meals.     Please read your medication calendar and the instructions very carefully as the blood thinner (apixaban) and metformin both change doses on Friday 11/09/20.     Diabetic     Diet type: Other    Explanatory comment: Diabetic        DISCHARGE PHYSICAL EXAM:   Vitals (Most recent in last 24 hrs)     T: 36.4 °C (11/05/20 0917)  BP: 134/71 (11/05/20 0917)  HR: 67 (11/05/20 0917)  RR: 18 (11/05/20 0917)  SpO2: 94 % (11/05/20 0917) Room air  T range:  Temp  Min: 36.4 °C  Max: 36.6 °C  Admit weight: (!) 107 kg (235 lb 14.3 oz) (11/01/20 2014)  Last weight: (!) 109.5 kg (241 lb 6.5 oz) (11/05/20 0450)       Physical Exam  Gen: awake and alert, lying in bed, NAD  HEENT: pupils equal, anicteric sclerae, MMM  CV: RRR no mrg  Pulm: coarse breath sounds bilaterally, good air movement, normal respiratory effort  Abd: normal BT soft NTND  Extrem: no edema, full ROM L knee  Skin: warm and dry no rash visible  Neuro/psych: normal affect and orientation, MAE.     ATTENDING TIME STATEMENT:   I spent more than 30 minutes on hospital discharge day management.    [facility] physicians mentioned in this note can be reached by calling [employer] at [phone]. If any part of this transcript is missing or to request   to request other transcripts for this patient call [phone]. For online access to patient records enroll in [place] [facility] [email or web address].

## 2020-11-05 NOTE — Nursing Note (Signed)
Illness Severity  Stable      Patient Summary  4/14 Syncopal episode experienced by patient but able to call 911, medics found pt tachpyneic and hypoxic, placed on NRB, denies chest pain. Upon arrival to [facility], pt hypotensive with MAP < 65 mmgh.BS was elevated, found to be > 800   with elevated anion gap.  Patient in DKA.  Patient 1st troponin I was elevated, has NSTEMI, potentially from high metabolic demand but ACS is also still on differential.  Patient started on insulin gtt. Patient breathing and blood   pressure better after 500 cc of LR x2 (1000 cc).  AKI likely from volume depletion.  Patient does not have history of diabetes but he does describe recent polydipsia (drinking lots of juice) and frequency.  Admit to MICU    A/Ox4, VSS. Glycemic team paged for diabetes teaching prior to discharge. Bedside teaching on insulin pen and lancet done by glycemic team. Pt's brother at bedside to also receive education on BG management. Pt able to do teach back   to nursing staff. PIV's removed. Pt to receive further discharge instructions from discharge lounge. Pt escorted to discharge lounge.

## 2020-11-06 ENCOUNTER — Ambulatory Visit: Payer: Medicare HMO | Attending: Family | Admitting: Family

## 2020-11-06 ENCOUNTER — Ambulatory Visit (HOSPITAL_BASED_OUTPATIENT_CLINIC_OR_DEPARTMENT_OTHER): Payer: Medicare HMO | Admitting: Nursing

## 2020-11-06 VITALS — BP 122/78 | HR 76 | Temp 98.1°F | Wt 244.0 lb

## 2020-11-06 DIAGNOSIS — E1165 Type 2 diabetes mellitus with hyperglycemia: Secondary | ICD-10-CM | POA: Insufficient documentation

## 2020-11-06 DIAGNOSIS — Z794 Long term (current) use of insulin: Secondary | ICD-10-CM | POA: Insufficient documentation

## 2020-11-06 LAB — GLUCOSE POC, HMC: Glucose (POC): 183 mg/dL — ABNORMAL HIGH (ref 62–125)

## 2020-11-06 LAB — BLOOD C/S: Culture: NO GROWTH

## 2020-11-06 NOTE — Progress Notes (Signed)
Met with pt and discussed the Once touch glucometer. Reviewed basics about the meter, the test strips and spent some time reviewing the lancet device. Specifically how to change out the needle and how to adjust the depth of the needle for the finger poke. Pt BG was 148 this am. He took his 48u of mixed insulin.     Reviewed injection technique with the 70/30 pen, mixing the insulin, the action time of the insulins, injecting the pen so that he can see the window to be sure the value returns to zero so he get the whole dose, etc. Pt states understanding.     Reviewed pt medications and he is taking them all as prescribed from memory.     Pt says that he was not a morning person and he used to be a grazer as a manner of eating previously. Now he eats 2 meals a day. Today b'fast was honey nut cheerios with almond milk, 2 boiled eggs, broccoli and a pork patty.   Dinner will be a salad an sandwich and a coke zero.   Previously he ate processed baked sweets and drank all sorts of sugary beverages as water. He is shaken by this experience and wants to make all the changes to his diet he can.       SBAR hand off done with provider.

## 2020-11-06 NOTE — Progress Notes (Signed)
DIABETES CLINIC NOTE  NEW PATIENT ASSESSMENT    ID/CC: Nathan [name] is a 70 year old male with T2DM, HLD, gout and recent NSTEMI and b/l PEs on Apixaban who presents to [facility] for consult. Presented after syncopal event and admitted 4/14-4/18 with submassive b/l PEs, NSTEMI, HHS, AKI and received new dx of T2DM.      Diabetes Background: Diagnosed with T2DM during hospitalization in April 2022 for b/l PE and NSTEMI.     Diabetes Complications:     Diabetes Medications:  HumuluLIN 70/30 48u before breakfast and 24u before dinner  Metformin 569m bid    Blood glucose meter data: Did not bring glucometer    Interval History: Presented to ED after syncopal event with BG in the 800s and HHS. Inpatient Endocrinology consulted who recommended bid mixed insulin and metformin for ease of dosing.     Per discharged summary dated 11/05/20:  #T2DM  "Presented with elevated blood sugar leves in the 800s.He was not on any outpatient medications for DM. His HgA1c was 6.5 in 2020 then 7.5 in 2021 but it doesn't appear he followed up after that last result.Clinical picture consistent with HHS and he was given 3L fluid bolus. He was also started on insulin gtt and was thentransitioned to subq insulin.He was initially on glargine basal and lispro TID bolus with meals but he had difficulty with the complexities of that routine. Endocrinology was consulted who recommended bid 70/30 plus metformin to start for ease of dosing. He had his first dose of metformin in the hospital and reported no GI distress."    Discharged yesterday and his given himself 2 doses of insulin. Patient did not bring glucometer with him today. RN reviewed with him how to use glucometer and administer insulin including mixing it first. Difficult getting hx today as he is complaining of persistent pain in left knee 2/2 gout flare.     Review of systems: Vision worse in the last few weeks, slightly better today. No numbness or tingling in extremities. No  ulcers or skin changes.     Social History:  Tobacco use: Did not address  Alcohol use: Did not address  Other substance use: Did not address  Diet:  Breakfast- Honey nut cheerios with almond milk, 2 boiled eggs, broccolli and a pork patty  Dinner- Salad, sandwich  Snacks- None  Beverages- Coke Zero, previously drank grape juice, orange juice, coke like it was water    Diabetes Care Guidelines:  Renal  is not on ACE/ARB  Ophthalmologic  Last eye exam [place] [facility]  is on statin, is not on aspirin  Podiatry  Last foot exam 11/06/20  No results found for: TSH    PAST MEDICAL HISTORY:  Past Medical History:   Diagnosis Date    Obesity, unspecified 11/24/2008    Unspecified asthma, with status asthmaticus 11/24/2008    Generalized osteoarthrosis, unspecified site 11/24/2008    Other and unspecified hyperlipidemia 54/10/8183   Urinary complications 56/09/1495      EXAM:  Vitals:    11/06/20 1311   Temp: 36.7 C   Pulse: 76   BP: 122/78   SpO2: 97%   Weight: (!) 110.7 kg (244 lb)     Physical Exam  Constitutional:       Appearance: Normal appearance.   Eyes:      Conjunctiva/sclera: Conjunctivae normal.   Pulmonary:      Effort: Pulmonary effort is normal.   Skin:  General: Skin is warm and dry.   Neurological:      Mental Status: He is alert.   Psychiatric:         Mood and Affect: Mood normal.         Behavior: Behavior normal.         Thought Content: Thought content normal.        Foot exam:  Right foot only, patient declines left foot exam as he is not comfortable taking of shoe and straightening leg given gout flare  - Visual inspection:  normal to inspection, with intact skin, no significant callus formation, no evidence of ischemia   - Monofilament exam:  normal    - Pulse exam: normal    - Vibration 128-Hz tuning fork:  normal       DATA REVIEW:  Hemoglobin A1C (%)   Date Value   11/04/2020 17.5 (H)     Cholesterol (HDL)   Date Value Ref Range Status   11/01/2020 30 (L) >39 mg/dL Final     Cholesterol (LDL)   Date Value  Ref Range Status   11/01/2020 107 <130 mg/dL Final     Triglyceride   Date Value Ref Range Status   11/01/2020 247 (H) <150 mg/dL Final     Creatinine (mg/dL)   Date Value   11/04/2020 1.30 (H)     No results found for: ALBCREATRATU    ASSESSMENT/PLAN:  Diagnoses and all orders for this visit:    Type 2 diabetes mellitus with hyperglycemia, with long-term current use of insulin  -     POC Glucose, Whole Blood - Manual  -     Referral to [facility]; Future    Overall Impression: Patient presents to [facility] as a walk-in after hospital discharge yesterday. He reports he is tired and ready to go home throughout visit and is complaining about gout flare in left knee. Drastic worsening of glycemic control in the last year with recent A1C of 17.5% compared to 7.5 in June of 2021. Reports he was drinking juice and soda "like it was water" and had not been on any antidiabetic agents.    Majority of visit time spent on educating him about diabetes and reinforcing how to use glucometer and administer insulin. Rotating injection sites. Reviewed Rule of 15s.    Tolerating metformin well and he will increase dose as directed. It is not clear that bid mixed insulin is the best option for him given unpredictable eating schedule, but inpatient endocrinology felt this would be less confusing for him due to patient overwhelm with new diagnosis. Could consider a once weekly GLP1RA in the future. Not sure that and SGLT2 is a good option given gout, although it could be considered.     He has a well established relationship with his PCP so I have referred him back to Dr. Franchot Erichsen for ongoing management of T2DM. Asked him to schedule a follow-up soon given recent gout flare. Discussed impact of nutrition and water intake on both diabetes and gout.    Medications:  HumuluLIN 70/30 48u before breakfast and 24u before dinner  Metformin 579m bid    Lifestyle Modifications:  Patient met with RN to discuss lifestyle and will follow-up with nutritionist  next week    Follow-up:  Follow-up with PCP for ongoing management of T2DM

## 2020-11-06 NOTE — Patient Instructions (Signed)
Diabetes Medications:  HumuluLIN 70/30 48u before breakfast and 24u before dinner  Metformin 500mg  bid    Make sure to roll your insulin pen between your hands to mix it before injecting.     I have referred you to ophthalmology for a diabetic retinal exam.

## 2020-11-12 DIAGNOSIS — E1165 Type 2 diabetes mellitus with hyperglycemia: Secondary | ICD-10-CM | POA: Insufficient documentation

## 2020-11-12 DIAGNOSIS — Z794 Long term (current) use of insulin: Secondary | ICD-10-CM | POA: Insufficient documentation

## 2020-11-22 ENCOUNTER — Telehealth (HOSPITAL_BASED_OUTPATIENT_CLINIC_OR_DEPARTMENT_OTHER): Payer: Self-pay | Admitting: Family

## 2020-11-22 ENCOUNTER — Other Ambulatory Visit (HOSPITAL_BASED_OUTPATIENT_CLINIC_OR_DEPARTMENT_OTHER): Payer: Self-pay

## 2020-11-22 DIAGNOSIS — E1165 Type 2 diabetes mellitus with hyperglycemia: Secondary | ICD-10-CM

## 2020-11-22 DIAGNOSIS — Z794 Long term (current) use of insulin: Secondary | ICD-10-CM

## 2020-11-22 MED ORDER — HUMULIN 70/30 KWIKPEN (70-30) 100 UNIT/ML SC SUPN
PEN_INJECTOR | SUBCUTANEOUS | 0 refills | Status: DC
Start: 2020-11-22 — End: 2020-12-26

## 2020-11-22 NOTE — Telephone Encounter (Signed)
RETURN CALL: Voicemail - Detailed Message      SUBJECT:  Refill Request    NAME OF MEDICATION(S): Insulin    DATE NEEDED BY: 5/19    PRESCRIBING PROVIDER: Alexandrier    PHARMACY NAME/LOCATION: [facility]     ADDITIONAL INFORMATION: PT prescreibed Insulin from 4/19 visit and will run out next week.    PT asked to be contacted by the clinic to discuss about the insulin.

## 2020-11-22 NOTE — Telephone Encounter (Signed)
Next visit with PCP 11/26/20    Last visit at diabetes clinic, 11/06/20. Per note:

## 2020-11-26 ENCOUNTER — Ambulatory Visit: Payer: Medicare HMO | Attending: Internal Medicine | Admitting: Internal Medicine

## 2020-11-26 VITALS — BP 111/77 | HR 74 | Wt 247.0 lb

## 2020-11-26 DIAGNOSIS — I2699 Other pulmonary embolism without acute cor pulmonale: Secondary | ICD-10-CM | POA: Insufficient documentation

## 2020-11-26 DIAGNOSIS — Z794 Long term (current) use of insulin: Secondary | ICD-10-CM | POA: Insufficient documentation

## 2020-11-26 DIAGNOSIS — E7849 Other hyperlipidemia: Secondary | ICD-10-CM | POA: Insufficient documentation

## 2020-11-26 DIAGNOSIS — R55 Syncope and collapse: Secondary | ICD-10-CM | POA: Insufficient documentation

## 2020-11-26 DIAGNOSIS — Z0000 Encounter for general adult medical examination without abnormal findings: Secondary | ICD-10-CM | POA: Insufficient documentation

## 2020-11-26 DIAGNOSIS — Z23 Encounter for immunization: Secondary | ICD-10-CM | POA: Insufficient documentation

## 2020-11-26 DIAGNOSIS — E1165 Type 2 diabetes mellitus with hyperglycemia: Secondary | ICD-10-CM | POA: Insufficient documentation

## 2020-11-26 DIAGNOSIS — I499 Cardiac arrhythmia, unspecified: Secondary | ICD-10-CM | POA: Insufficient documentation

## 2020-11-26 MED ORDER — COVID-19 MRNA MONOVALENT VACCINE (PFIZER) 30 MCG/0.3ML IM (WRAPPER)
30.0000 ug | Freq: Once | INTRAMUSCULAR | Status: AC
Start: 2020-11-26 — End: 2020-11-26
  Administered 2020-11-26: 30 ug via INTRAMUSCULAR

## 2020-11-26 NOTE — Progress Notes (Signed)
Adult Medicine Clinic - Outpatient Return Clinic Note    Date:  11/26/2020    Nathan [name] is a 70 year old male who is here today for   Chief Complaint   Patient presents with   . Follow-Up      INTERVAL HISTORY/HPI:    Nathan [name] is a 70 year old English speakingman who likes to go salmon fishingw/history of asthma, mild CKD, gouthere to establish care.     Patient was hospitalized 4/14 - 11/05/20 for syncope, PE, NSTEMI, Hyoxic resp failure, HHS, Newly diagnosed DM,  AKI, gout.      Nathan [name] arrives in clinic stating to be doing fairly well.  He is interested in fishing and discusses the incoming [place] salmon run that he indicates will swim [place].    During the visit we discussed his glycemic control. BS this morning was 74.  Highest 194, Lowest 62 (no symptoms). Insulin Kwikpen, 48 in AM, 24 in PM.  He has plans to follow-up with a nutritionist.    I have placed a call to [facility] to ensure that his medications were up-to-date.    Allergies:  To grass and cherry blossoms. He reports they have been kicking in late November. Not allergic to ASA      SH:  He has been staying at home.  He lives at home alone, likes to fish in the warmer months, he states he believes in Jesus, and lives on a fixed income.  He likes to "speak when spoken to".      PROBLEM LIST: See Epic Problem List    MEDICATIONS:    Current Outpatient Medications:   .  Albuterol Sulfate HFA 108 (90 Base) MCG/ACT Inhalation Aero Soln, Inhale 2 puffs by mouth every 6 hours as needed for shortness of breath/wheezing., Disp: 1 Inhaler, Rfl: 6  .  allopurinol 100 MG tablet, Take 2 tablets (200 mg) by mouth daily., Disp: 180 tablet, Rfl: 1  .  apixaban 5 MG tablet, Take 2 tablets (10 mg) by mouth every 12 hours through the end of Thursday 11/08/20, then on Friday 11/09/20, start taking 1 tablet (5 mg) by mouth 2 times daily, take ongoing until stopped by your primary doctor., Disp: 66 tablet, Rfl: 0  .  atorvastatin 40 MG tablet,  Take 1 tablet (40 mg) by mouth every evening. To lower cholesterol. (past due for annual exam), Disp: 90 tablet, Rfl: 0  .  glucometer (OneTouch Verio Flex System) w/Device kit, Use to check blood sugar 4 times a day., Disp: 1 kit, Rfl: 0  .  blood glucose test strip, 1 strip 4 times a day. Use to check blood sugar., Disp: 100 strip, Rfl: 0  .  colchicine 0.6 MG tablet, Take 2 tablets by mouth at first sign of flare followed by 1 table 1 hour later, Disp: 30 tablet, Rfl: 0  .  Fluticasone Propionate HFA 110 MCG/ACT Inhalation Aerosol, Inhale 1 puff by mouth every 12 hours., Disp: 1 Inhaler, Rfl: 6  .  insulin NPH & REGULAR (HumuLIN 70/30 KwikPen) (70-30) 100 UNIT/ML pen-injector, Inject 48 units under the skin every morning before breakfast AND 24 units every evening before dinner., Disp: 21 mL, Rfl: 0  .  ketoconazole 2 % shampoo, Apply to face and scalp as needed, Disp: 120 mL, Rfl: 3  .  lancet (OneTouch Delica Plus) 33g, Use 1 to check blood glucose 4 times a day., Disp: 100 each, Rfl: 0  .    loratadine 10 MG tablet, Take 1 tablet (10 mg) by mouth daily as needed for allergies. (past due for annual exam/follow up appointment), Disp: 30 tablet, Rfl: 2  .  metFORMIN 500 MG tablet, Take 1 tablet (500 mg) by mouth ONCE DAILY every morning through Thursday 11/08/20 and then take TWICE DAILY starting 11/09/20 ongoing., Disp: 60 tablet, Rfl: 3  .  pen needle 31G X 8 MM, Use to inject insulin 2 times a day., Disp: 100 each, Rfl: 0  .  Triamcinolone Acetonide 0.1 % External Ointment, Apply to affected area on neck 2 times a day., Disp: 15 g, Rfl: 0      Med Rec (07/11/19) - [facility] on [address].  Will call [facility] to confirm dosing.      Insulin Kwikpen  48 am and 24 pm.   Metformin 500 mg daily  Atorvastatin 40 mg  ASA - stopped. Will evaluate.     Allopurinol  200 mg daily   Colchicine PRN  - holding.    Fluticasone HFA   Loratadine 10 mg  Albuterol HFA 108  Apixaban (ELIQUIS) = 5 mg po q 12 hr  - verbal orders.        ALLERGIES:  Review of patient's allergies indicates:  No Known Allergies    PHYSICAL EXAM:    BP 111/77   Pulse 74   Wt (!) 112 kg (247 lb)   SpO2 93%   BMI 39.87 kg/m     NAD  Speaking in complete sentences  Lungs: clear  COR:  RRR w/o m  ABD:  + BS, NT  Ext: compression stockings in place  Neuro:  UE and LE strength intact.     Foot exam:  (11/26/20)   - Visual inspection:  normal to inspection, with intact skin, no significant callus formation, no evidence of ischemia. Right foot achilles tendon surgical repair well healed incision.     - Monofilament exam:  Intact,  Vibration intact.     - Pulse exam: DP's symmetric.     LAB AND IMAGING RESULTS:    11/01/20: ECHO:  Conclusion  1. The left ventricle is normal in size. There is normal left ventricular  wall thickness. Global left ventricular function is normal (biplane EF 68%).  No regional wall motion abnormalities are present.  2. The right ventricle is severely dilated. The right ventricular systolic  function is severely decreased. Right ventricular segmental wall motion  abnormalities are present with decreased function of the mid segment. The  right atrium is mildly dilated.  3. Normal valve structure and function.  4. The pulmonary artery systolic pressure is borderline elevated at 32 mmHg.  5. There is no pericardial effusion.      ASSESSMENT AND PLAN:    1.  DM.  A1c 17.5 (11/04/20).  BS:  62 - 190, 74 in a.m.    Foot Exam - 11/26/20 done  Ophthal visit referral made on 11/26/20.     A1C [facility] AM[facility] Latest Ref Rng & Units 03/03/2018 07/11/2019 12/26/2019   A1C 4.0 - 6.0 % 6.3 (H) 6.5 (H) 7.5 (H)     A1C [facility] AMB Latest Ref Rng & Units 11/01/2020 11/04/2020   A1C 4.0 - 6.0 % 17.1 (H) 17.5 (H)     2. Asthma.  He uses -Fluticasone 110 mcg BID  -Albuterol Q6hr PRNand has a stable pattern.  He reports that he has never called 911 or been hospitalized for Asthma.  No documentation of PFTs at [place]   Med or Care Everywhere.  PEF on exam today 300 lpm, no wheezing on  exam.  Will refill meds and refer for PFTs.  (not obtained. )    2. Chronic gout.  He report intermittent foot pain but is non-specific on interview.  Records indicate last flare podagra (L great toe) in June 2017.  He is minimizing red meat, and no alcohol.  He has CKD therefore goal is to use tylenol over NSAIDs for pain.  He is on Allopurinol 200 mg daily, Colchicine 0.6 mg 2 tabs for flare, then 1 tab 1 hr later.  Uric Acid 7.4 (07/07/18).  CrCl at 50 mL/min, allopurinol and colchicine doses acceptable.  ASA has been stopped due to gout and CKD.  (stable 11/26/20)    3. Cardiac Risk Factors.  BP 124/62 off meds, BMI 47, LDL 96, DM no, No FH CAD, No tobacco.   ASCVD Risk 9.8% Atorvastatin 40 MG. ASA has been stopped.  Repeat lipids and reassess ASCVD risk and role of ASA.      Cholesterol Latest Ref Rng & Units 11/07/2015 07/11/2019 11/01/2020   LDL Cholesterol <130 mg/dL 96 89 107     The 10-year ASCVD risk score Mikey Bussing DC Jr., et al., 2013) is: 17.9%    Values used to calculate the score:      Age: 29 years      Sex: Male      Is Non-Hispanic African American: Yes      Diabetic: Yes      Tobacco smoker: No      Systolic Blood Pressure: 235 mmHg      Is BP treated: No      HDL Cholesterol: 30 mg/dL      Total Cholesterol: 186 mg/dL      4. CKD.  Stable pattern over time.  Repeat labs.  Prot / Creat < 0.1 (02/22/16).  CrCl about 50 ml/min.      CREATININE [facility] AM[facility] Latest Ref Rng & Units 11/02/2020 11/03/2020   Creatinine 0.51 - 1.18 mg/dL 1.60 (H) 1.40 (H)     CREATININE [facility] AM[facility] Latest Ref Rng & Units 11/04/2020   Creatinine 0.51 - 1.18 mg/dL 1.30 (H)       5. Tinea Capitus.  Ketoconazole shampoo      HEALTH CARE MAINTANENCE / SCREENING:  (See below)    All:  NKA  GFR: 56 (11/04/20)  QTc = 459     PHQ 2 Screening: p    Colorectal Cancer Screening:        Tobacco Use:  Quit at age 34.  ETOH Use:  Occ use   Marijuana:  Occ    CT SCAN / Low Dose Lung CT (Age 68-80, >30 pyrs and quit < 15 yrs ago):    Hepatitis C /  HIV Screening:     Hepatitis C  AFP:  RUQ U/S:      Osteoporosis Screening: (RF: Age, prior Fx, Fam Hx, glucocorticoid, low body weight, Tob, ETOH, RA, Secondary Osteoporosis (hypogonad, menopause, malabs, Liver Dz, IBD). Screening: Women > 24, Women < 37, if post-menopausal with RF's, Men with low impact Fx or RF's.)  FRAX Score:      MEN:    Prostate Cancer Screening:     AAA Screening (Men 40-75, Any Tob Hx, one time):       Health Care Maintenance:    Fit test today. He declines PSA  - Occult Blood By IA, Stool; Future    FIT negative (07/23/18  and 07/26/19)        Cardiovascular:   AAA (65-51M who smoked):  ASA (45-59 yo w/ >10% CVD risk in 10 yr):   Lipids (all >35, younger if high risk):Last 10/2015: TC 161, TG 101, LDL 96, HDL 45. On Atorva 67m Qdaily  HbA1c(40-70 mf who are obese):Last A1C 6.3 02/2018  Cancer Screening  Family Hx:   Colon (age 959-75:Last FIT10/2018neg.Given FIT card 12/2019No family of hx colon cancer  Mammogram (50-20F biannual):NA  PAP (21-29 q3y, 30-65 q5y w/ HPV):NA  Lung (55-80yo if >30py and quit w/in 15 yrs):NA  PSA:No prostate cancer in family.Declines PSA after discussion of risks and benefits 06/2018  ID  HPNT:IRWEneg 2013  GC/Chlamydia (F<24 yo and high risk older):   Hepatitis serologies:   Hep C (1945-65 + high risk):Neg 2013  Hep B ( (high risk):     Immunizations  Influenza:Declined 04/2017  Tdap:Last 01/2011  Pneumovax (PCV13 age age >>24 and a year later PPSV23, 8 weeks later for immunocompromised adults. PCV13 a year after PPSV23 if no PCV13 before) :Had pneumovax 23 01/2009.  Zoster (age >60):Patient declined10/2018  Bone  Vit D:   Dexa scan (f>65, or high risk):      I asked about Covid Booster. He will take this today.  (11/26/20)    Immunization History   Administered Date(s) Administered   . COVID-19 Moderna mRNA LNP-S 09/19/2019, 10/18/2019   . Hepatitis B, unspecified 10/05/2007, 11/05/2007, 05/08/2008   . Influenza  quadrivalent PF 04/19/2013, 06/12/2014, 05/01/2015   . Influenza quadrivalent adjuvanted (Fluad) 05/19/2019   . Influenza trivalent PF 07/07/2012   . Influenza, unspecified 09/26/2010   . Pneumococcal polysaccharide PPSV23 (Pneumovax 23) 02/12/2009   . Tdap 01/21/2011     Future Appointments   Date Time Provider [place]  11/26/2020 10:00 AM GCecelia Byars MD [facility]   11/28/2020 11:00 AM H037 NUTRITIONIST [id number]   05/16/2021  1:00 PM AWilliam Hamburger OD [facility]     Total time spent in the direct care of this patient and in reviewing records and documentation on the date of service was 50 minutes.

## 2020-11-26 NOTE — Patient Instructions (Addendum)
Today we re-evaluated your current medical condition and reviewed your recent admission.    We called [facility] and made sure your medications are up to date.     I did a foot exam, and it checked out well.      I also made a referral for an eye doctor sooner than October.      I will see you in 3 months.      You will see our Nutritionist on 11/28/20.      Please call Dr. Franchot Erichsen at [phone] for any questions.

## 2020-11-26 NOTE — Progress Notes (Signed)
COVID-19 Vaccine Intake Documentation      Pre-Vaccination Screening Questions:         1.  Are you feeling sick today?     NO       2. Have you ever received a dose of COVID-19 vaccine?     YES         If yes, which vaccine product?   Moderna         3.  Have you  ever had a severe allergic reaction    (e.g., anaphylaxis) to something?  For example, a reaction for    which you were treated with epinephrine or Epi Pen®  or    for which you had to go to the hospital?       NO       • Was the severe allergic reaction after receiving a COVID-19 vaccine?        NO     • Was the severe allergic reaction after receiving another vaccine or another injectable medication?     NO       4. Have you received passive antibody treatment    (monoclonal antibodies or convalescent serum)     as a treatment for COVID-19?     NO       5. Do you have a weakened immune system caused    by something such as HIV infection or cancer, or do    you take immunosuppressive drugs or therapies?     NO       6. Do you have a bleeding disorder or are you taking a    blood thinner?     NO      7. Are you pregnant or breastfeeding?     NO       8. Does the patient meet the age requirements for the    vaccine?         *Pfizer is 12 years and older, Moderna and      Johnson & Johnson are 18 years and older.             Yes      9. If this is the patient's second dose, has the minimum    timeframe passed?      *Pfizer can be given at 21 days and Moderna can be      given at 28 days.       N/A    Patient tolerated well. Patient left without any complications.

## 2020-11-28 ENCOUNTER — Ambulatory Visit: Payer: Medicare HMO | Attending: Internal Medicine | Admitting: Registered"

## 2020-11-28 VITALS — Ht 66.0 in | Wt 248.0 lb

## 2020-11-28 DIAGNOSIS — E1165 Type 2 diabetes mellitus with hyperglycemia: Secondary | ICD-10-CM | POA: Insufficient documentation

## 2020-11-28 DIAGNOSIS — Z713 Dietary counseling and surveillance: Secondary | ICD-10-CM | POA: Insufficient documentation

## 2020-11-28 DIAGNOSIS — Z794 Long term (current) use of insulin: Secondary | ICD-10-CM | POA: Insufficient documentation

## 2020-11-28 NOTE — Progress Notes (Signed)
Nutrition Note    Clinic:  Endocrinology    Note Type: New    Reason For Visit: Diabetes management    An interpreter was not needed for the visit.    Assessment    Patient Active Problem List   Diagnosis   . Obesity, unspecified   . Unspecified asthma, with status asthmaticus   . Generalized osteoarthrosis, unspecified site   . Other hyperlipidemia   . Presbyopia   . Gout   . Irregular heart beat   . Achilles tendinosis of right lower extremity   . Mild intermittent asthma without complication   . Healthcare maintenance   . Non-seasonal allergic rhinitis   . Subacute massive pulmonary embolism   . Type 2 diabetes mellitus with hyperglycemia, with long-term current use of insulin   . Syncope       Weight History:   Vitals 11/06/2020 11/26/2020 11/28/2020   Weight (lbs) 244 lb 247 lb 248 lb   Weight (kg) 110.678 kg 112.038 kg 112.492 kg       Adult BMI Classification: obese categ. II (36-40)     Labs:   DIABETES TRACKING 11/04/2020   A1C 17.5 (H)     Self monitored blood glucose: Yes  Checking in AM before breakfast and late afternoon (4:30 - 7:30p). MA was unable to pull up glucometer readings. Pt reports:   Fasting: 63-193  Late afternoon: 193     Medications:  Current Outpatient Medications   Medication Instructions   . Albuterol Sulfate HFA 108 (90 Base) MCG/ACT Inhalation Aero Soln 2 puffs, Inhalation, Every 6 hours PRN   . allopurinol (ZYLOPRIM) 200 mg, Oral, Daily   . apixaban 5 MG tablet Take 2 tablets (10 mg) by mouth every 12 hours through the end of Thursday 11/08/20, then on Friday 11/09/20, start taking 1 tablet (5 mg) by mouth 2 times daily, take ongoing until stopped by your primary doctor.   Marland Kitchen atorvastatin 40 MG tablet Take 1 tablet (40 mg) by mouth every evening. To lower cholesterol. (past due for annual exam)   . blood glucose test strip 1 strip, Does not apply, 4 times daily, Use to check blood sugar.   . colchicine 0.6 MG tablet Take 2 tablets by mouth at first sign of flare followed by 1 table 1 hour  later   . Fluticasone Propionate HFA 110 MCG/ACT Inhalation Aerosol 1 puff, Inhalation, Every 12 hours   . glucometer (OneTouch Verio Flex System) w/Device kit Use to check blood sugar 4 times a day.   . insulin NPH & REGULAR (HumuLIN 70/30 KwikPen) (70-30) 100 UNIT/ML pen-injector Inject 48 units under the skin every morning before breakfast AND 24 units every evening before dinner.   Marland Kitchen ketoconazole 2 % shampoo Apply to face and scalp as needed   . lancet (OneTouch Delica Plus) 25D Use 1 to check blood glucose 4 times a day.   . loratadine (CLARITIN) 10 mg, Oral, Daily PRN, (past due for annual exam/follow up appointment)   . metFORMIN 500 MG tablet Take 1 tablet (500 mg) by mouth ONCE DAILY every morning through Thursday 11/08/20 and then take [place] starting 11/09/20 ongoing.   . pen needle 31G X 8 MM Use to inject insulin 2 times a day.   . Triamcinolone Acetonide 0.1 % External Ointment Neck, 2 times daily       Vitamins/Minerals/Herbal Supplements: not discussed    Barriers to adequate intake: None    GI tolerance: None    Activity:  not discussed    Food intolerances/allergies: None reported    Diet History:   Pt reports consuming 2 meals/d:   B: large bowl of cereal (frosted flakes, sweetened flavored cheerios) with unsweetened almond milk, 1-2 hard-boiled eggs. May eat 2-3 scrambled eggs, sausage and a handful of grape tomatoes instead.    L: none    D: 4-5p eats boiled chicken/turkey hotdog, breaded fish, salad, handful of tomatoes; has sandwich on 12-grain bread if he's out fishing.    S: pecans, crackers with cheese, grapes  Bev: water throughout the day, iced tea sweetened with stevia, Carbmaster brand chocolate milk. Has a few swallows of apple juice in AM if fasting blood glucose is low.    Also reports enjoying fruit (melons).     Information from Patient:   --Pt confirms taking 48U 70/30 insulin q AM and 24U 70/30 insulin before dinner.  --Pt had many questions about individual foods based on  videos he'd seen on YouTube (e.g. sourdough, gluten, olive oil, tomatoes).     Assessment Summary:   Nutrition Diagnosis: Food and nutrition related knowledge deficit   related to adequate eating pattern for T2DM evidenced by pt questions and report.    Interventions and Patient Goals:   --Pt desires wt loss and better control of blood glucose levels.    --Reviewed sources of CHO and plate method for balancing meals. Pt voiced understanding of the need to limit but not eliminate CHO.  --Commended pt on increased vegetable intake at meals, switching to stevia for tea, and use of whole grains.  --Discussed sugar in cereals. Pt to try plain cheerios and add fruit or unsweetened flavored almond milk to sweeten.  --Reviewed portion sizes for fruit and encouraged pt to limit portions of melon, grapes, etc.    Education Provided:  Please refer to Education section of chart     Education materials provided: NO    Time Spent with Patient: 45 mins    Plan/Recommendations    Follow up: In 1 month(s)    Plan for next visit: answer questions, review glucometer readings.    Total Time Spent:  60 mins      Amy Ervin, Dietetic Intern    I have read and agree with the above assessment.    Ed Blalock, MS, CD, RD

## 2020-12-07 ENCOUNTER — Other Ambulatory Visit (HOSPITAL_BASED_OUTPATIENT_CLINIC_OR_DEPARTMENT_OTHER): Payer: Self-pay

## 2020-12-07 ENCOUNTER — Other Ambulatory Visit (HOSPITAL_COMMUNITY): Payer: Self-pay | Admitting: Internal Medicine

## 2020-12-07 DIAGNOSIS — Z794 Long term (current) use of insulin: Secondary | ICD-10-CM

## 2020-12-07 DIAGNOSIS — E1165 Type 2 diabetes mellitus with hyperglycemia: Secondary | ICD-10-CM

## 2020-12-09 ENCOUNTER — Other Ambulatory Visit (HOSPITAL_BASED_OUTPATIENT_CLINIC_OR_DEPARTMENT_OTHER): Payer: Self-pay

## 2020-12-09 MED ORDER — BD PEN NEEDLE SHORT ULTRAFINE 31G X 8 MM MISC
1.0000 | Freq: Two times a day (BID) | 0 refills | Status: DC
Start: 2020-12-09 — End: 2020-12-19
  Filled 2020-12-09: qty 100, 50d supply, fill #0

## 2020-12-19 ENCOUNTER — Telehealth (HOSPITAL_BASED_OUTPATIENT_CLINIC_OR_DEPARTMENT_OTHER): Payer: Self-pay | Admitting: Internal Medicine

## 2020-12-19 DIAGNOSIS — E1165 Type 2 diabetes mellitus with hyperglycemia: Secondary | ICD-10-CM

## 2020-12-19 DIAGNOSIS — Z794 Long term (current) use of insulin: Secondary | ICD-10-CM

## 2020-12-19 MED ORDER — BD PEN NEEDLE SHORT ULTRAFINE 31G X 8 MM MISC
1.0000 | Freq: Two times a day (BID) | 0 refills | Status: DC
Start: 2020-12-19 — End: 2020-12-26

## 2020-12-19 NOTE — Telephone Encounter (Signed)
RETURN CALL: Voicemail - Detailed Message      SUBJECT:  Refill Request    NAME OF MEDICATION(S): pen needle (BD Pen Needle Short U/F) 31G X 8 MM  DATE NEEDED BY: 12/19/2020  PRESCRIBING PROVIDER: [name], MD  PHARMACY NAME/LOCATION: [facility] [id number] [address] [place] [phone] [id number]   [address] [place] [address]   Phone:  [phone] Fax:  [phone]   ADDITIONAL INFORMATION: N/A

## 2020-12-25 ENCOUNTER — Telehealth (HOSPITAL_BASED_OUTPATIENT_CLINIC_OR_DEPARTMENT_OTHER): Payer: Self-pay | Admitting: Family

## 2020-12-25 DIAGNOSIS — Z794 Long term (current) use of insulin: Secondary | ICD-10-CM

## 2020-12-25 DIAGNOSIS — E1165 Type 2 diabetes mellitus with hyperglycemia: Secondary | ICD-10-CM

## 2020-12-25 NOTE — Telephone Encounter (Signed)
RETURN CALL: Voicemail - Detailed Message      SUBJECT:  Refill Request    NAME OF MEDICATION(S): insulin NPH & REGULAR (HumuLIN 70/30 KwikPen) (70-30) 100 UNIT/ML pen-injector   DATE NEEDED BY: as soon as possible  PRESCRIBING PROVIDER: Roini Wadhwani ARNP  PHARMACY NAME/LOCATION: [facility]  ADDITIONAL INFORMATION: Patient is requesting a refill on his insulin, stated he called his preferred pharmacy today [facility]) and they are out of stock. He is wondering if he can pick up the prescription at [facility] if [facility] doesn't get a shipment before Friday, 6/10. Please advise and contact patient.

## 2020-12-26 ENCOUNTER — Other Ambulatory Visit (HOSPITAL_BASED_OUTPATIENT_CLINIC_OR_DEPARTMENT_OTHER): Payer: Self-pay

## 2020-12-26 MED ORDER — HUMULIN 70/30 KWIKPEN (70-30) 100 UNIT/ML SC SUPN
PEN_INJECTOR | SUBCUTANEOUS | 0 refills | Status: DC
Start: 2020-12-26 — End: 2020-12-28
  Filled 2020-12-26: qty 21, 29d supply, fill #0

## 2020-12-26 MED ORDER — BD PEN NEEDLE SHORT ULTRAFINE 31G X 8 MM MISC
1.0000 | Freq: Two times a day (BID) | 0 refills | Status: DC
Start: 2020-12-26 — End: 2021-02-06
  Filled 2020-12-26 – 2021-01-24 (×3): qty 100, 50d supply, fill #0

## 2020-12-26 NOTE — Telephone Encounter (Signed)
Sent refills of insulin and pen needles to [place]. Patient to follow-up with PCP for ongoing management of T2DM.

## 2020-12-27 ENCOUNTER — Other Ambulatory Visit (HOSPITAL_BASED_OUTPATIENT_CLINIC_OR_DEPARTMENT_OTHER): Payer: Self-pay

## 2020-12-28 ENCOUNTER — Telehealth (HOSPITAL_BASED_OUTPATIENT_CLINIC_OR_DEPARTMENT_OTHER): Payer: Self-pay | Admitting: Family

## 2020-12-28 ENCOUNTER — Other Ambulatory Visit (HOSPITAL_BASED_OUTPATIENT_CLINIC_OR_DEPARTMENT_OTHER): Payer: Self-pay

## 2020-12-28 DIAGNOSIS — E1165 Type 2 diabetes mellitus with hyperglycemia: Secondary | ICD-10-CM

## 2020-12-28 DIAGNOSIS — Z794 Long term (current) use of insulin: Secondary | ICD-10-CM

## 2020-12-28 MED ORDER — HUMULIN 70/30 KWIKPEN (70-30) 100 UNIT/ML SC SUPN
PEN_INJECTOR | SUBCUTANEOUS | 0 refills | Status: DC
Start: 2020-12-28 — End: 2021-05-13
  Filled 2020-12-28 – 2021-01-24 (×2): qty 21, 28d supply, fill #0

## 2020-12-28 NOTE — Telephone Encounter (Signed)
RETURN CALL: Detailed message on voicemail only    SUBJECT:  Refill Request    NAME OF MEDICATION(S): insulin NPH & REGULAR (HumuLIN 70/30 KwikPen)     DATE NEEDED BY: today, 6/10    PRESCRIBING PROVIDER: Clide Deutscher, ARNP    PHARMACY [place]    ADDITIONAL INFORMATION: The patient said that he is going to request this refill from his PCP at [facility] since he was informed that his PCP: [name], MD, would take care of this refill in order for him to receive it today.

## 2020-12-28 NOTE — Telephone Encounter (Signed)
Rx was authorized on 6/08 it does not show pharmacy confirmation

## 2020-12-28 NOTE — Telephone Encounter (Signed)
ETURN CALL: Detailed message on voicemail only    SUBJECT:  Refill Request    NAME OF MEDICATION(S): insulin NPH & REGULAR (HumuLIN 70/30 KwikPen)     DATE NEEDED BY: today, 6/10    PRESCRIBING PROVIDER: Clide Deutscher, ARNP    PHARMACY [place]    ADDITIONAL INFORMATION: The patient said that he is requesting this refill from his PCP at [facility] since he was informed that his PCP: [name], MD, would take care of this refill in order for him to receive it today.

## 2021-01-02 ENCOUNTER — Ambulatory Visit: Payer: Medicare HMO | Attending: Internal Medicine | Admitting: Registered"

## 2021-01-02 VITALS — Ht 65.75 in | Wt 239.9 lb

## 2021-01-02 DIAGNOSIS — E119 Type 2 diabetes mellitus without complications: Secondary | ICD-10-CM | POA: Insufficient documentation

## 2021-01-02 DIAGNOSIS — Z713 Dietary counseling and surveillance: Secondary | ICD-10-CM | POA: Insufficient documentation

## 2021-01-02 NOTE — Patient Instructions (Addendum)
1) Lower insulin to 44 units in the morning and 20 units in the evening    2) limit carbohydrate to 60 grams per meal

## 2021-01-02 NOTE — Progress Notes (Unsigned)
Nutrition Note    Clinic:  Endocrinology    Note Type: Followup    Reason For Visit: Diabetes management   Referral Dr Franchot Erichsen 11/28/20    An interpreter was not needed for the visit.    Assessment    Patient Active Problem List   Diagnosis   . Obesity, unspecified   . Unspecified asthma, with status asthmaticus   . Generalized osteoarthrosis, unspecified site   . Other hyperlipidemia   . Presbyopia   . Gout   . Irregular heart beat   . Achilles tendinosis of right lower extremity   . Mild intermittent asthma without complication   . Healthcare maintenance   . Non-seasonal allergic rhinitis   . Subacute massive pulmonary embolism   . Type 2 diabetes mellitus with hyperglycemia, with long-term current use of insulin   . Syncope     Weight History:   Vitals Weight (lbs) BMI (kg/m2)   01/02/2021 239 lb 14.4 oz 39.02     Adult BMI Classification: obese categ. II (36-40)     Vitals Weight (lbs)   11/28/2020 248 lb     Labs:   DIABETES TRACKING 11/04/2020   A1C 17.5 (H)     Self monitored blood glucose: checking 2x/d before breakfast and before dinner.  6/2- 01/02/21: 43- 136mg /dL, avg 92mg /dL    Previous  11/28/20: 46- 198mg /dL     Medications include:  70/30 decreased on 01/02/21 from 48 units am and 24 units pm  to 44 units am amd 20 units pm    Vitamins/Minerals/Herbal Supplements: not discussed    Barriers to adequate intake: None    GI tolerance: None    Activity: went fishing the other day; tries not to sit around all day      Food intolerances/allergies: None reported    Diet History:   Pt reports consuming 2 meals/d:   B: bowl of cereal (raisin bran instead of previous frosted flakes, sweetened flavored cheerios) with unsweetened almond milk, 1-2 hard-boiled eggs. May eat 2-3 scrambled eggs, sausage and a handful of grape tomatoes instead.    L: none    D: 4-5p eats boiled chicken/turkey hotdog, breaded fish, salad, handful of tomatoes; has sandwich on 12-grain bread if he's out fishing.    S: pecans, crackers with  cheese, grapes  Bev: water throughout the day, iced tea sweetened with stevia, Carbmaster brand chocolate milk. Apple juice had previously said he only drank when low but sounds like may be drinking more often     Also reports enjoying fruit (melons).   [place] Chocolate vs pie "not as sweet"    Information from Patient:     Previous  --Pt had many questions about individual foods based on videos he'd seen on YouTube (e.g. sourdough, gluten, olive oil, tomatoes).     Assessment Summary: 70 year old male w/history of asthma, mild CKD, gouthere.  Patient was hospitalized 4/14 - 11/05/20 for syncope, PE, NSTEMI, Hyoxic resp failure, HHS, Newly diagnosed DM,  AKI  .    Nutrition Diagnosis: Food and nutrition related knowledge deficit related to adequate eating pattern for T2DM evidenced by pt questions and report.  Some improvement in choices based on recall today (6/15).      Interventions and Patient Goals:   -decreased insulin 2/2 low blood glucose levels  -reviewed carbohydrate managed diet and recommended limiting carbohydrates to 60g with meals and 15grams with snacks    Previous.   --Reviewed sources of CHO and plate method for  balancing meals. Pt voiced understanding of the need to limit but not eliminate CHO.  --Commended pt on increased vegetable intake at meals, switching to stevia for tea, and use of whole grains.  --Discussed sugar in cereals. Pt to try plain cheerios and add fruit or unsweetened flavored almond milk to sweeten.  --Reviewed portion sizes for fruit and encouraged pt to limit portions of melon, grapes, etc.    Education Provided:  Please refer to Education section of chart     Education materials provided: My Food Plan  Insulin instructions in AVS    Time Spent with Patient: 30 mins    Plan/Recommendations    Follow up: 02/06/21    Plan for next visit: answer questions, review glucometer readings.    Total Time Spent:  60 mins      Ed Blalock, MS, RD, CD, [facility]  Dietitian  Department mobile: [phone]. [phone]  Email: [email or web address]

## 2021-01-24 ENCOUNTER — Other Ambulatory Visit (HOSPITAL_BASED_OUTPATIENT_CLINIC_OR_DEPARTMENT_OTHER): Payer: Self-pay

## 2021-01-26 ENCOUNTER — Other Ambulatory Visit (HOSPITAL_BASED_OUTPATIENT_CLINIC_OR_DEPARTMENT_OTHER): Payer: Self-pay

## 2021-02-04 ENCOUNTER — Other Ambulatory Visit (HOSPITAL_BASED_OUTPATIENT_CLINIC_OR_DEPARTMENT_OTHER): Payer: Self-pay

## 2021-02-06 ENCOUNTER — Other Ambulatory Visit (HOSPITAL_BASED_OUTPATIENT_CLINIC_OR_DEPARTMENT_OTHER): Payer: Self-pay | Admitting: Internal Medicine

## 2021-02-06 ENCOUNTER — Ambulatory Visit: Payer: Medicare HMO | Admitting: Registered"

## 2021-02-06 ENCOUNTER — Other Ambulatory Visit (HOSPITAL_BASED_OUTPATIENT_CLINIC_OR_DEPARTMENT_OTHER): Payer: Self-pay

## 2021-02-06 VITALS — Wt 243.6 lb

## 2021-02-06 DIAGNOSIS — Z794 Long term (current) use of insulin: Secondary | ICD-10-CM

## 2021-02-06 DIAGNOSIS — E119 Type 2 diabetes mellitus without complications: Secondary | ICD-10-CM

## 2021-02-06 DIAGNOSIS — Z713 Dietary counseling and surveillance: Secondary | ICD-10-CM

## 2021-02-06 DIAGNOSIS — E1165 Type 2 diabetes mellitus with hyperglycemia: Secondary | ICD-10-CM

## 2021-02-06 NOTE — Patient Instructions (Signed)
1)   If you do NOT have carbohydrate at breakfast (just eggs and sausage for example) take 40 units of insulin.           If you DO have carbohydrate at breakfast (bread, cereal, pancakes, fruit) take 44 units of insulin

## 2021-02-06 NOTE — Progress Notes (Signed)
Nutrition Note    Clinic:  Endocrinology    Note Type: Followup    Reason For Visit: Diabetes management   Referral Dr Franchot Erichsen 11/28/20    An interpreter was not needed for the visit.    Assessment    Patient Active Problem List   Diagnosis   . Obesity, unspecified   . Unspecified asthma, with status asthmaticus   . Generalized osteoarthrosis, unspecified site   . Other hyperlipidemia   . Presbyopia   . Gout   . Irregular heart beat   . Achilles tendinosis of right lower extremity   . Mild intermittent asthma without complication   . Healthcare maintenance   . Non-seasonal allergic rhinitis   . Subacute massive pulmonary embolism   . Type 2 diabetes mellitus with hyperglycemia, with long-term current use of insulin   . Syncope     Weight History:   Vitals Weight (lbs) BMI (kg/m2)   02/06/2021 243 lb 9.6 oz 39.62     Adult BMI Classification: obese categ. II (36-40)     Last RD visit  Vitals Weight (lbs)   01/02/2021 239 lb 14.4 oz     Initial RD visit  Vitals Weight (lbs)   11/28/2020 248 lb     Labs:   DIABETES TRACKING 11/04/2020   A1C 17.5 (H)     Self monitored blood glucose: checking 2x/d before breakfast and before dinner.  7/7-7/20/22:   Fasting 73- 188mg /dL (avg 145mg /dL)  Before dinner: 67- 105mg /dL (avg 77mg /dL)  \  Previous  6/2- 01/02/21: 43- 136mg /dL, avg 92mg /dL  11/28/20: 46- 198mg /dL     Medications include:  70/30  44 units am amd 20 units pm    Vitamins/Minerals/Herbal Supplements: not discussed    Barriers to adequate intake: mentions running out of food, did not explore with him today    GI tolerance: None    Activity: went fishing the other day; tries not to sit around all day    Food intolerances/allergies: None reported    Diet History:   Pt reports consuming 2 meals/d:   B: usually  2-3 scrambled eggs, sausage and a handful of grape tomatoes instead; stopped eating bread for the most part, tries to have sourdough instead of whole wheat but this is expensive.  Sometimes will have small bowl of raisin  bran with unsweetened almond milk, 1-2 hard-boiled eggs. May eat    L: none    D: 4-5p eats boiled chicken/turkey hotdog/fish,veg/ salad; likes kidney beans but has not been able to find. Sometimes a  Sandwich, eatubg lots of salad occasionally will have a few steak potatoes    S: pecans, crackers with cheese, grapes  Bev: water throughout the day, Carbmaster brand chocolate milk, apple juice had previously said he only drank when low but sounds like may be drinking more often ; asks about Brisk Iced Tea and Lemonade    Likes watermelon but trying to avoid  Bought sugar-free blueberry pie    Information from Patient:   Learning more about nutrition management helpful- better in small chunks of information  Would like to know what he needs to do to not need insulin    Previous  --Pt had many questions about individual foods based on videos he'd seen on YouTube (e.g. sourdough, gluten, olive oil, tomatoes).     Assessment Summary: 70 year old male w/history of asthma, mild CKD, gouthere.  Patient was hospitalized 4/14 - 11/05/20 for syncope, PE, NSTEMI, Hyoxic resp failure, HHS, Newly diagnosed  DM,  AKI  .    Nutrition Diagnosis: Food and nutrition related knowledge deficit evidenced by pt questions and report.    Diabetes management improved based on SMBG, values before dinner on the low side, may benefit from decreasing insulin dose at breakfast especially if he does not have any carbohydrate at this meal     Interventions and Patient Goals:   -recommended decreasing insulin before breakfast to 40 units (from 44) if he is not including any carbohydrate, ok to continue w/ 44 if he does have carb with this meal.  Continue w/ 20 units before dinner  -reviewed carbohydrate managed diet, sources of carbohydrate, plate method  - reviewed label reading    Previous.   --Reviewed sources of CHO and plate method for balancing meals. Pt voiced understanding of the need to limit but not eliminate CHO.  --Commended pt on  increased vegetable intake at meals, switching to stevia for tea, and use of whole grains    Education Provided:  Please refer to Education section of chart     Education materials provided:   My Food Plan  Insulin instructions in AVS    Plan  If you do NOT have carbohydrate at breakfast (just eggs and sausage for example) take 40 units of insulin.           If you DO have carbohydrate at breakfast (bread, cereal, pancakes, fruit) take 44 units of insulin    Time Spent with Patient: 60 mins    Plan/Recommendations    Follow up: 03/13/21    Plan for next visit: answer questions, review SMBG    Total Time Spent:  75 mins      Ed Blalock, MS, RD, CD, [facility] Dietitian  Department mobile: [phone]. [phone]  Email: [email or web address]

## 2021-02-08 ENCOUNTER — Other Ambulatory Visit (HOSPITAL_BASED_OUTPATIENT_CLINIC_OR_DEPARTMENT_OTHER): Payer: Self-pay

## 2021-02-09 NOTE — Telephone Encounter (Signed)
This medication is outside of the [facility]'s protocols. Please sign and close the encounter if you approve: pen needles    11/06/20 - "Follow-up with PCP for ongoing management of T2DM"    If this medication is denied please have your staff inform the patient and schedule an appointment if necessary.

## 2021-02-12 ENCOUNTER — Other Ambulatory Visit (HOSPITAL_BASED_OUTPATIENT_CLINIC_OR_DEPARTMENT_OTHER): Payer: Self-pay

## 2021-02-12 MED ORDER — BD PEN NEEDLE SHORT ULTRAFINE 31G X 8 MM MISC
1.0000 | Freq: Two times a day (BID) | 3 refills | Status: DC
Start: 2021-02-12 — End: 2021-03-20
  Filled 2021-02-12: qty 200, 100d supply, fill #0

## 2021-02-19 ENCOUNTER — Ambulatory Visit: Payer: Medicare HMO | Attending: Ophthalmology

## 2021-02-19 DIAGNOSIS — E1165 Type 2 diabetes mellitus with hyperglycemia: Secondary | ICD-10-CM | POA: Insufficient documentation

## 2021-02-19 DIAGNOSIS — Z794 Long term (current) use of insulin: Secondary | ICD-10-CM | POA: Insufficient documentation

## 2021-02-19 NOTE — Progress Notes (Signed)
CC:   Chief Complaint   Patient presents with   . Referral     70yo referred by [facility] for diabetic ocular evaluation. Patient notes having some blurry [place] with small print. Had a period where [place] was worse at the beginning of spring, but notes it's improved since then.    . Medication Review     /Ocular/: None currently        HISTORY OF PRESENT ILLNESS:  Mr. Jacks is a 70 year old male who presents 02/19/2021 as a new patient for diabetic eye exam.    Patient notes that he suffered from some blurred vision in both eyes and possible rare floaters 7-8 months ago, but this has gradually resolved over months. Denies eye pain. Denies new f/f/c.         Hemoglobin A1C (%)   Date Value   11/04/2020 17.5 (H)       PAST OCULAR HISTORY:  none    OMeds:  none    MEDICATIONS:  Reviewed names without dosages with patient and updated in epic tabs 02/19/2021       OCT macula 02/19/21  OD: hyaloid face down, normal fc, normal retinal laminations, no IRF/SRF, ?fibrovascular PED vs druse nasally adjacent to nerve.   OS:  hyaloid face down, normal fc, normal retinal laminations, no IRF/SRF.    IMPRESSION/PLAN:  1. DM2 without retinopathy  - last A1c 17.5 (although this was during an episode of hyperglycemic hyperosmolar syndrome)  - discussed importance of good blood glucose control  - recommend annual DFE (next due 02/2022)    2. Mixed type cataracts, both eyes  - mild visual significance left eye  - monitor    3.    Presbyopia  - Discussed with patient the need for reading glasses as he ages.  He understands and is happy to try reading glasses  - recommend +2.00 to +2.50 OTC readers     Jeannetta Ellis, MD  Ophthalmology  PGY3  [facility]

## 2021-02-19 NOTE — Progress Notes (Signed)
SD-OCT performed in clinic, BE macula & posterior pole w/good cooperation.  Images/reports stored on [employer].

## 2021-02-20 NOTE — Progress Notes (Signed)
Attending Note:  I did not see the patient but have reviewed the note and agree with the assessment and plan.    Bernd Crom T. Shaunice Levitan, MD, PhD  Attending Physician   Division of Comprehensive Ophthalmology   [facility]

## 2021-03-13 ENCOUNTER — Ambulatory Visit (HOSPITAL_BASED_OUTPATIENT_CLINIC_OR_DEPARTMENT_OTHER): Payer: Medicare HMO

## 2021-03-14 ENCOUNTER — Telehealth (HOSPITAL_BASED_OUTPATIENT_CLINIC_OR_DEPARTMENT_OTHER): Payer: Self-pay | Admitting: Internal Medicine

## 2021-03-14 NOTE — Telephone Encounter (Signed)
RETURN CALL: Voicemail - Detailed Message      SUBJECT:  Refill Request    NAME OF MEDICATION(S): pen needle (BD Pen Needle Short U/F) 31G X 8 MM   DATE NEEDED BY: asap   PRESCRIBING PROVIDER: [name], MD   PHARMACY NAME/LOCATION: [facility]   [address] [place] [address]   Phone:  [phone] Fax:  [phone]   DEA #:  [id number]  ADDITIONAL INFORMATION: patient reached out to pharmacy to get refill, but was told to call [name], MD and have clinic assist with refill request for the above medication    Please call patient to assist

## 2021-03-20 ENCOUNTER — Other Ambulatory Visit (HOSPITAL_BASED_OUTPATIENT_CLINIC_OR_DEPARTMENT_OTHER): Payer: Self-pay | Admitting: Internal Medicine

## 2021-03-20 ENCOUNTER — Other Ambulatory Visit (HOSPITAL_BASED_OUTPATIENT_CLINIC_OR_DEPARTMENT_OTHER): Payer: Self-pay

## 2021-03-20 DIAGNOSIS — E1165 Type 2 diabetes mellitus with hyperglycemia: Secondary | ICD-10-CM

## 2021-03-20 DIAGNOSIS — Z794 Long term (current) use of insulin: Secondary | ICD-10-CM

## 2021-03-20 MED ORDER — TECHLITE PEN NEEDLES 31G X 8 MM MISC
1.0000 | Freq: Two times a day (BID) | 6 refills | Status: DC
Start: 2021-03-20 — End: 2021-05-13
  Filled 2021-03-20: qty 200, 100d supply, fill #0

## 2021-03-20 NOTE — Progress Notes (Signed)
PEN refill.

## 2021-03-22 ENCOUNTER — Other Ambulatory Visit (HOSPITAL_BASED_OUTPATIENT_CLINIC_OR_DEPARTMENT_OTHER): Payer: Self-pay

## 2021-03-27 ENCOUNTER — Ambulatory Visit: Payer: Medicare HMO | Attending: Internal Medicine | Admitting: Registered"

## 2021-03-27 ENCOUNTER — Encounter (HOSPITAL_BASED_OUTPATIENT_CLINIC_OR_DEPARTMENT_OTHER): Payer: Self-pay

## 2021-03-27 VITALS — Ht 65.75 in | Wt 249.8 lb

## 2021-03-27 DIAGNOSIS — E119 Type 2 diabetes mellitus without complications: Secondary | ICD-10-CM | POA: Insufficient documentation

## 2021-03-27 DIAGNOSIS — Z713 Dietary counseling and surveillance: Secondary | ICD-10-CM | POA: Insufficient documentation

## 2021-03-27 NOTE — Progress Notes (Signed)
Nutrition Note    Clinic:  Endocrinology    Note Type: Followup    Reason For Visit: Diabetes management   Referral Dr Franchot Erichsen 11/28/20    An interpreter was not needed for the visit.    Assessment    Patient Active Problem List   Diagnosis    Obesity, unspecified    Unspecified asthma, with status asthmaticus    Generalized osteoarthrosis, unspecified site    Other hyperlipidemia    Presbyopia    Gout    Irregular heart beat    Achilles tendinosis of right lower extremity    Mild intermittent asthma without complication    Healthcare maintenance    Non-seasonal allergic rhinitis    Subacute massive pulmonary embolism    Type 2 diabetes mellitus with hyperglycemia, with long-term current use of insulin (HCC)    Syncope     Weight History:   Vitals Weight (lbs) BMI (kg/m2)   03/27/2021 249 lb 12.8 oz 40.63     Adult BMI Classification: obese categ. III (>40)     Last 2RD visits  Vitals Weight (lbs)   02/06/2021 243 lb 9.6 oz     Vitals Weight (lbs)   01/02/2021 239 lb 14.4 oz     Initial RD visit  Vitals Weight (lbs)   11/28/2020 248 lb     Labs:   DIABETES TRACKING 11/04/2020   A1C 17.5 (H)     Self monitored blood glucose: checking 2x/d before breakfast and before dinner.  03/27/21: Avg '112mg'$ /dL, SD '31mg'$ /dL  Fasting 86- '184mg'$ /dL  Before dinner: 75- '140mg'$ /dL    Previous  7/7-7/20/22: Avg '103mg'$ /dL, SD '33mg'$ /dL  Fasting 73- '188mg'$ /dL (avg '145mg'$ /dL)  Before dinner: 67- '105mg'$ /dL (avg '77mg'$ /dL)    6/2- 01/02/21: 43- '136mg'$ /dL, avg '92mg'$ /dL    Medications include:  70/30  40 units am amd 20 units pm Per [name], he no longer feels lightheaded when he gets home in the afternoon since lowering morning dose from 44 units to 40 units    Vitamins/Minerals/Herbal Supplements: not discussed    Barriers to adequate intake: none    GI tolerance: None    Activity:  Most days goes fishing, some walking involved    Food intolerances/allergies: None reported    Diet History:   Pt reports consuming 2 meals/d:   B:  raisin bran with  unsweetened almond milk or eggs and toast  L: none  D: 4-5p eats boiled chicken/turkey hotdog/fish,veg/ salad;  Red beans and rice, sandwiches    S: pecans, crackers with cheese, grapes  Bev: water throughout the day, Carbmaster brand chocolate milk, apple juice had previously said he only drank when low but sounds like may be drinking more often ; asks about Brisk Iced Tea and Lemonade    Likes watermelon but trying to avoid  Likes pie tries to find sugar-free blueberry or cherry pie    Information from Patient:   Friend told him he got rid of his diabetes by drinking mothers vinegar (guessing this means apple cider vinegar that has the mother)    Previous  -Learning more about nutrition management helpful- better in small chunks of information  -Would like to know what he needs to do to not need insulin  -Pt had many questions about individual foods based on videos he'd seen on YouTube (e.g. sourdough, gluten, olive oil, tomatoes).     Assessment Summary: 70 year old male w/history of asthma, mild CKD, gouthere.  Patient was hospitalized 4/14 - 11/05/20 for syncope,  PE, NSTEMI, Hyoxic resp failure, HHS, Newly diagnosed DM,  AKI  .    Nutrition Diagnosis: Food and nutrition related knowledge deficit evidenced by pt questions and report.    Diabetes management improved based on SMBG, values before dinner not as low since, decreasing insulin dose at breakfast     Interventions and Patient Goals:   - reviewed difference in total carbohydrate and sugar- encouraged him to pay attention to the total carbohydrate content  - let him know there is some research suggesting benefits for diabetes and weight management with vinegar. However not conclusive and it will not get rid of diabetes just help to control.  It is not harmful so fine to include if he wishes.     Previous  -recommended decreasing insulin before breakfast to 40 units (from 44) if he is not including any carbohydrate, ok to continue w/ 44 if he does have  carb with this meal.  Continue w/   20 units before dinner  -reviewed carbohydrate managed diet, sources of carbohydrate, plate method  - reviewed label reading  -Reviewed sources of CHO and plate method for balancing meals. Pt voiced understanding of the need to limit but not eliminate CHO.  -Commended pt on increased vegetable intake at meals, switching to stevia for tea, and use of whole grains    Education Provided:  Please refer to Education section of chart     Education materials provided:     Previous  My Food Plan  Insulin instructions in AVS    Time Spent with Patient: 30 mins    Plan/Recommendations    Follow up: 05/08/21    Plan for next visit: answer questions, review SMBG    Total Time Spent:  45 mins      Ed Blalock, MS, RD, CD, [facility] Dietitian  Department mobile: [phone]. [phone]  Email: [email or web address]

## 2021-05-08 ENCOUNTER — Encounter (HOSPITAL_BASED_OUTPATIENT_CLINIC_OR_DEPARTMENT_OTHER): Payer: Self-pay

## 2021-05-08 ENCOUNTER — Ambulatory Visit: Payer: Medicare HMO | Attending: Internal Medicine | Admitting: Registered"

## 2021-05-08 VITALS — Ht 65.75 in | Wt 247.8 lb

## 2021-05-08 DIAGNOSIS — E119 Type 2 diabetes mellitus without complications: Secondary | ICD-10-CM | POA: Insufficient documentation

## 2021-05-08 DIAGNOSIS — Z713 Dietary counseling and surveillance: Secondary | ICD-10-CM | POA: Insufficient documentation

## 2021-05-08 NOTE — Progress Notes (Addendum)
Nutrition Note    Clinic:  Endocrinology    Note Type: Followup    Reason For Visit: Diabetes management   Referral Dr Franchot Erichsen 11/28/20    An interpreter was not needed for the visit.    Assessment    Patient Active Problem List   Diagnosis    Obesity, unspecified    Unspecified asthma, with status asthmaticus    Generalized osteoarthrosis, unspecified site    Other hyperlipidemia    Presbyopia    Gout    Irregular heart beat    Achilles tendinosis of right lower extremity    Mild intermittent asthma without complication    Healthcare maintenance    Non-seasonal allergic rhinitis    Subacute massive pulmonary embolism    Type 2 diabetes mellitus with hyperglycemia, with long-term current use of insulin (HCC)    Syncope     Weight History:       05/08/2021      Weight (lbs) 247 lb 12.8 oz       Adult BMI Classification: obese categ. III (>40)     Last 2RD visits  Vitals Weight (lbs)   03/27/2021 249 lb 12.8 oz     Vitals Weight (lbs)   02/06/2021 243 lb 9.6 oz     Initial RD visit  Vitals Weight (lbs)   11/28/2020 248 lb     Labs:   DIABETES TRACKING 11/04/2020   A1C 17.5 (H)     Self monitored blood glucose: checking 2x/d before breakfast and before dinner.    10/6- 05/08/21: Avg 113mg /dL, SD  Fasting: 97- 163mg /dL (avg 126mg /dL)  Before dinner: 70- 137mg /dL (avg 104mg ./dL)    Previous  03/27/21: Avg 112mg /dL, SD 31mg /dL  Fasting 86- 184mg /dL  Before dinner: 75- 140mg /dL    7/7-7/20/22: Avg 103mg /dL, SD 33mg /dL  Fasting 73- 188mg /dL (avg 145mg /dL)  Before dinner: 67- 105mg /dL (avg 77mg /dL)    6/2- 01/02/21: 43- 136mg /dL, avg 92mg /dL    Medications include:  70/30  40 units am amd 20 units pm     Vitamins/Minerals/Herbal Supplements: started using apple cider vinegar 1tsp every other day (friend told him it could help w/ weight and diabetes)    Barriers to adequate intake: none    GI tolerance: None    Activity:  Most days goes fishing, some walking involved    Food intolerances/allergies: None reported    Diet  History:   Pt reports consuming 2 meals/d:   B:  raisin bran with unsweetened almond milk or oatmeal (needs to get more stevia before he can have this); has not been eating eggs w/ sausage and toast, considering eating again  L: none  D: 4-5p eats boiled chicken/turkey hotdog/fish,veg/ salad;  Red beans and rice, sandwiches  Evening snack: couple of handful of grapes; still sugar-free cherry pie    Beverages: water, not drinking milk 2/2 causes dry mouth    Previous:   S: pecans, crackers with cheese, grapes  Bev: water throughout the day, Carbmaster brand chocolate milk, apple juice had previously said he only drank when low but sounds like may be drinking more often ; asks about Brisk Iced Tea and Lemonade  Likes watermelon but trying to avoid  Likes pie tries to find sugar-free blueberry or cherry pie    Information from Patient:   Sleeping pretty well    Previous  -Learning more about nutrition management helpful- better in small chunks of information  -Would like to know what he needs to do to not need  insulin  -Pt had many questions about individual foods based on videos he'd seen on YouTube (e.g. sourdough, gluten, olive oil, tomatoes).     Assessment Summary: 70 year old male w/history of asthma, mild CKD, gouthere.  Patient was hospitalized 4/14 - 11/05/20 for syncope, PE, NSTEMI, Hyoxic resp failure, HHS,  DM diagnosed 10/2020,  AKI  .    Nutrition Diagnosis: Food and nutrition related knowledge deficit evidenced by pt questions and report.  Pt benefits from repeated education regarding carbohydrate managed diet especially concept of carbohydrate vs sugar.   Diabetes management improved based on SMBG, possibly could decrease morning dose of insulin as glucose before dinner low 100's.    Slight weight loss 2lb, pt attributes to cider vinegar stating no other changes.      Interventions and Patient Goals:   - reviewed difference in total carbohydrate and sugar- encouraged him to pay attention to the total  carbohydrate content  - suggested lowering morning insulin to 38 units.  If glucose stays below 150mg /dL prior to dinner ok to keep at 38 units.  If it rises above 150mg /dL, return to 40 units.      Previous  - let him know there is some research suggesting benefits for diabetes and weight management with vinegar. However not conclusive and it will not get rid of diabetes just help to control.  It is not harmful so fine to include if he wishes.  -recommended decreasing insulin before breakfast to 40 units (from 44) if he is not including any carbohydrate, ok to continue w/ 44 if he does have carb with this meal.  Continue w/   20 units before dinner  -reviewed carbohydrate managed diet, sources of carbohydrate, plate method  - reviewed label reading  -Reviewed sources of CHO and plate method for balancing meals. Pt voiced understanding of the need to limit but not eliminate CHO.  -Commended pt on increased vegetable intake at meals, switching to stevia for tea, and use of whole grains    Education Provided:  Please refer to Education section of chart     Education materials provided:   Insulin dosage plan     Previous  My Food Plan  Insulin instructions in AVS    Time Spent with Patient: 45 mins    Plan/Recommendations    Follow up: 06/19/21    Plan for next visit: answer questions, review SMBG    Total Time Spent:  60 mins      Ed Blalock, MS, RD, CD, [facility] Dietitian  Department mobile: [phone]. [phone]  Email: [email or web address]

## 2021-05-13 ENCOUNTER — Telehealth (HOSPITAL_BASED_OUTPATIENT_CLINIC_OR_DEPARTMENT_OTHER): Payer: Self-pay | Admitting: Internal Medicine

## 2021-05-13 DIAGNOSIS — M1A3790 Chronic gout due to renal impairment, unspecified ankle and foot, without tophus (tophi): Secondary | ICD-10-CM

## 2021-05-13 DIAGNOSIS — E1165 Type 2 diabetes mellitus with hyperglycemia: Secondary | ICD-10-CM

## 2021-05-13 DIAGNOSIS — E785 Hyperlipidemia, unspecified: Secondary | ICD-10-CM

## 2021-05-13 DIAGNOSIS — R7303 Prediabetes: Secondary | ICD-10-CM

## 2021-05-13 DIAGNOSIS — I2699 Other pulmonary embolism without acute cor pulmonale: Secondary | ICD-10-CM

## 2021-05-13 MED ORDER — ALLOPURINOL 100 MG OR TABS
200.0000 mg | ORAL_TABLET | Freq: Every day | ORAL | 0 refills | Status: DC
Start: 2021-05-13 — End: 2021-08-27

## 2021-05-13 MED ORDER — HUMULIN 70/30 KWIKPEN (70-30) 100 UNIT/ML SC SUPN
PEN_INJECTOR | SUBCUTANEOUS | 0 refills | Status: DC
Start: 2021-05-13 — End: 2021-08-26

## 2021-05-13 MED ORDER — ATORVASTATIN CALCIUM 40 MG OR TABS
ORAL_TABLET | ORAL | 0 refills | Status: DC
Start: 2021-05-13 — End: 2021-08-27

## 2021-05-13 MED ORDER — TECHLITE PEN NEEDLES 31G X 8 MM MISC
1.0000 | Freq: Two times a day (BID) | 0 refills | Status: DC
Start: 2021-05-13 — End: 2021-08-26

## 2021-05-13 NOTE — Telephone Encounter (Signed)
Prescribed at discharge on 11/05/20. Okay under your name now?

## 2021-05-13 NOTE — Telephone Encounter (Signed)
RETURN CALL: Voicemail - Detailed Message      SUBJECT:  Refill Request    NAME OF MEDICATION(S):metformin, eliquis, allopqurinol, atorvastatin, pen needles, humulin, one touch, verio,   DATE NEEDED BY:any   PRESCRIBING PROVIDER: Massie Bougie NAME/LOCATION: [facility]   ADDITIONAL INFORMATION:[facility] wants all the medications refilled

## 2021-05-16 ENCOUNTER — Encounter (HOSPITAL_BASED_OUTPATIENT_CLINIC_OR_DEPARTMENT_OTHER): Payer: Medicare HMO | Admitting: Optometrist

## 2021-05-16 ENCOUNTER — Encounter (HOSPITAL_BASED_OUTPATIENT_CLINIC_OR_DEPARTMENT_OTHER): Payer: Self-pay

## 2021-05-16 MED ORDER — GLUCOSE BLOOD VI STRP
1.0000 | ORAL_STRIP | Freq: Four times a day (QID) | 4 refills | Status: DC
Start: 2021-05-16 — End: 2023-03-20

## 2021-05-16 MED ORDER — METFORMIN HCL 500 MG OR TABS
500.0000 mg | ORAL_TABLET | Freq: Two times a day (BID) | ORAL | 0 refills | Status: DC
Start: 2021-05-16 — End: 2021-08-27

## 2021-05-16 MED ORDER — APIXABAN 5 MG OR TABS
5.0000 mg | ORAL_TABLET | Freq: Two times a day (BID) | ORAL | 4 refills | Status: DC
Start: 2021-05-16 — End: 2021-08-27

## 2021-05-16 NOTE — Telephone Encounter (Signed)
Still open on our end. Please respond.

## 2021-05-19 ENCOUNTER — Telehealth (HOSPITAL_BASED_OUTPATIENT_CLINIC_OR_DEPARTMENT_OTHER): Payer: Self-pay | Admitting: Internal Medicine

## 2021-05-19 DIAGNOSIS — E7849 Other hyperlipidemia: Secondary | ICD-10-CM

## 2021-05-19 DIAGNOSIS — E1165 Type 2 diabetes mellitus with hyperglycemia: Secondary | ICD-10-CM

## 2021-05-19 DIAGNOSIS — I2699 Other pulmonary embolism without acute cor pulmonale: Secondary | ICD-10-CM

## 2021-05-19 NOTE — Telephone Encounter (Signed)
I called and spoke to [name].  I had called to confirm that I had set up an appointment for him on July 04, 2021 at noon.  I also confirmed that he has a follow-up nutrition visit on June 19, 2021.    Items Discussed:    1. Anticoagulation:    I wanted to ask him about his use of apixaban.  He indicated that he has been taking it as prescribed at 5 mg twice daily.  I reviewed with him the reasons for taking this medication and reviewed the notes from the discharge summary from 11/05/2020 as described below.  It appears that the high burden of thrombus in his lungs was unprovoked and therefore I let him know that I would like him to continue using apixaban until such time as we have further identified his risks for recurrence of blood clots.  Review of his echo from 11/01/2020 indicates normal left ventricular size with EF of 68%.  No wall motion abnormalities.  Right ventricle is severely dilated and systolic function severely decreased.  Right atrium is mildly dilated.  Pulmonary artery pressure elevated at 32 mmHg.  He stated that he was aware of this recommendation and is comfortable proceeding.  He does not report complications of the anticoagulation.   I will request a follow-up echo to assess his right heart function. (entered).    11/05/20 Discharge Summary Excerpts:    "#Syncope   #Submassive bilateral PE  #NSTEMI  #RV dysfunction   #Hypoxemic respiratory failure: resolved   #Shock: resolved  On presentationto the ED, he wasnoted to have elevated troponin, low blood pressure, elevated lactate. He was admitted to the MICU.RV dysfucntion noted on bedside ultrasoundconcerning for PEs.CTPE showed "large pulmonary arterial embolic burden involving all lobar arteries of the bilateral lungs".No known risk factors. Had a negative FIT 07/2019.He was treated with half dose TPA on 04/14. His hemodynamics improved, was satting well on RA, shock resolved, and troponin down-trending.Cardiology was  consulted, thoughtNSTEMI thought to be 2/2submassive PE and not likely ACS. He was started onapixaban 10m BIDon 4/15.  - continue apixaban 171mbid through 4/21  - Then apixaban 12m28mid thereafter  - f/u with PCP to determine duration of anticoagulation, likely 6 mo minimum  - f/u with PCP for age appropriate cancer screening and possible hypercoagulable workup  - consider f/u ECHO to monitor right heart strain"    2. Diabetes    I asked him about his blood sugar control having noted that his hemoglobin A1c was 17.5% at the time of his pulmonary emboli and NSTEMI.  He indicated that he takes his blood sugar twice daily.  He uses a glucometer and shares this information with KarSantiago Gladring his nutrition visits.  Most recently his fasting blood sugars have been 97 - 163.  I recommended that we re-check is labs in preparation for his upcoming visits.  He agreed to this and will come in this week.  Labs entered.      He reports that he lost 2 lbs since his prior nutritionist visit.  He attributes his weight loss to "[employer] second language [employer] I gave him positive feedback and let him know that his weight loss may also be related to other positive things his is doing like walking and diet as well.  He expressed appreciation.          Current Outpatient Medications:     Albuterol Sulfate HFA 108 (90 Base) MCG/ACT Inhalation Aero Soln, Inhale  2 puffs by mouth every 6 hours as needed for shortness of breath/wheezing., Disp: 1 Inhaler, Rfl: 6    allopurinol 100 MG tablet, Take 2 tablets (200 mg) by mouth daily., Disp: 180 tablet, Rfl: 0    apixaban 5 MG tablet, Take 1 tablet (5 mg) by mouth 2 times a day., Disp: 60 tablet, Rfl: 4    atorvastatin 40 MG tablet, Take 1 tablet (40 mg) by mouth every evening. To lower cholesterol., Disp: 90 tablet, Rfl: 0    glucometer (OneTouch Verio Flex System) w/Device kit, Use to check blood sugar 4 times a day., Disp: 1 kit, Rfl: 0    blood glucose test strip, Use 1 strip 4  times a day. Use to check blood sugar., Disp: 400 strip, Rfl: 4    colchicine 0.6 MG tablet, Take 2 tablets by mouth at first sign of flare followed by 1 table 1 hour later, Disp: 30 tablet, Rfl: 0    Fluticasone Propionate HFA 110 MCG/ACT Inhalation Aerosol, Inhale 1 puff by mouth every 12 hours., Disp: 1 Inhaler, Rfl: 6    insulin NPH & REGULAR (HumuLIN 70/30 KwikPen) (70-30) 100 UNIT/ML pen-injector, Inject 48 units under the skin every morning before breakfast AND 24 units every evening before dinner., Disp: 21 mL, Rfl: 0    ketoconazole 2 % shampoo, Apply to face and scalp as needed, Disp: 120 mL, Rfl: 3    lancet (OneTouch Delica Plus) 39N, Use 1 to check blood glucose 4 times a day., Disp: 100 each, Rfl: 0    loratadine 10 MG tablet, Take 1 tablet (10 mg) by mouth daily as needed for allergies. (past due for annual exam/follow up appointment), Disp: 30 tablet, Rfl: 2    metFORMIN 500 MG tablet, Take 1 tablet (500 mg) by mouth 2 times a day with meals., Disp: 180 tablet, Rfl: 0    pen needle (TechLite Pen Needles) 31G X 8 MM, Use to inject insulin 2 times a day., Disp: 200 each, Rfl: 0    Triamcinolone Acetonide 0.1 % External Ointment, Apply to affected area on neck 2 times a day., Disp: 15 g, Rfl: 0

## 2021-05-20 ENCOUNTER — Ambulatory Visit: Payer: Medicare HMO | Attending: Internal Medicine

## 2021-05-20 ENCOUNTER — Other Ambulatory Visit (HOSPITAL_BASED_OUTPATIENT_CLINIC_OR_DEPARTMENT_OTHER): Payer: Self-pay | Admitting: Internal Medicine

## 2021-05-20 ENCOUNTER — Telehealth (HOSPITAL_BASED_OUTPATIENT_CLINIC_OR_DEPARTMENT_OTHER): Payer: Self-pay

## 2021-05-20 DIAGNOSIS — Z794 Long term (current) use of insulin: Secondary | ICD-10-CM | POA: Insufficient documentation

## 2021-05-20 DIAGNOSIS — E7849 Other hyperlipidemia: Secondary | ICD-10-CM

## 2021-05-20 DIAGNOSIS — E1165 Type 2 diabetes mellitus with hyperglycemia: Secondary | ICD-10-CM

## 2021-05-20 LAB — COMPREHENSIVE METABOLIC PANEL
ALT (GPT): 17 U/L (ref 10–48)
AST (GOT): 19 U/L (ref 9–38)
Albumin: 4.1 g/dL (ref 3.5–5.2)
Alkaline Phosphatase (Total): 90 U/L (ref 36–161)
Anion Gap: 7 (ref 4–12)
Bilirubin (Total): 0.9 mg/dL (ref 0.2–1.3)
Calcium: 9.4 mg/dL (ref 8.9–10.2)
Carbon Dioxide, Total: 27 meq/L (ref 22–32)
Chloride: 106 meq/L (ref 98–108)
Creatinine: 1.45 mg/dL — ABNORMAL HIGH (ref 0.51–1.18)
Glucose: 49 mg/dL — CL (ref 62–125)
Potassium: 3.7 meq/L (ref 3.6–5.2)
Protein (Total): 7.4 g/dL (ref 6.0–8.2)
Sodium: 140 meq/L (ref 135–145)
Urea Nitrogen: 20 mg/dL (ref 8–21)
eGFR by CKD-EPI 2021: 52 mL/min/{1.73_m2} — ABNORMAL LOW (ref >59)

## 2021-05-20 LAB — LIPID PANEL
Cholesterol (LDL): 62 mg/dL (ref <130)
Cholesterol/HDL Ratio: 2.7
HDL Cholesterol: 45 mg/dL (ref >39)
Non-HDL Cholesterol: 77 mg/dL (ref 0–159)
Total Cholesterol: 122 mg/dL (ref <200)
Triglyceride: 76 mg/dL (ref <150)

## 2021-05-20 LAB — ALBUMIN/CREATININE RATIO, RANDOM URINE
Albumin (Micro), URN: 2.05 mg/dL
Albumin/Creatinine Ratio, URN: 7 mg/g{creat} (ref <30)
Creatinine/Unit, URN: 280 mg/dL

## 2021-05-20 NOTE — Telephone Encounter (Addendum)
RN received critical value from lab: pt blood glucose is 49.    RN informed Dr. Franchot Erichsen verbally of critical value. Dr. Franchot Erichsen called pt with RN present. Dr. Franchot Erichsen requested pt to recheck blood glucose -- per pt, fingerstick recheck is 64.    Per pt, can feel that his blood sugar is low, feeling drowsy.     Pt expressing he does not have much food at home, getting food stamps tomorrow. Pt has apple juice and jam at home. Pt advised by Dr. Franchot Erichsen to drink apple juice and have some bread and jam, then recheck blood sugar in ~15 minutes.    Dr. Franchot Erichsen continued phone call with patient, RN left room after above information.    Note forwarded to PCP.      Dr. Franchot Erichsen Notes:  I spoke to Mr. Brannan at approximately 6 p.m. and 10 p.m. on 05/20/21.  He answered promptly each time.  At 6 p.m. call he indicated that he had taken his BS after eating jam and bread and 2 meat sandwichs and it was 94.  He did indicate that he had taken his evening insulin (20 units).  I reminded him that that was a lot of insulin given the low value from his blood draw and I wanted him to eat more and check is BS another couple times.   He agreed to this.     At 10 p.m. he stated he was feeling well.  He reported that his BS at 9:15 p.m. was 96.  He indicated that he had been running low on food and over the past 5 days his blood sugar has been in the 70's each morning.  He has been taking 70/30 40 units/20 units during this time.  I asked him to try to take in more calories this evening and to check his BS before bed which he agreed to.  He stated he still had some oatmeal, eggs, salmon and chicken noodle soup. He stated the would eat oatmeal and the soup and would check his BS again later this evening.  He stated he was feeling well, and felt that he would be fine using this approach.        I reminded him to check his BS in the morning before dosing his insulin.   I stated I would call him back in the morning.         05/21/21 7 a.m.  Phone call by Dr. Franchot Erichsen  to patient.  He awoke and answered the call promptly, said he was feeling his usual self.  Plans to take BS in about one hour and will consider adjustment to his insulin based on that reading.  I asked if he would call me with the BS result.  I let him know our team would be calling him this morning to assist with diabetes management.      He called me back at 7:50 a.m. reporting fasting BS at 92.  I asked that he not take his insulin until our clinic team reaches out to him.  It would be in about 20-30 min.  He confirmed his understanding.

## 2021-05-21 ENCOUNTER — Other Ambulatory Visit (HOSPITAL_BASED_OUTPATIENT_CLINIC_OR_DEPARTMENT_OTHER): Payer: Self-pay | Admitting: Internal Medicine

## 2021-05-21 ENCOUNTER — Ambulatory Visit: Payer: Medicare HMO

## 2021-05-21 DIAGNOSIS — Z794 Long term (current) use of insulin: Secondary | ICD-10-CM

## 2021-05-21 LAB — HEMOGLOBIN A1C, HPLC: Hemoglobin A1C: 6.1 % — ABNORMAL HIGH (ref 4.0–6.0)

## 2021-05-21 MED ORDER — GLUCOSE 4 G OR CHEW
16.0000 g | CHEWABLE_TABLET | ORAL | 3 refills | Status: DC | PRN
Start: 2021-05-21 — End: 2022-07-04

## 2021-05-21 NOTE — Progress Notes (Signed)
Glucose tabs orders entered.

## 2021-05-21 NOTE — Progress Notes (Signed)
Nurse Care Management Brief Note:    Visit type: Appointed    Location: phone    The language the patient speaks: [place]   Interpreter present: None    Other(s) present:  NO    Summary: Referred by Dr. Franchot Erichsen to call [name] re: recent hypoglycemia.  Ran out of food early past few days    Diabetes type 2 diabetes   DATE OF DISCHARGE: 11/05/2020  DISCHARGE TEAM & ATTENDING: Med Hospitalist 2 Adult & [name], *     ADMISSION DIAGNOSIS: syncope  DISCHARGE DIAGNOSIS:   Syncope  Submassive bilateral pulmonary emboli  Hypoxemic respiratory failure, resolved  NSTEMI secondary to right heart   Hyperglycemic hyperosmolar syndrome  Newly diagnosed diabetes mellitus  Acute kidney injury  Left knee gout flare    On NPH/Reg mix 40 units am 20 units pm.  Hemoglobin A1C (%)   Date Value   05/20/2021 6.1 (H)   11/04/2020 17.5 (H)   11/01/2020 17.1 (H)       Assessment: Plan   High risk for recurrent hypoglycemia on NPH/Reg mix, does not eat lunch.  Resistant to change insulin dose or strength.    Strict hypoglycemia precautions: request order for glucose tablets.  Will benefit from support and education to improve diabetes self-management.    BMI 40, recent NSTEMI, consider add weekly GLP1 RA   Agrees to phone follow up 1 week.  Agrees to call for BG pattern < 100    Food security needs further assessment, send message to Dr. Forde Dandy.    Next Follow up:   Date Visit Type Length Department Provider     05/28/2021 2:00 PM PHONE VISIT 60 min [place] at: [facility], 3rd Floor [phone] CARE MANAGEMENT            06/19/2021 1:45 PM CLINICAL SUPPORT 30 min [facility]   Arrive at: [place] [address] [place]            07/04/2021 12:00 PM RETURN LONG 60 min [place] at: [facility], 3rd Floor [name], MD             07/04/2021 3:30 PM CUPID TTE COMPLETE 15 min [facility] [place] at: [place]: [place] [address] ([employer]) [place]

## 2021-05-28 ENCOUNTER — Ambulatory Visit: Payer: Medicare HMO

## 2021-05-28 DIAGNOSIS — E1165 Type 2 diabetes mellitus with hyperglycemia: Secondary | ICD-10-CM

## 2021-05-28 NOTE — Progress Notes (Signed)
Phone follow up with [name].  Denies recurrent hypoglycemia  Has questions about best pizza to buy.    A/p    High risk for recurrent hypoglycemia on NPH/Reg mix, does not eat lunch. May do better using humalog 75/25 or novolog 70/30.  Resistant to change insulin dose or strength at time of call.    Strict hypoglycemia precautions: request order for glucose tablets.  Will benefit from support and education to improve diabetes self-management.    BMI 40, recent NSTEMI, consider add weekly GLP1 RA   Agrees to follow up with RN CM 12/15 around visit with Dr. Franchot Erichsen.  Agrees to call for BG pattern < 100    Food security needs further assessment, send message to Dr. Forde Dandy, 11/30  Recommend to take photos of nutrition facts of favorite pizza and other foods for review at visit with [name].

## 2021-06-05 ENCOUNTER — Other Ambulatory Visit (HOSPITAL_BASED_OUTPATIENT_CLINIC_OR_DEPARTMENT_OTHER): Payer: Self-pay

## 2021-06-05 DIAGNOSIS — B350 Tinea barbae and tinea capitis: Secondary | ICD-10-CM

## 2021-06-07 MED ORDER — KETOCONAZOLE 2 % EX SHAM
MEDICATED_SHAMPOO | CUTANEOUS | 1 refills | Status: DC
Start: 2021-06-07 — End: 2022-11-10

## 2021-06-19 ENCOUNTER — Ambulatory Visit: Payer: Medicare HMO | Attending: Internal Medicine | Admitting: Registered"

## 2021-06-19 VITALS — Ht 65.35 in | Wt 247.2 lb

## 2021-06-19 DIAGNOSIS — Z713 Dietary counseling and surveillance: Secondary | ICD-10-CM | POA: Insufficient documentation

## 2021-06-19 DIAGNOSIS — E119 Type 2 diabetes mellitus without complications: Secondary | ICD-10-CM | POA: Insufficient documentation

## 2021-06-19 NOTE — Patient Instructions (Signed)
Insulin Recommendations  Morning: 35 units.   If glucose reading before dinner is higher than 150 increase morning dose to 40units    Evening: 20 units      If glucose is less than 70 eat 3-5 glucose pills or spoonful of jam

## 2021-06-21 NOTE — Progress Notes (Signed)
Nutrition Note    Clinic:  Endocrinology    Note Type: Followup    Reason For Visit: Diabetes management   Referral Dr Franchot Erichsen 11/28/20    An interpreter was not needed for the visit.    Assessment    Patient Active Problem List   Diagnosis    Obesity, unspecified    Unspecified asthma, with status asthmaticus    Generalized osteoarthrosis, unspecified site    Other hyperlipidemia    Presbyopia    Gout    Irregular heart beat    Achilles tendinosis of right lower extremity    Mild intermittent asthma without complication    Healthcare maintenance    Non-seasonal allergic rhinitis    Subacute massive pulmonary embolism    Type 2 diabetes mellitus with hyperglycemia, with long-term current use of insulin (HCC)    Syncope     Weight History:      Weight (lbs) BMI (Calculated)   06/19/2021 247 lb 3.2 oz !  40.78       Adult BMI Classification: obese categ. III (>40)    Last 2RD visits  05/08/21 247 lb 12.8 oz       Vitals Weight (lbs)   03/27/2021 249 lb 12.8 oz     Initial RD visit  Vitals Weight (lbs)   11/28/2020 248 lb     Labs:    DIABETES [facility] AMB    A1C   Latest Ref Rng 4.0 - 6.0 %   11/04/2020 17.5 (H)    05/20/2021 6.1 (H)         Self monitored blood glucose: checking 2x/d before breakfast and before dinner.  06/19/21: Avg 110mg /dL, Fasting 76- 174mg /dL; before dinner 49- 110mg /dL    Previous  10/6- 05/08/21: Avg 113mg /dL,Fasting: 97- 163mg /dL (avg 126mg /dL); Before dinner: 70- 137mg /dL (avg 104mg ./dL)  03/27/21: Avg 112mg /dL, Fasting 86- 184mg /dL; Before dinner: 75- 140mg /dL  7/7-7/20/22: Avg 103mg /dL, Fasting 73- 188mg /dL (avg 145mg /dL; Before dinner: 67- 105mg /dL (avg 77mg /dL)    Medications include:  70/30  40 units am and 20 units pm (suggested decreasing to 35 units w/ breakfast if before dinner readings less than 150mg /dL)    Vitamins/Minerals/Herbal Supplements: started using apple cider vinegar 1tsp every other day (friend told him it could help w/ weight and diabetes)    Barriers to adequate  intake: none    GI tolerance: None    Activity:  Most days goes fishing, some walking involved    Food intolerances/allergies: None reported    Diet History:   Pt reports consuming 2 meals/d: more sandwiches,     Previous  B:  raisin bran with unsweetened almond milk or oatmeal (needs to get more stevia before he can have this); has not been eating eggs w/ sausage and toast, considering eating again  L: none  D: 4-5p eats boiled chicken/turkey hotdog/fish,veg/ salad;  Red beans and rice, sandwiches  Evening snack: couple of handful of grapes; still sugar-free cherry pie    Beverages: water, not drinking milk 2/2 causes dry mouth    Bev: water throughout the day, [employer] brand chocolate milk, apple juice had previously said he only drank when low but sounds like may be drinking more often ; asks about Brisk Iced Tea and Lemonade  Likes watermelon but trying to avoid  Likes pie tries to find sugar-free blueberry or cherry pie    Information from Patient:   Kyrin misunderstood instructions for insulin at our last visit and decreased morning dose to 38 units if  his glucose was over 150mg /dL before dinner vs less than 150mg /dL     Previous  -Learning more about nutrition management helpful- better in small chunks of information  -Would like to know what he needs to do to not need insulin  -Pt had many questions about individual foods based on videos he'd seen on YouTube (e.g. sourdough, gluten, olive oil, tomatoes).     Assessment Summary: 70 year old male w/history of asthma, mild CKD, gouthere.  Patient was hospitalized 4/14 - 11/05/20 for syncope, PE, NSTEMI, Hyoxic resp failure, HHS,  DM diagnosed 10/2020,  AKI  .    Nutrition Diagnosis: Food and nutrition related knowledge deficit evidenced by pt questions and report.  Pt benefits from repeated education regarding carbohydrate managed diet especially concept of carbohydrate vs sugar.   Diabetes management improved based on SMBG, however, having significant lows  so would recommend decreasing insulin doses     Previous  Slight weight loss 2lb, pt attributes to cider vinegar stating no other changes.      Interventions and Patient Goals:   Insulin Recommendations  Decease morning dose to 35 units.   If glucose reading before dinner is higher than 150mg /dL,  increase morning dose to 40units  Evening: 20 units      Reviewed hypoglycemia treatment: If glucose is less than 70 eat 3-5 glucose pills or spoonful of jam    Previous  - let him know there is some research suggesting benefits for diabetes and weight management with vinegar. However not conclusive and it will not get rid of diabetes just help to control.  It is not harmful so fine to include if he wishes.  -reviewed carbohydrate managed diet, sources of carbohydrate, plate method  - reviewed label reading  -Reviewed sources of CHO and plate method for balancing meals. Pt voiced understanding of the need to limit but not eliminate CHO.  -Commended pt on increased vegetable intake at meals, switching to stevia for tea, and use of whole grains    Education Provided:  Please refer to Education section of chart     Education materials provided:   Insulin dosage plan on AVS    Previous  My Food Plan  Insulin instructions in AVS    Time Spent with Patient: 45 mins    Plan/Recommendations    Follow up:08/21/21    Plan for next visit: answer questions, review SMBG    Total Time Spent:  60 mins      Ed Blalock, MS, RD, CD, [facility] Dietitian  Department mobile: [phone]  Email: [email or web address]

## 2021-06-28 ENCOUNTER — Other Ambulatory Visit (HOSPITAL_BASED_OUTPATIENT_CLINIC_OR_DEPARTMENT_OTHER): Payer: Self-pay

## 2021-07-04 ENCOUNTER — Ambulatory Visit (HOSPITAL_BASED_OUTPATIENT_CLINIC_OR_DEPARTMENT_OTHER): Payer: Medicare HMO

## 2021-07-04 ENCOUNTER — Ambulatory Visit: Payer: Medicare HMO | Attending: Internal Medicine | Admitting: Internal Medicine

## 2021-07-04 ENCOUNTER — Encounter (HOSPITAL_COMMUNITY): Payer: Medicare HMO

## 2021-07-04 VITALS — BP 116/64 | HR 65 | Temp 97.2°F | Ht 65.0 in | Wt 253.4 lb

## 2021-07-04 DIAGNOSIS — M1000 Idiopathic gout, unspecified site: Secondary | ICD-10-CM | POA: Insufficient documentation

## 2021-07-04 DIAGNOSIS — E7849 Other hyperlipidemia: Secondary | ICD-10-CM | POA: Insufficient documentation

## 2021-07-04 DIAGNOSIS — Z23 Encounter for immunization: Secondary | ICD-10-CM | POA: Insufficient documentation

## 2021-07-04 DIAGNOSIS — Z0000 Encounter for general adult medical examination without abnormal findings: Secondary | ICD-10-CM | POA: Insufficient documentation

## 2021-07-04 DIAGNOSIS — R972 Elevated prostate specific antigen [PSA]: Secondary | ICD-10-CM | POA: Insufficient documentation

## 2021-07-04 DIAGNOSIS — I2699 Other pulmonary embolism without acute cor pulmonale: Secondary | ICD-10-CM | POA: Insufficient documentation

## 2021-07-04 DIAGNOSIS — Z794 Long term (current) use of insulin: Secondary | ICD-10-CM | POA: Insufficient documentation

## 2021-07-04 DIAGNOSIS — E1165 Type 2 diabetes mellitus with hyperglycemia: Secondary | ICD-10-CM | POA: Insufficient documentation

## 2021-07-04 MED ORDER — COVID-19MRNA BIVAL VACC PFIZER 30 MCG/0.3ML IM SUSP
30.0000 ug | Freq: Once | INTRAMUSCULAR | Status: AC
Start: 2021-07-04 — End: 2021-07-04
  Administered 2021-07-04: 30 ug via INTRAMUSCULAR

## 2021-07-04 NOTE — Progress Notes (Signed)
COVID-19 Vaccine Intake Documentation      Pre-Vaccination Screening Questions:         1.  Are you feeling sick today?     NO       2. Have you ever received a dose of COVID-19 vaccine?     YES         If yes, which vaccine product?   Pfizer         3.  Have you  ever had a severe allergic reaction    (e.g., anaphylaxis) to something?  For example, a reaction for    which you were treated with epinephrine or Epi Pen  or    for which you had to go to the hospital?       NO        Was the severe allergic reaction after receiving a COVID-19 vaccine?        NO      Was the severe allergic reaction after receiving another vaccine or another injectable medication?     NO       4. Have you received passive antibody treatment    (monoclonal antibodies or convalescent serum)     as a treatment for COVID-19?     NO       5. Do you have a weakened immune system caused    by something such as HIV infection or cancer, or do    you take immunosuppressive drugs or therapies?     NO       6. Do you have a bleeding disorder or are you taking a    blood thinner?     NO      7. Are you pregnant or breastfeeding?     NO       8. Does the patient meet the age requirements for the    vaccine?         *Pfizer is 12 years and older, Levan Hurst and      [place] are 18 years and older.             Yes      9. If this is the patient's second dose, has the minimum    timeframe passed?      *Pfizer can be given at 21 days and Levan Hurst can be      given at 28 days.       N/A   Patient tolerated it well. Patient left without any complication.    Patient was given the FIT test. Patient has done test before and needed no instruction. Exp. 07/10/22

## 2021-07-04 NOTE — Progress Notes (Signed)
Future Appointments   Date Time Provider [place]   07/04/2021 12:30 PM H042 CARE MANAGEMENT H Adlt20 [facility] AM   07/04/2021  3:30 PM H026 ECHO CLINIC H ECHO [facility] RADIOLOG   08/21/2021  1:45 PM H037 NUTRITIONIST [id number] [facility] MS         BP 116/64    Pulse 65    Temp 36.2 C (Temporal)    Ht 5\' 5"  (1.651 m)    Wt (!) 114.9 kg (253 lb 6.4 oz)    SpO2 98%    BMI 42.17 kg/m       NAD  Interactive  HEENT:  Anicteric   NECK:  No JVD, No Carotid Bruits, No TMG  Nodes: No cervical  LUNGS:  Clear to auscultation, no wheezing  COR:  RRR w/o murmur  ABD:  + BS, Soft, non-tender, no bruits  EXT:  2+ pitting bilateral Edema  Skin: Warm and Dry, no rash  Multiple skin tags on posterior neck and back.        Neuro:  CN's 2-12 intact  Str 5/5 UE and LE symm  F-N intact, Rapid Alt Movements intact bilat  No pronator Drift  Gait stable, Rhomberg Negative, no Ataxia  Str leg raising intact      Foot exam: 07/05/11   - Visual inspection:  normal to inspection, with intact skin, no significant callus formation, no evidence of ischemia.    - Monofilament exam:  Intact    - Pulse exam: DP's symmetric.

## 2021-07-04 NOTE — Progress Notes (Signed)
[facility] - Outpatient Return Clinic Note    Date:  07/04/2021    Nathan [name] is a 70 year old male who is here today for   Chief Complaint   Patient presents with    Follow-Up      INTERVAL HISTORY/HPI:    Nathan [name] is a 70 year old English speakingman w/history of diabetes type 2, asthma, CKD, and gout.      Patient was hospitalized 4/14 - 11/05/20 for syncope, PE, NSTEMI, Hyoxic resp failure, HHS, Newly diagnosed DM,  AKI, gout.      Nathan [name] returns to clinic for ongoing follow-up.  Nathan [name] reports Nathan [name] is doing well and shows me his glucose tabs that Nathan [name] carries with him.  When asked about his blood sugar this morning, Nathan [name] said that Nathan [name] thinks it was 130.  Nathan [name] will have an introductory visit with Denice Bors during today's visit.     Allergies: Grass and cherry blossoms. Nathan [name] reports they have been kicking in late November. Not allergic to ASA    SH: Nathan [name] has been staying at home. Nathan [name] lives at home alone, likes to fish in the warmer months, Nathan [name] states Nathan [name] believes in [place], and lives on a fixed income. Nathan [name] likes to "speak whenspoken to".      Review of remaining HPI, Problem List, Meds, Labs, Health Care Maintenance, and Vaccines are detailed in sections below.       Chart Review:     02/19/21:  Ophthal:  "IMPRESSION/PLAN:  1. 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 Mixed type cataracts, both eyes  - mild visual significance left eye  - monitor  3.    Presbyopia  - Discussed with patient the need for reading glasses as Nathan [name] ages. Nathan [name] understands and is happy to try reading glasses  - recommend +2.00 to +2.50 OTC readers"     PROBLEM LIST: See Epic Problem List    MEDICATIONS:    Current Outpatient Medications:     Albuterol Sulfate HFA 108 (90 Base) MCG/ACT Inhalation Aero Soln, Inhale 2 puffs by mouth every 6 hours as  needed for shortness of breath/wheezing., Disp: 1 Inhaler, Rfl: 6    allopurinol 100 MG tablet, Take 2 tablets (200 mg) by mouth daily., Disp: 180 tablet, Rfl: 0    apixaban 5 MG tablet, Take 1 tablet (5 mg) by mouth 2 times a day., Disp: 60 tablet, Rfl: 4    atorvastatin 40 MG tablet, Take 1 tablet (40 mg) by mouth every evening. To lower cholesterol., Disp: 90 tablet, Rfl: 0    glucometer (OneTouch Verio Flex System) w/Device kit, Use to check blood sugar 4 times a day., Disp: 1 kit, Rfl: 0    blood glucose test strip, Use 1 strip 4 times a day. Use to check blood sugar., Disp: 400 strip, Rfl: 4    colchicine 0.6 MG tablet, Take 2 tablets by mouth at first sign of flare followed by 1 table 1 hour later, Disp: 30 tablet, Rfl: 0    Fluticasone Propionate HFA 110 MCG/ACT Inhalation Aerosol, Inhale 1 puff by mouth every 12 hours., Disp: 1 Inhaler, Rfl: 6    glucose 4 g chewable tablet, Chew and swallow 4 tablets (16 g) by mouth every 15 minutes as needed for low blood sugar. Recheck blood sugar as  needed., Disp: 50 tablet, Rfl: 3    insulin NPH & REGULAR (HumuLIN 70/30 KwikPen) (70-30) 100 UNIT/ML pen-injector, Inject 48 units under the skin every morning before breakfast AND 24 units every evening before dinner., Disp: 21 mL, Rfl: 0    ketoconazole 2 % shampoo, apply topically face and scalp if needed, Disp: 120 mL, Rfl: 1    lancet (OneTouch Delica Plus) 85U, Use 1 to check blood glucose 4 times a day., Disp: 100 each, Rfl: 0    loratadine 10 MG tablet, Take 1 tablet (10 mg) by mouth daily as needed for allergies. (past due for annual exam/follow up appointment), Disp: 30 tablet, Rfl: 2    metFORMIN 500 MG tablet, Take 1 tablet (500 mg) by mouth 2 times a day with meals., Disp: 180 tablet, Rfl: 0    pen needle (TechLite Pen Needles) 31G X 8 MM, Use to inject insulin 2 times a day., Disp: 200 each, Rfl: 0    Triamcinolone Acetonide 0.1 % External Ointment, Apply to affected area on neck 2 times a day.,  Disp: 15 g, Rfl: 0    ALLERGIES:  Review of patient's allergies indicates:  No Known Allergies    PHYSICAL EXAM:    BP 116/64    Pulse 65    Temp 36.2 C (Temporal)    Ht _0  (1.651 m)    Wt (!) 114.9 kg (253 lb 6.4 oz)    SpO2 98%    BMI 42.17 kg/m     NAD  Interactive  HEENT:  Anicteric   NECK:  No JVD, No Carotid Bruits, No TMG  Nodes: No cervical  LUNGS:  Clear to auscultation, no wheezing  COR:  RRR w/o murmur  ABD:  + BS, Soft, non-tender, no bruits  EXT:  2+ pitting bilateral Edema  Skin: Warm and Dry, no rash  Multiple skin tags on posterior neck and back.      Foot exam: 07/05/11   - Visual inspection:  normal to inspection, with intact skin, no significant callus formation, no evidence of ischemia.    - Monofilament exam:  Intact    - Pulse exam: DP's symmetric.     LAB AND IMAGING RESULTS:  Orders Only on 05/20/21   1. Lipid Panel   Result Value Ref Range    Total Cholesterol 122 <200 mg/dL    Triglyceride 76 <150 mg/dL    HDL Cholesterol 45 >39 mg/dL    Cholesterol (LDL) 62 <130 mg/dL    Non-HDL Cholesterol 77 0 - 159 mg/dL    Cholesterol/HDL Ratio 2.7     Lipid Panel, Additional Info. (NOTE)    2. Comprehensive Metabolic Panel   Result Value Ref Range    Sodium 140 135 - 145 meq/L    Potassium 3.7 3.6 - 5.2 meq/L    Chloride 106 98 - 108 meq/L    Carbon Dioxide, Total 27 22 - 32 meq/L    Anion Gap 7 4 - 12    Glucose 49 (LL) 62 - 125 mg/dL    Urea Nitrogen 20 8 - 21 mg/dL    Creatinine 1.45 (H) 0.51 - 1.18 mg/dL    Protein (Total) 7.4 6.0 - 8.2 g/dL    Albumin 4.1 3.5 - 5.2 g/dL    Bilirubin (Total) 0.9 0.2 - 1.3 mg/dL    Calcium 9.4 8.9 - 10.2 mg/dL    AST (GOT) 19 9 - 38 U/L    Alkaline Phosphatase (Total) 90 36 - 161  U/L    ALT (GPT) 17 10 - 48 U/L    eGFR by CKD-EPI 2021 52 (L) >59 mL/min/[1.73_m2]   3. Hemoglobin A1C, HPLC   Result Value Ref Range    Hemoglobin A1C 6.1 (H) 4.0 - 6.0 %   4. Albumin, Random Urine   Result Value Ref Range    Creatinine/Unit, URN 280 mg/dL    Albumin (Micro), URN  2.05 mg/dL    Albumin/Creatinine Ratio, URN 7 <30 mg/g[creat]       ASSESSMENT AND PLAN:    1. Diabetes:  Humulin 70/30 and Metformin  Foot Exam - 11/26/20, 07/04/21 completed.   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  Mixed type cataracts, both eyes, mild. Monitor.      A1C UWM AMB    A1C   Latest Ref Rng 4.0 - 6.0 %   12/26/2019 7.5 (H)    11/01/2020 17.1 (H)    11/04/2020 17.5 (H)    05/20/2021 6.1 (H)       2. Asthma. Nathan [name] uses -Fluticasone 110 mcg BID -Albuterol Q6hr PRNand has a stable pattern. Nathan [name] reports that Nathan [name] has never called 911 or been hospitalized for Asthma. No documentation of PFTs at [facility] or Care Everywhere. PEF on exam today 300 lpm, no wheezing on exam. Will refill meds and refer for PFTs. (not obtained. )    3. Chronic gout. Nathan [name] report intermittent foot pain but is non-specific on interview. Records indicate last flare podagra (L great toe) in June 2017. Nathan [name] is minimizing red meat, and no alcohol. Nathan [name] has CKD therefore goal is to use tylenol over NSAIDs for pain. Nathan [name] is on Allopurinol 200 mg daily, Colchicine 0.6 mg 2 tabs for flare, then 1 tab 1 hr later. Uric Acid 7.4 (07/07/18). CrCl at 50 mL/min, allopurinol and colchicine doses acceptable.ASA has been stopped due to gout and CKD. (stable 11/26/20)    4. Cardiac Risk Factors. BP 124/62 off meds, BMI 47, LDL 96, DM no, No FH CAD, No tobacco. ASCVD Risk 9.8% Atorvastatin 40 MG.ASA has been stopped.Repeat lipids and reassess ASCVD risk and role of ASA.     Cholesterol    LDL Cholesterol   Latest Ref Rng <130 mg/dL   11/07/2015 96    07/11/2019 89    11/01/2020 107    05/20/2021 62      5. CKD. Stable pattern over time.   Creatinine 1.60 - 1.45 mg/dL (05/20/21)  GFR 44 - 52 ml/min (05/20/21)  Prot / Creat <0.1 (02/22/16)   Alb/Creat 7 (05/20/21)    6. Tinea Capitus.Ketoconazole shampoo    HEALTH CARE MAINTANENCE / SCREENING:(See below)    All:  NKA  GFR: 52 (05/20/21)  QTc  = 459     PHQ 2 Screening:p    Colorectal Cancer Screening: FIT negative (07/23/18 and 07/26/19).  Patient declines colonoscopy today.  Nathan [name] will provide a FIT specimen.  (07/04/21)    Tobacco Use:  Quit at age 49.  ETOH Use: Occ use   Marijuana: Occ    CT SCAN / Low Dose Lung CT (Age 35-80, >30 pyrs and quit < 15 yrs ago):    Hepatitis C / HIV Screening:  HIV negative 2013.  Hep C (1945-65 + high risk):Neg 2013    Osteoporosis Screening: (RF: Age, prior Fx, Fam Hx, glucocorticoid, low body weight, Tob, ETOH, RA, Secondary Osteoporosis (hypogonad, menopause, malabs, Liver Dz, IBD). Screening: Women > 68, Women < 65,  if post-menopausal with RFs, Men with low impact Fx or RFs.) FRAX Score:     Prostate Cancer Screening:  No FH of prostate CA.   Nathan [name] elects to obtain a PSA after SDM.  (07/04/21)    AAA Screening (Men 47-75, Any Tob Hx, one time):    Nathan [name] declines a flu vaccine. Bivalent COVID booster. (07/04/21).     Immunization History   Administered Date(s) Administered    COVID-19 Moderna mRNA 12 yrs and older 09/20/2019, 10/18/2019    COVID-19 Pfizer mRNA tris-sucrose 12 yrs and older (gray cap) 11/26/2020    Hepatitis B, unspecified 10/05/2007, 11/05/2007, 05/08/2008    Influenza quadrivalent PF 04/19/2013, 06/12/2014, 05/01/2015    Influenza quadrivalent adjuvanted (Fluad) 05/19/2019    Influenza trivalent PF 07/07/2012    Influenza, unspecified 09/26/2010    Pneumococcal polysaccharide PPSV23 (Pneumovax 23) 02/12/2009    Tdap 01/21/2011     Future Appointments   Date Time Provider [place]   07/04/2021 12:00 PM [name], MD H [facility] AM   07/04/2021 12:30 PM H042 CARE MANAGEMENT H [facility] AM   07/04/2021  3:30 PM H026 ECHO CLINIC H ECHO [facility] RADIOLOG   08/21/2021  1:45 PM H037 NUTRITIONIST [id number] [place]     Total time spent in the direct care of this patient and in reviewing records and documentation on the date of service was 30 minutes.

## 2021-07-04 NOTE — Progress Notes (Signed)
Nurse Care Management Brief Note:    Visit type: Appointed    Location[facility]    The language the patient speaks: [place]   Interpreter present: None    Other(s) present:  NO    Summary: Initial on site visit with Nathan [name] around visit with Dr. Franchot Erichsen.  Diabetes  Injecting NPH /Regular mix 35-40 units am, depending on BG  20 units pm.    Usually does not eat lunch.    Discussion today on:  Target BG/A1C  Recognizing low glucose, treatment, avoid over correcting, when to call clinic.  Timing and action of NPH/Regular insulin.    Assessment: Plan   Diabetes: controlled by BGM.  Hemoglobin A1C (%)   Date Value   05/20/2021 6.1 (H)   11/04/2020 17.5 (H)   11/01/2020 17.1 (H)   H/o hypoglycemia, if recurs, recommend change to novolog or humalog mix.  Consider add GLP1 RA weekly, BMI > 40.  May only need basal insulin if add GLP1 RA and lower risk of hypoglycemia.   Latest Reference Range & Units 05/20/21 14:44   Albumin (Micro), URN mg/dL 2.05   Albumin/Creatinine Ratio, URN <30 mg/gcreat 7     BP Readings from Last 3 Encounters:   07/04/21 116/64   11/26/20 111/77   11/06/20 122/78       Working with Nathan [name] RD in endo clinic.  Agrees to phone follow up in early January.  Advised to call clinic for pattern of BG < 80-90      Next Follow up:    Date Visit Type Length Department Provider    07/25/2021 2:00 PM PHONE VISIT 60 min [place] at: [facility] [place] CARE MANAGEMENT            08/19/2021 2:15 PM CUPID TTE COMPLETE 15 min [facility] [place] at: [place] [facility] 20 H538 ECHO LAB            08/21/2021 1:45 PM CLINICAL SUPPORT 30 min [facility]   Arrive at: [place]

## 2021-07-04 NOTE — Patient Instructions (Addendum)
For today's visit we did a health checkup.     Your foot exam was very good.      I would like to review your medications. I will set up a telephone call to review your medication list.     At a future visit we will remove your skin tags.     Today we will provide a FIT test and a COVID-19 booster.      You have expressed interest in a PSA test after our shared decision making.   You do not have a family history of prostate cancer.      Today you indicated that you did not want a flu vaccine or screening for HIV or Hepatitis C.      Please resume taking your atorvastatin for cholesterol.

## 2021-07-08 ENCOUNTER — Other Ambulatory Visit (HOSPITAL_BASED_OUTPATIENT_CLINIC_OR_DEPARTMENT_OTHER): Payer: Self-pay | Admitting: Internal Medicine

## 2021-07-08 ENCOUNTER — Ambulatory Visit: Payer: Medicare HMO | Attending: Internal Medicine

## 2021-07-08 DIAGNOSIS — Z0000 Encounter for general adult medical examination without abnormal findings: Secondary | ICD-10-CM

## 2021-07-08 LAB — PSA, SCREENING: PSA, Screening: 28.19 ng/mL — ABNORMAL HIGH (ref 0.00–4.00)

## 2021-07-10 ENCOUNTER — Ambulatory Visit: Payer: Medicare HMO | Attending: Internal Medicine

## 2021-07-10 ENCOUNTER — Other Ambulatory Visit (HOSPITAL_BASED_OUTPATIENT_CLINIC_OR_DEPARTMENT_OTHER): Payer: Self-pay | Admitting: Internal Medicine

## 2021-07-10 DIAGNOSIS — Z0000 Encounter for general adult medical examination without abnormal findings: Secondary | ICD-10-CM

## 2021-07-10 DIAGNOSIS — R972 Elevated prostate specific antigen [PSA]: Secondary | ICD-10-CM | POA: Insufficient documentation

## 2021-07-10 NOTE — Addendum Note (Signed)
Addended by: [name] on: 07/10/2021 05:55 PM     Modules accepted: Orders

## 2021-07-10 NOTE — Progress Notes (Signed)
Lab review:  07/08/21  PSA 28.2    I called and spoke with Nathan [name] to provide the PSA results.  I indicated a recommendation to proceed with a Urology referral.  He asked some questions for clarification which I addressed and he agreed to proceed with the referral.      He indicated that he was taking the Atorvastatin 40 mg as prescribed.      He stated his BS was in the 80's.

## 2021-07-11 LAB — OCCULT BLOOD BY IA, STL: Occult Bld 1 Result: NEGATIVE

## 2021-07-12 ENCOUNTER — Encounter (HOSPITAL_BASED_OUTPATIENT_CLINIC_OR_DEPARTMENT_OTHER): Payer: Self-pay | Admitting: Internal Medicine

## 2021-07-12 NOTE — Progress Notes (Signed)
07/10/21:  FIT Negative      Latest Reference Range & Units 02/22/16 22:48 05/04/17 22:23 07/23/18 22:18 07/26/19 01:00 07/10/21 23:55   Occult Bld 1 Result NRN  Negative Negative Negative Negative Negative

## 2021-07-17 ENCOUNTER — Telehealth (HOSPITAL_BASED_OUTPATIENT_CLINIC_OR_DEPARTMENT_OTHER): Payer: Self-pay | Admitting: Internal Medicine

## 2021-07-17 NOTE — Telephone Encounter (Signed)
RETURN CALL: Voicemail - Detailed Message      SUBJECT:  General Message     MESSAGE: PT would like to have additional information on the referral for Urology. Pt doesn't know why he is being referred to Urology. Please reach out to pt.

## 2021-07-21 DIAGNOSIS — C61 Malignant neoplasm of prostate: Secondary | ICD-10-CM

## 2021-07-21 HISTORY — PX: PROSTATE BIOPSY: SHX241

## 2021-07-21 HISTORY — DX: Malignant neoplasm of prostate: C61

## 2021-07-25 ENCOUNTER — Telehealth (HOSPITAL_BASED_OUTPATIENT_CLINIC_OR_DEPARTMENT_OTHER): Payer: Medicare HMO

## 2021-07-30 ENCOUNTER — Ambulatory Visit: Payer: Medicare HMO

## 2021-07-30 DIAGNOSIS — E1165 Type 2 diabetes mellitus with hyperglycemia: Secondary | ICD-10-CM

## 2021-07-30 DIAGNOSIS — Z794 Long term (current) use of insulin: Secondary | ICD-10-CM

## 2021-07-30 NOTE — Progress Notes (Signed)
Nurse Care Management Brief Note:    Visit type: Appointed    Location: phone    The language the patient speaks: [place] present: None    Other(s) present:  NO    Summary: Phone follow up with Nathan [name].   hospitalized 4/14 - 11/05/20 for syncope, PE, NSTEMI, Hyoxic resp failure, HHS, Newly diagnosed DM, AKI, gout.       Diabetes:  Hemoglobin A1C (%)   Date Value   05/20/2021 6.1 (H)   11/04/2020 17.5 (H)   11/01/2020 17.1 (H)     On NPH/Reg mix 35-40 units am  20 units pm  Metformin 1 gm    BGM mostly 100-120 range.  Rare 70's ,    Latest Reference Range & Units 05/20/21 14:04   Sodium 135 - 145 meq/L 140   Potassium 3.6 - 5.2 meq/L 3.7   Chloride 98 - 108 meq/L 106   Carbon Dioxide, Total 22 - 32 meq/L 27   Anion Gap 4 - 12  7   Glucose 62 - 125 mg/dL 49 (LL)   Urea Nitrogen 8 - 21 mg/dL 20   Creatinine 0.51 - 1.18 mg/dL 1.45 (H)   eGFR by CKD-EPI 2021 >59 mL/min/1.73_m2 52 (L)   Calcium 8.9 - 10.2 mg/dL 9.4   (LL): Data is critically low  (H): Data is abnormally high  (L): Data is abnormally low     Latest Reference Range & Units 05/20/21 14:44   Albumin (Micro), URN mg/dL 2.05   Albumin/Creatinine Ratio, URN <30 mg/gcreat 7     BP Readings from Last 3 Encounters:   07/04/21 116/64   11/26/20 111/77   11/06/20 122/78     Elevated PSA: referral to urology pending. Difficulty recall of conversation with Dr. Franchot Erichsen.    Coordination of care: conflict with echocardiagram on 1/30., Wants to change date/time      Assessment: Plan   Diabetes: tight control on NPH/Reg insulin mix,  high risk for hypoglycemia, ,resistant to changing medications,   continue to discuss options with lower risk ,   humalog or novolog mix for any pattern of hypoglycemia. Carry glucose tablets all times.  Call for glucose pattern < 100.    BMI 42, h/o ckd, nstemi, consider use of GLP1 RA weekly, may only need basal insulin with GLP1 RA, consider use of sensor, future.     Low health literacy: difficulty changing echo appointment. Left  message for lab to call Nathan [name] and assist with rescheduling.  Coordination of care.,  Assist with scheduling urology if not done by our next call.     Next follow up:    08/19/2021 2:15 PM CUPID TTE COMPLETE 15 min [facility] [place] at: [place] [facility] 20 H538 ECHO LAB             08/21/2021 1:45 PM CLINICAL SUPPORT 30 min [facility]   Arrive at: [place]

## 2021-08-06 ENCOUNTER — Encounter (HOSPITAL_BASED_OUTPATIENT_CLINIC_OR_DEPARTMENT_OTHER): Payer: Self-pay | Admitting: Internal Medicine

## 2021-08-06 ENCOUNTER — Ambulatory Visit
Admission: RE | Admit: 2021-08-06 | Discharge: 2021-08-06 | Disposition: A | Discharge status: Home / Self Care | Payer: Medicare HMO | Attending: Internal Medicine | Admitting: Internal Medicine

## 2021-08-06 DIAGNOSIS — I2699 Other pulmonary embolism without acute cor pulmonale: Secondary | ICD-10-CM | POA: Insufficient documentation

## 2021-08-06 LAB — TRANSTHORACIC ECHO (TTE) COMPLETE
AoV max: 141.6 cm/s
Ascending aorta: 3.6 cm
E/E' ratio: 7.4
EF: 45.7 %
IVSd: 1.2 cm
LV Diastolic Volume Index (BP): 55.4 ml/m
LV Systolic Volume (BP): 65.1 ml
LVIDd: 5.3 cm
LVIDs: 4 cm
LVPWd: 1.1 cm
RV Free wall pk S': 11.6 cm/s
RVDd: 3.5 cm
Tapse: 1.7 cm

## 2021-08-06 NOTE — Progress Notes (Signed)
08/06/21: TTE:  Conclusion:     Normal LV size with mildly reduced systolic function. Regional wall motion  abnormalities shown below.    Normal RV size and systolic function. Normal RA pressure. Could not evaluate  PA pressure. RV/LV ratio 0.7 which is normal.    Normal valve structure and function.    Compared with prior study from 4/14//22, RV size and systolic function has  improved.

## 2021-08-13 IMAGING — MR MRI RIGHT SHOULDER WITHOUT CONTRAST
4 of 6 series · 26 of 40 positions shown · IV contrast (gadolinium)
Comparison: MR exam of 06/23/2019.

________________________________________________________________________________________________ 
MRI RIGHT SHOULDER WITHOUT CONTRAST, 08/13/2021 [DATE]: 
CLINICAL INDICATION: Complete rotator cuff tear of rupture right shoulder. Right 
shoulder pain with short movements. History of right rotator cuff tear repair. 
Progressing pain for 6 months.
TECHNIQUE: Multiplanar, multiecho position MR images of the shoulder were 
performed without intravenous gadolinium enhancement. Patient was scanned on a 
3T magnet.

[Series 201: survey_right · axial · 10.0mm · 0.99mm/px · z∈[-40,+175]mm · 3 of 15 slices shown]
[im 1/15]
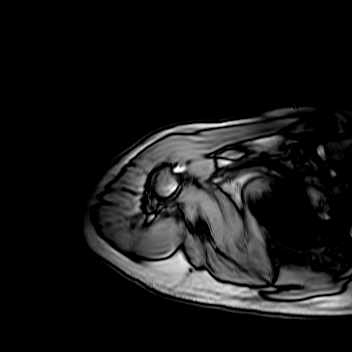
[im 8/15]
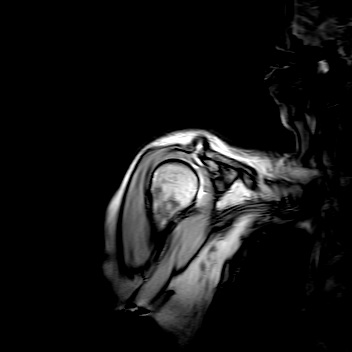
[im 15/15]
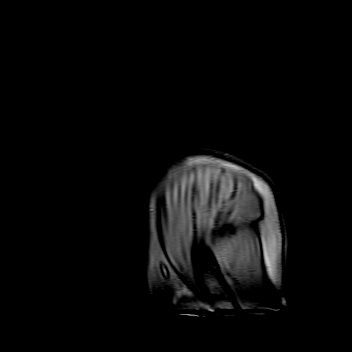

[Series 301: pdw spair ax · axial · 2.2mm · 0.27mm/px · z∈[-20,+72]mm · 9 of 36 slices shown]
[im 1/36]
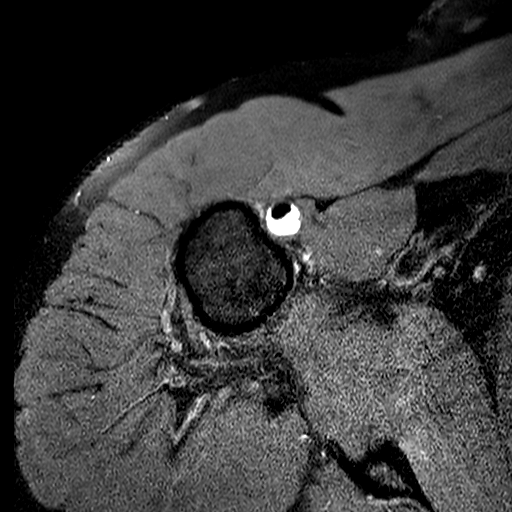
[im 5/36]
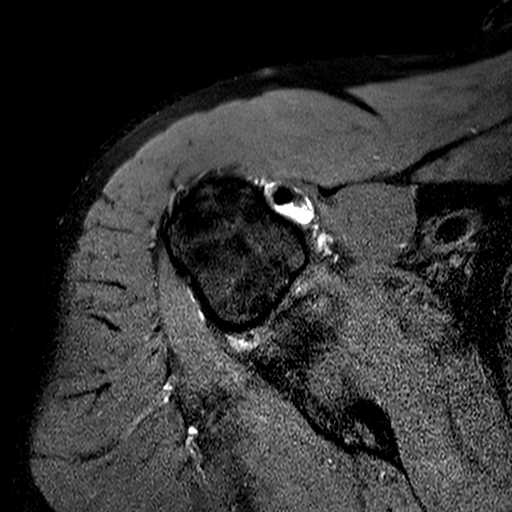
[im 9/36]
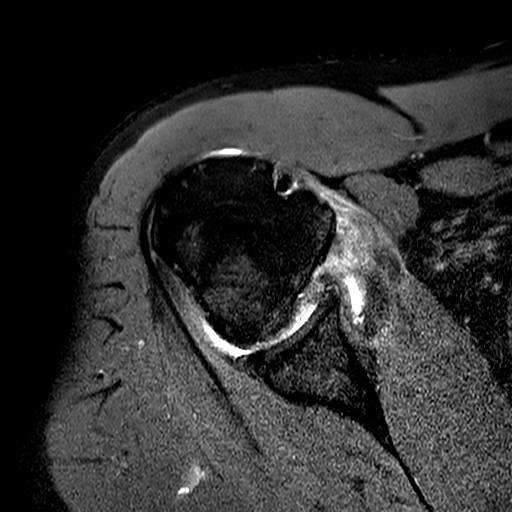
[im 14/36]
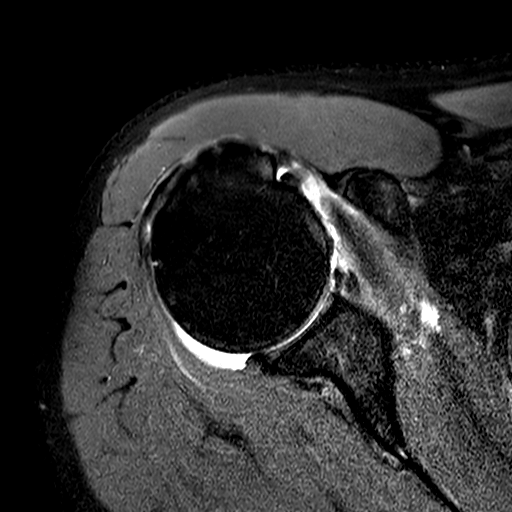
[im 18/36]
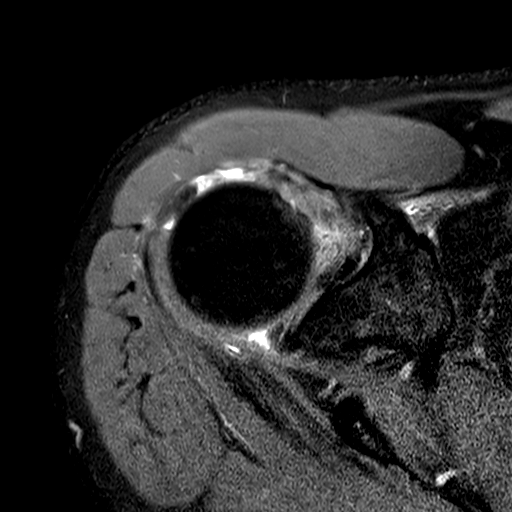
[im 22/36]
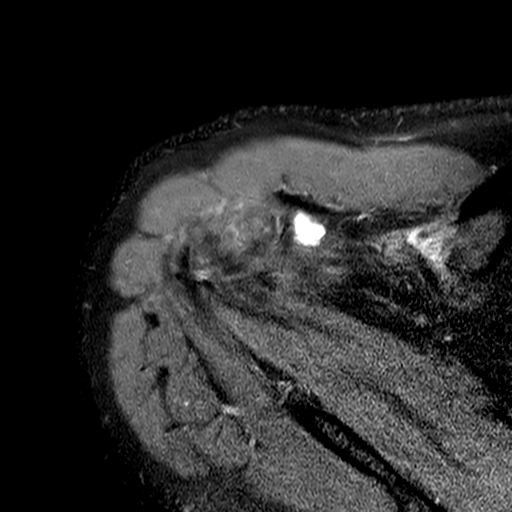
[im 27/36]
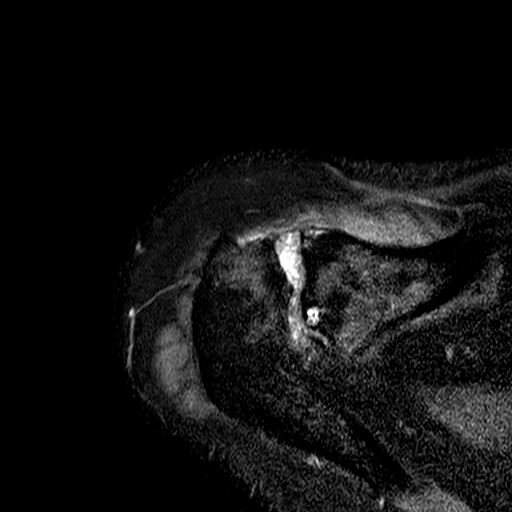
[im 31/36]
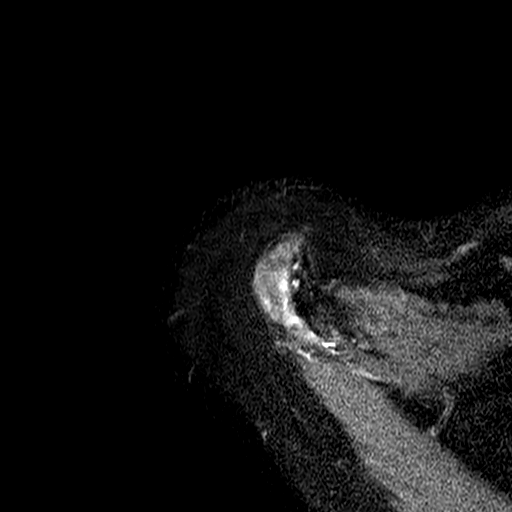
[im 36/36]
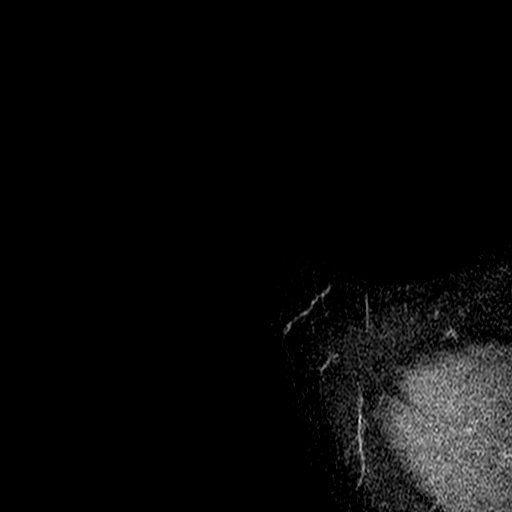

[Series 401: t2w spair sag · oblique · 3.2mm · 0.39mm/px · 7 of 28 slices shown]
[im 1/28]
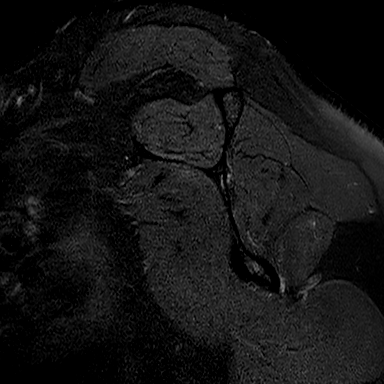
[im 5/28]
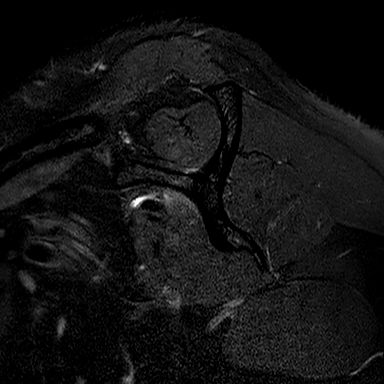
[im 10/28]
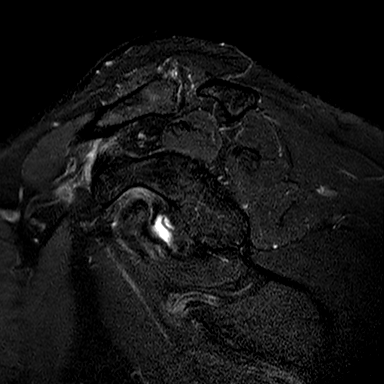
[im 14/28]
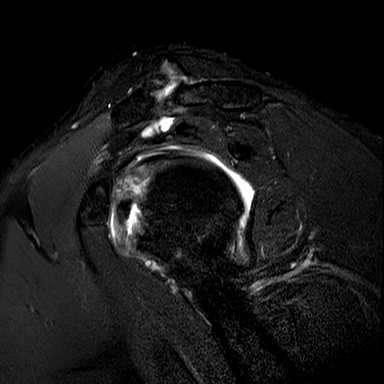
[im 19/28]
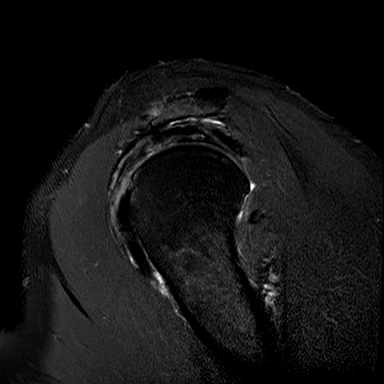
[im 23/28]
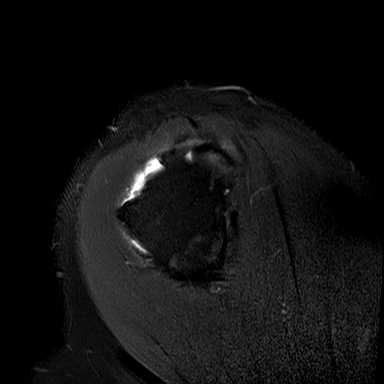
[im 28/28]
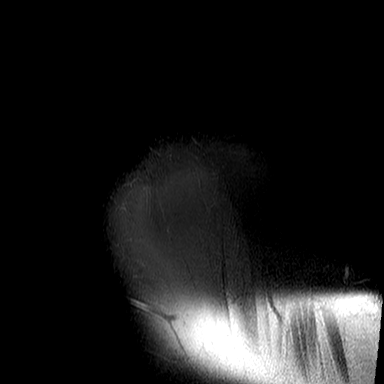

[Series 501: t2w spair cor · oblique · 2.5mm · 0.39mm/px · 7 of 26 slices shown]
[im 1/26]
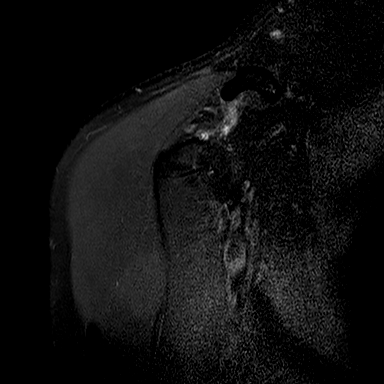
[im 5/26]
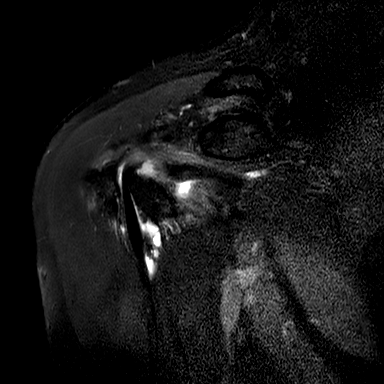
[im 9/26]
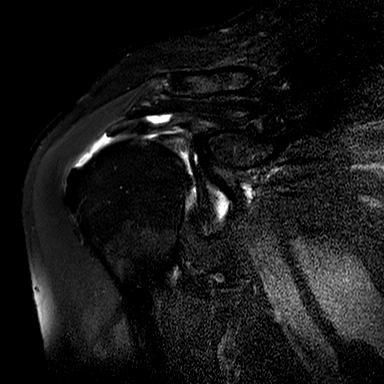
[im 13/26]
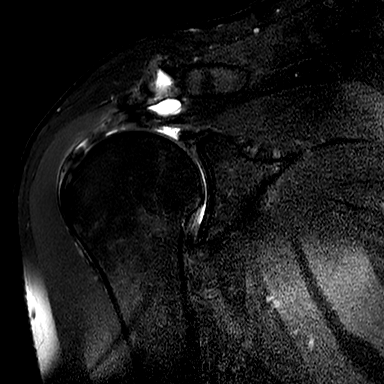
[im 17/26]
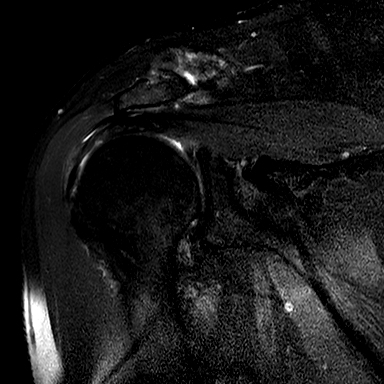
[im 21/26]
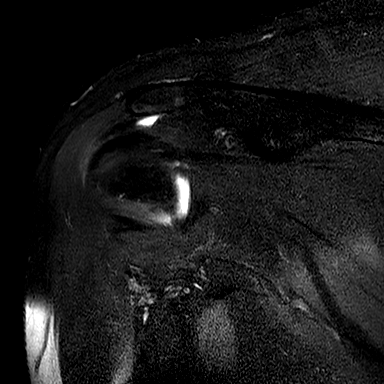
[im 26/26]
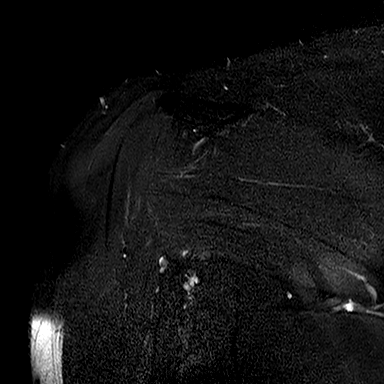

[26 of 40 positions shown; findings below may reference images not displayed]

FINDINGS: ROTATOR CUFF: Again seen is full-thickness tear of the majority of the 
supraspinatus tendon with retraction to the level of the undersurface of the 
acromion. Full-thickness tear measures up to 2.2 cm AP, series [DATE], image 23.. 
There are few dorsally intact fibers at the level of the aponeurotic juncture 
with the infraspinatus tendon. There is full-thickness tear of the mid to 
superior insertion of the subscapularis. There is retraction of approximately 2 
cm, axial image 12. The infraspinatus and teres minor tendons are preserved. The 
rotator cuff musculature is symmetric with symmetric mild fatty infiltration. No 
asymmetric muscular atrophy. 
ACROMIOCLAVICULAR JOINT: Moderate hypertrophic changes of the acromioclavicular 
joint. There is partial tearing of the acromioclavicular ligament. Fluid extends 
through the full-thickness rotator cuff tear into the subacromial-subdeltoid 
space and AC joint. 
GLENOHUMERAL JOINT: Moderate diffuse articular cartilaginous loss of the 
glenohumeral articulation. There is multifocal degenerative tearing/fraying of 
the labrum. Small shoulder joint effusion with mild synovitis/debris. There is 
partial tearing with a thickened appearance of the inferior capsule. The 
intra-articular portion of the long head the biceps tendon is preserved. 
BONES: The bone marrow signal intensity is negative for fracture. No Hill-Sachs 
defect.  
ADDITIONAL FINDINGS: The axillary region is negative. Subcutaneous tissues are 
negative.
IMPRESSION: 1.  Again seen is full-thickness tear of the majority of the supraspinatus 
tendon. 
2.  Interval full-thickness tear of the mid to superior insertion of the 
subscapularis. 
3.  Moderately advanced degenerative changes of the glenohumeral articulation.

## 2021-08-15 ENCOUNTER — Telehealth (HOSPITAL_BASED_OUTPATIENT_CLINIC_OR_DEPARTMENT_OTHER): Payer: Self-pay | Admitting: Internal Medicine

## 2021-08-15 NOTE — Telephone Encounter (Signed)
RETURN CALL: Voicemail - Detailed Message      SUBJECT:  Results     MESSAGE: Patient is part of [employer] program and his nurse case manager is requesting a copy of his most recent A1C test results to be faxed to her at [phone].

## 2021-08-15 NOTE — Telephone Encounter (Signed)
RN called Shukri, CM regarding message received (see message below).    RN confirmed patient's information and provided verbal HA1C result, 6.1 from 05/20/2021. Shukri read back and confirmed result.    Rulon Eisenmenger, Misti Rose routed conversation to [employer] Staff Pool 12 minutes ago (1:31 PM)     Rulon Eisenmenger, Misti Rose 12 minutes ago (1:31 PM)     [employer]    RETURN CALL: Voicemail - Detailed Message      SUBJECT:  Results     MESSAGE: Patient is part of [employer] program and his nurse case manager is requesting a copy of his most recent A1C test results to be faxed to her at [phone].                    Note         Shukri [phone]  Rudi Coco Rose 15 minutes ago (1:27 PM)     [employer]

## 2021-08-19 ENCOUNTER — Encounter (HOSPITAL_COMMUNITY): Payer: Medicare HMO

## 2021-08-21 ENCOUNTER — Ambulatory Visit: Payer: Medicare HMO | Attending: Internal Medicine | Admitting: Registered"

## 2021-08-21 VITALS — Ht 65.0 in | Wt 245.5 lb

## 2021-08-21 DIAGNOSIS — Z713 Dietary counseling and surveillance: Secondary | ICD-10-CM | POA: Insufficient documentation

## 2021-08-21 DIAGNOSIS — E119 Type 2 diabetes mellitus without complications: Secondary | ICD-10-CM | POA: Insufficient documentation

## 2021-08-21 NOTE — Progress Notes (Signed)
Nutrition Note    Clinic:  Endocrinology    Note Type: Followup    Reason For Visit: Diabetes management   Referral Dr Franchot Erichsen 11/28/20    An interpreter was not needed for the visit.    Assessment    Patient Active Problem List   Diagnosis    Obesity, unspecified    Unspecified asthma, with status asthmaticus    Generalized osteoarthrosis, unspecified site    Other hyperlipidemia    Presbyopia    Gout    Irregular heart beat    Achilles tendinosis of right lower extremity    Mild intermittent asthma without complication    Healthcare maintenance    Non-seasonal allergic rhinitis    Subacute massive pulmonary embolism    Type 2 diabetes mellitus with hyperglycemia, with long-term current use of insulin (HCC)    Syncope    PSA elevation     Weight History:    Weight (lbs) BMI (Calculated)   08/21/2021 245 lb 8 oz  40.94       Adult BMI Classification: obese categ. III (>40)    Last 2RD visits   Weight (lbs)   06/19/2021 247 lb 3.2 oz       05/08/21 247 lb 12.8 oz       Initial RD visit  Vitals Weight (lbs)   11/28/2020 248 lb     Labs:    A1C   Latest Ref Rng 4.0 - 6.0 %   11/04/2020 17.5 (H)    05/20/2021 6.1 (H)       Self monitored blood glucose: checking 2x/d before breakfast and before dinner.    08/21/21: Avg 101mg /dL, Fasting 73- 156mg /dL (avg 109mg /dL), before dinner 58- 137mg /dL (avg 94mg /dL)    Previous  06/19/21: Avg 110mg /dL, Fasting 76- 174mg /dL; before dinner 49- 110mg /dL  10/6- 05/08/21: Avg 113mg /dL,Fasting: 97- 163mg /dL (avg 126mg /dL); Before dinner: 70- 137mg /dL (avg 104mg ./dL)  03/27/21: Avg 112mg /dL, Fasting 86- 184mg /dL; Before dinner: 75- 140mg /dL  7/7-7/20/22: Avg 103mg /dL, Fasting 73- 188mg /dL (avg 145mg /dL; Before dinner: 67- 105mg /dL (avg 77mg /dL)    Medications include:  70/30  40 units am and 20 units pm (suggested decreasing to 35 units w/ breakfast if before dinner readings less than 150mg /dL) pt has been taking 35 in the morning if his morning reading is less than 150 instead of  decreasing this dose if readings before dinner are lower (opposite of what was recommended)    Vitamins/Minerals/Herbal Supplements: started using apple cider vinegar 1tsp every other day (friend told him it could help w/ weight and diabetes)    Barriers to adequate intake: none    GI tolerance: None    Activity:  Most days goes fishing, some walking involved    Food intolerances/allergies: None reported    Diet History:   Pt reports consuming 2 meals/d: more sandwiches,     Previous  B:  raisin bran with unsweetened almond milk or oatmeal (needs to get more stevia before he can have this); has not been eating eggs w/ sausage and toast, considering eating again  L: none  D: 4-5p eats boiled chicken/turkey hotdog/fish,veg/ salad;  Red beans and rice, sandwiches  Evening snack: no longer buying sugar-free pie "I learned from you that the filling was not the issue but the crust"; Catrell has been snacking more on oreos, trail mix, and hostess pies in order to prevent low readings    Previous  Bev: water throughout the day, Carbmaster brand chocolate milk, apple juice had previously said he  only drank when low but sounds like may be drinking more often ; asks about Brisk Iced Tea and Lemonade  Likes watermelon but trying to avoid  Likes pie tries to find sugar-free blueberry or cherry pie    Information from Patient:   Jeno misunderstood instructions for insulin at our last visit and decreased morning dose to 35 units if his glucose was less 150mg /dL before breakfast vs less than 150mg /dL before dinner.     Previous  -Learning more about nutrition management helpful- better in small chunks of information  -Would like to know what he needs to do to not need insulin  -Pt had many questions about individual foods based on videos he'd seen on YouTube (e.g. sourdough, gluten, olive oil, tomatoes).     Assessment Summary: 71 year old male w/history of asthma, mild CKD, gouthere.  Patient was hospitalized 4/14 - 11/05/20 for  syncope, PE, NSTEMI, Hyoxic resp failure, HHS,  DM diagnosed 10/2020,  AKI  .    Nutrition Diagnosis: Food and nutrition related knowledge deficit evidenced by pt questions and report.  Pt benefits from repeated education regarding carbohydrate managed diet especially concept of carbohydrate vs sugar.   Diabetes management improved based on SMBG, however, having significant lows especially in the eveing so would recommend decreasing insulin doses . He also is treating with sweets which he historically did not include in order to keep glucose levels higher    Previous  Slight weight loss 2lb, pt attributes to cider vinegar stating no other changes.      Interventions and Patient Goals:   Insulin Recommendations  Decease morning dose to 35 units UNLESS your dinner readings are continually higher than 150 at which point you could increase back to 40 units.   Keep evening dose at 20 units      Reviewed hypoglycemia treatment: If glucose is less than 70 eat 3-5 glucose pills or spoonful of jam    Previous  - let him know there is some research suggesting benefits for diabetes and weight management with vinegar. However not conclusive and it will not get rid of diabetes just help to control.  It is not harmful so fine to include if he wishes.  -reviewed carbohydrate managed diet, sources of carbohydrate, plate method  - reviewed label reading  -Reviewed sources of CHO and plate method for balancing meals. Pt voiced understanding of the need to limit but not eliminate CHO.  -Commended pt on increased vegetable intake at meals, switching to stevia for tea, and use of whole grains    Education Provided:  Please refer to Education section of chart     Education materials provided:   Insulin dosage plan     Previous  My Food Plan  Insulin instructions in AVS    Time Spent with Patient: 45 mins    Plan/Recommendations    Follow up: 10/23/21    Plan for next visit: answer questions, review SMBG    Total Time Spent:  75  mins      Ed Blalock, MS, RD, CD, [facility]  Department mobile: [phone]  Email: [email or web address]

## 2021-08-26 ENCOUNTER — Telehealth (HOSPITAL_BASED_OUTPATIENT_CLINIC_OR_DEPARTMENT_OTHER): Payer: Self-pay | Admitting: Internal Medicine

## 2021-08-26 DIAGNOSIS — R7303 Prediabetes: Secondary | ICD-10-CM

## 2021-08-26 DIAGNOSIS — E785 Hyperlipidemia, unspecified: Secondary | ICD-10-CM

## 2021-08-26 DIAGNOSIS — M1A3790 Chronic gout due to renal impairment, unspecified ankle and foot, without tophus (tophi): Secondary | ICD-10-CM

## 2021-08-26 DIAGNOSIS — E1165 Type 2 diabetes mellitus with hyperglycemia: Secondary | ICD-10-CM

## 2021-08-26 DIAGNOSIS — I2699 Other pulmonary embolism without acute cor pulmonale: Secondary | ICD-10-CM

## 2021-08-26 NOTE — Telephone Encounter (Signed)
RETURN CALL: Voicemail - Detailed Message      SUBJECT:  Refill Request    NAME OF MEDICATION(S): Pen Needles and Insulin (HumuLIN)   DATE NEEDED BY: As soon as possible  PRESCRIBING PROVIDER: Aline Brochure  PHARMACY NAME/LOCATION: [facility], [address] [place], [place]  ADDITIONAL INFORMATION:     The patient states he will be needing a refill on Pen needles (17 left) and Insulin - HumuLIN (7 insulin pens left). The patient states he reached out to [facility] for a refill and was advised to reach out as they were unable to reach Dr. Franchot Erichsen.     Please Assist.

## 2021-08-27 ENCOUNTER — Ambulatory Visit: Payer: Medicare HMO

## 2021-08-27 DIAGNOSIS — E1165 Type 2 diabetes mellitus with hyperglycemia: Secondary | ICD-10-CM

## 2021-08-27 MED ORDER — METFORMIN HCL 500 MG OR TABS
500.0000 mg | ORAL_TABLET | Freq: Two times a day (BID) | ORAL | 0 refills | Status: DC
Start: 2021-08-27 — End: 2022-09-22

## 2021-08-27 MED ORDER — TECHLITE PEN NEEDLES 31G X 8 MM MISC
1.0000 | Freq: Two times a day (BID) | 0 refills | Status: DC
Start: 2021-08-27 — End: 2021-10-09

## 2021-08-27 MED ORDER — HUMULIN 70/30 KWIKPEN (70-30) 100 UNIT/ML SC SUPN
PEN_INJECTOR | SUBCUTANEOUS | 0 refills | Status: DC
Start: 2021-08-27 — End: 2021-10-09

## 2021-08-27 MED ORDER — ONETOUCH DELICA PLUS LANCET33G MISC
1.0000 | Freq: Four times a day (QID) | 2 refills | Status: DC
Start: 2021-08-27 — End: 2022-09-22

## 2021-08-27 MED ORDER — APIXABAN 5 MG OR TABS
5.0000 mg | ORAL_TABLET | Freq: Two times a day (BID) | ORAL | 0 refills | Status: DC
Start: 2021-08-27 — End: 2022-01-24

## 2021-08-27 MED ORDER — ALLOPURINOL 100 MG OR TABS
200.0000 mg | ORAL_TABLET | Freq: Every day | ORAL | 0 refills | Status: DC
Start: 2021-08-27 — End: 2022-11-12

## 2021-08-27 MED ORDER — ATORVASTATIN CALCIUM 40 MG OR TABS
ORAL_TABLET | ORAL | 0 refills | Status: DC
Start: 2021-08-27 — End: 2022-11-12

## 2021-08-27 NOTE — Telephone Encounter (Signed)
NV 11/25/21

## 2021-08-27 NOTE — Progress Notes (Signed)
Nurse Care Management Brief Note:    Visit type: Appointed    Location: phone    The language the patient speaks: [place]   Interpreter present: None    Other(s) present:  NO    Summary: Phone follow up with Nathan [name]  Diabetes:  Hemoglobin A1C (%)   Date Value   05/20/2021 6.1 (H)   11/04/2020 17.5 (H)   11/01/2020 17.1 (H)     NPH /Reg mix   35-40 am 20 units pm  Metformin 500 mg bid  eGFR 52   Latest Reference Range & Units 05/20/21 14:44   Albumin (Micro), URN mg/dL 2.05   Albumin/Creatinine Ratio, URN <30 mg/gcreat 7     Eats breakfast and dinner, rarely eats lunch.    C/o GI upset,eating  too many  pepperoni sticks    Snacking on trail mix, nuts, apple juice  Pears    Assessment: Plan   Low health literacy, difficulty with reading text and numbers.  High risk for hypoglycemia on NPH/Reg mix, reviewed precautions, with teach back  BMI 40.9  [name] is interested in trying  weekly GLP1 RA  Call to [facility] aid to determine coverage.  Both trulicity and ozempic are covered.    Discuss with Dr. Franchot Erichsen and coordinate follow up with Nathan [name].     Next Follow up:   Date Visit Type Length Department Provider     09/10/2021 2:00 PM PHONE VISIT 60 min [place] at: [facility], 3rd Floor [phone] CARE MANAGEMENT            10/23/2021 2:15 PM CLINICAL SUPPORT 30 min [facility]   Arrive at: [place] [address]            11/25/2021 3:00 PM RETURN MEDICARE AWV 60 min [facility]   Arrive at:  [facility], 3rd Floor [name], MD

## 2021-09-10 ENCOUNTER — Ambulatory Visit: Payer: Medicare HMO

## 2021-09-10 DIAGNOSIS — E1165 Type 2 diabetes mellitus with hyperglycemia: Secondary | ICD-10-CM

## 2021-09-10 DIAGNOSIS — Z794 Long term (current) use of insulin: Secondary | ICD-10-CM

## 2021-09-10 NOTE — Progress Notes (Signed)
Nurse Care Management Brief Note:    Visit type: Appointed    Location: phone    The language the patient speaks: [place]   Interpreter present: None    Other(s) present:  NO    Summary: Follow up call to [place].  Diabetes: BGM variable on NPH/Reg mix,  BMI 40  Using glucose tablets several times week  Poor recall of numbers, unable to review on monitor.    Hemoglobin A1C (%)   Date Value   05/20/2021 6.1 (H)   11/04/2020 17.5 (H)   11/01/2020 17.1 (H)     BP Readings from Last 3 Encounters:   07/04/21 116/64   11/26/20 111/77   11/06/20 122/78         Assessment: Plan   Low health literacy, difficulty with reading text and numbers. Agrees to write down BG numbers for our next phone visit in 2 weeks.   High risk for hypoglycemia on NPH/Reg mix, reviewed precautions, with teach back  BMI 40.9  Kevan is interested in trying  weekly GLP1 RA  Call to [facility] aid to determine coverage.  Both trulicity and ozempic are covered.    Discuss with Dr. Franchot Erichsen and coordinate follow up with [name].  Can change from NPH/Reg to NPH BID at lower dose when add GLP1  Goal : reduce risk for hypoglycemia and achieve metabolic goals.    Phone follow up 2 weeks to review SMBG.  Next Follow up:    Date Visit Type Length Department Provider    09/24/2021 2:00 PM PHONE VISIT 60 min [place] at: [facility], 3rd Floor [phone] CARE MANAGEMENT            10/23/2021 2:15 PM CLINICAL SUPPORT 30 min [facility]   Arrive at: [place] [address] [place]            11/25/2021 3:00 PM RETURN MEDICARE AWV 60 min [facility]   Arrive at: [facility], 3rd Floor [name], MD

## 2021-09-24 ENCOUNTER — Ambulatory Visit: Payer: Medicare HMO

## 2021-09-24 DIAGNOSIS — E1165 Type 2 diabetes mellitus with hyperglycemia: Secondary | ICD-10-CM

## 2021-09-24 NOTE — Progress Notes (Signed)
Nurse Care Management Brief Note:    Visit type: Appointed    Location: phone    The language the patient speaks: [place] present: None    Other(s) present:  NO    Summary:   Phone follow up with Nathan [name].  Diabetes:  Hemoglobin A1C (%)   Date Value   05/20/2021 6.1 (H)   11/04/2020 17.5 (H)   11/01/2020 17.1 (H)       BGM  Am  124, 94,  131, 89,95, 86, 95, 121, 135, 111, 110, 118  Post meal 202    Pm  93, 88, 85, 67, 91, 121, 119, 107  Hs   154, 108, 202, 89, 85, 195, 76, 75, 85, 76, 145, 94, 126, 157, 181    NPH/Regular mix 35 units am 20 units pm  Metformin 500 mg bid    Assessment: Plan   Diabetes: fasting and prandial  glucose well controlled.  Less hypoglycemia on current insulin dose.  Has glucose tabs on hand.    BMI 40.9  Nathan [name] is interested in trying weekly GLP1 RA  Call to [facility] aid to determine coverage. Both trulicity and ozempic are covered.    Discuss with Dr. Franchot Erichsen and coordinate follow up with Nathan [name].  Can change from NPH/Reg to NPH BID or  Reduce mix insulin by 25%  Goal : reduce risk for hypoglycemia and maintain glycemic control.    Phone follow up 2 weeks to review SMBG.      Next Follow up:   Date Visit Type Length Department Provider     10/23/2021 2:15 PM CLINICAL SUPPORT 30 min [place] [facility]   Arrive at: [place] [address]            11/25/2021 3:00 PM RETURN MEDICARE AWV 60 min [place] at: [facility], 3rd Floor [name], MD

## 2021-09-27 ENCOUNTER — Telehealth (HOSPITAL_BASED_OUTPATIENT_CLINIC_OR_DEPARTMENT_OTHER): Payer: Self-pay | Admitting: Internal Medicine

## 2021-09-27 DIAGNOSIS — Z794 Long term (current) use of insulin: Secondary | ICD-10-CM

## 2021-09-27 DIAGNOSIS — E1165 Type 2 diabetes mellitus with hyperglycemia: Secondary | ICD-10-CM

## 2021-09-27 NOTE — Telephone Encounter (Signed)
RN received call from pt asking for refill for Insulin Pen-Injector and Pen Needles. Pt said he already called [facility] but they don't have any refills, they asked him to contact PCP office.    Note forwarded to Refill Auth Pool.

## 2021-09-27 NOTE — Telephone Encounter (Signed)
[facility]. Refills already on file. No new RX needed.

## 2021-10-09 MED ORDER — TECHLITE PEN NEEDLES 31G X 8 MM MISC
1.0000 | Freq: Two times a day (BID) | 2 refills | Status: DC
Start: 2021-10-09 — End: 2022-09-22

## 2021-10-09 MED ORDER — HUMULIN 70/30 KWIKPEN (70-30) 100 UNIT/ML SC SUPN
PEN_INJECTOR | SUBCUTANEOUS | 2 refills | Status: DC
Start: 2021-10-09 — End: 2022-07-03

## 2021-10-09 NOTE — Telephone Encounter (Signed)
RETURN CALL: Voicemail - Detailed Message      SUBJECT:  General Message     MESSAGE:   Patient is requesting clinic call [facility] as they have not filled medication yet and state as out of stock.     Please check on medications:   insulin NPH & REGULAR (HumuLIN 70/30 KwikPen) (70-30) 100 UNIT/ML pen-injector [[id number]],   pen needle (TechLite Pen Needles) 31G X 8 MM [[id number]]

## 2021-10-09 NOTE — Telephone Encounter (Addendum)
Last seen by PCP 07/04/21. Spoke with patient, states he's been injecting NPH/R 35 units QAM and 20 units QPM. Will refill insulin and pen needles. Next visit w/ 11/25/21.    Lestine Mount, PharmD, [facility]

## 2021-10-09 NOTE — Addendum Note (Signed)
Addended by: Acquanetta Chain on: 10/09/2021 10:14 AM     Modules accepted: Orders

## 2021-10-23 ENCOUNTER — Ambulatory Visit: Payer: Medicare HMO | Attending: Internal Medicine | Admitting: Registered"

## 2021-10-23 DIAGNOSIS — E119 Type 2 diabetes mellitus without complications: Secondary | ICD-10-CM | POA: Insufficient documentation

## 2021-10-23 DIAGNOSIS — Z713 Dietary counseling and surveillance: Secondary | ICD-10-CM | POA: Insufficient documentation

## 2021-10-28 NOTE — Progress Notes (Signed)
Nutrition Note    Clinic:  Endocrinology    Note Type: Followup    Reason For Visit: Diabetes management   Referral Dr Franchot Erichsen 11/28/20    An interpreter was not needed for the visit.    Assessment    Patient Active Problem List   Diagnosis   . Obesity, unspecified   . Unspecified asthma, with status asthmaticus   . Generalized osteoarthrosis, unspecified site   . Other hyperlipidemia   . Presbyopia   . Gout   . Irregular heart beat   . Achilles tendinosis of right lower extremity   . Mild intermittent asthma without complication   . Healthcare maintenance   . Non-seasonal allergic rhinitis   . Subacute massive pulmonary embolism   . Type 2 diabetes mellitus with hyperglycemia, with long-term current use of insulin ([place])   . Syncope   . PSA elevation     Weight History: 10/23/21- not weighed     Weight (lbs) BMI (Calculated)   08/21/2021 245 lb 8 oz  40.94       Adult BMI Classification: obese categ. III (>40)    Last 2RD visits   Weight (lbs)   06/19/2021 247 lb 3.2 oz       05/08/21 247 lb 12.8 oz       Initial RD visit  Vitals Weight (lbs)   11/28/2020 248 lb     Labs:    A1C   Latest Ref Rng 4.0 - 6.0 %   11/04/2020 17.5 (H)    05/20/2021 6.1 (H)       Self monitored blood glucose: checking 2x/d before breakfast and before dinner.    3/23- 10/23/21:  Avg '108mg'$ /dL: Fasting 73- '223mg'$ /dL (avg '119mg'$ /dL)  Before dinner 57- '199mg'$ /dL (avg '101mg'$ /dL )- 2 readings of '57mg'$ /dL    Previous  08/21/21: Avg '101mg'$ /dL, Fasting 73- '156mg'$ /dL (avg '109mg'$ /dL), before dinner 58- '137mg'$ /dL (avg '94mg'$ /dL)  06/19/21: Avg '110mg'$ /dL, Fasting 76- '174mg'$ /dL; before dinner 49- '110mg'$ /dL  10/6- 05/08/21: Avg '113mg'$ /dL,Fasting: 97- '163mg'$ /dL (avg '126mg'$ /dL); Before dinner: 70- '137mg'$ /dL (avg '104mg'$ ./dL)  03/27/21: Avg '112mg'$ /dL, Fasting 86- '184mg'$ /dL; Before dinner: 75- '140mg'$ /dL  7/7-7/20/22: Avg '103mg'$ /dL, Fasting 73- '188mg'$ /dL (avg '145mg'$ /dL; Before dinner: 67- '105mg'$ /dL (avg '77mg'$ /dL)    Medications include:  70/30  40 units am and 20 units pm (suggested decreasing to 35  units w/ breakfast if before dinner readings less than '150mg'$ /dL) pt has been taking 35 in the morning if his morning reading is less than 150 instead of decreasing this dose if readings before dinner are lower (opposite of what was recommended)    Vitamins/Minerals/Herbal Supplements: started using apple cider vinegar 1tsp every other day (friend told him it could help w/ weight and diabetes)    Barriers to adequate intake: none    GI tolerance: None    Activity:  Most days goes fishing, some walking involved    Food intolerances/allergies: None reported    Diet History:   Pt reports consuming 2 meals/d: raisin bran or cheerios for breakfast and more sandwiches later in the day  Snacks: pears, pecans/nuts  Drinking orange juice especially if he feels woozy- feels that it works better than glucose tablets    Previous  B:  raisin bran with unsweetened almond milk or oatmeal (needs to get more stevia before he can have this); has not been eating eggs w/ sausage and toast, considering eating again  L: none  D: 4-5p eats boiled chicken/turkey hotdog/fish,veg/ salad;  Red beans and rice, sandwiches  Evening  snack: no longer buying sugar-free pie "I learned from you that the filling was not the issue but the crust";   Damoni has been snacking more on oreos, trail mix, and hostess pies in order to prevent low readings    Bev: water throughout the day, Carbmaster brand chocolate milk, apple juice had previously said he only drank when low but sounds like may be drinking more often ; asks about Brisk Iced Tea and Lemonade  Likes watermelon but trying to avoid  Likes pie tries to find sugar-free blueberry or cherry pie    Information from Patient: .     Previous  -Learning more about nutrition management helpful- better in small chunks of information  -Would like to know what he needs to do to not need insulin  -Pt had many questions about individual foods based on videos he'd seen on YouTube (e.g. sourdough, gluten, olive oil,  tomatoes).     Assessment Summary: 71 year old male w/history of asthma, mild CKD, gouthere.  Patient was hospitalized 4/14 - 11/05/20 for syncope, PE, NSTEMI, Hyoxic resp failure, HHS,  DM diagnosed 10/2020,  AKI  .    Nutrition Diagnosis: Food and nutrition related knowledge deficit evidenced by pt questions and report.  Pt benefits from repeated education regarding carbohydrate managed diet especially concept of carbohydrate vs sugar.   Diabetes management improved based on SMBG, however, still some lows especially in the eveing possibly overtreating w/ oj.      Interventions and Patient Goals:   Reviewed hypoglycemia treatment: If glucose is less than 70 ok to drink juice, should only need ~4oz    Education Provided:  Please refer to Education section of chart     Education materials provided:   Previous  Insulin dosage plan   My Food Plan  Insulin instructions in AVS    Time Spent with Patient: 45 mins    Plan/Recommendations    Follow up: prn    Plan for next visit: answer questions, review SMBG    Total Time Spent:  45 mins      Ed Blalock, MS, RD, CD, [facility]  Department mobile: [phone]. [phone]  Email: [email or web address]

## 2021-11-25 ENCOUNTER — Encounter (HOSPITAL_BASED_OUTPATIENT_CLINIC_OR_DEPARTMENT_OTHER): Payer: Medicare HMO | Admitting: Internal Medicine

## 2021-11-25 ENCOUNTER — Other Ambulatory Visit (HOSPITAL_BASED_OUTPATIENT_CLINIC_OR_DEPARTMENT_OTHER): Payer: Self-pay | Admitting: Internal Medicine

## 2021-11-25 ENCOUNTER — Ambulatory Visit: Payer: Medicare HMO | Attending: Internal Medicine | Admitting: Internal Medicine

## 2021-11-25 VITALS — BP 138/88 | HR 59 | Temp 96.8°F | Wt 246.0 lb

## 2021-11-25 DIAGNOSIS — E1165 Type 2 diabetes mellitus with hyperglycemia: Secondary | ICD-10-CM

## 2021-11-25 DIAGNOSIS — Z794 Long term (current) use of insulin: Secondary | ICD-10-CM | POA: Insufficient documentation

## 2021-11-25 DIAGNOSIS — Z0000 Encounter for general adult medical examination without abnormal findings: Secondary | ICD-10-CM | POA: Insufficient documentation

## 2021-11-25 DIAGNOSIS — I2699 Other pulmonary embolism without acute cor pulmonale: Secondary | ICD-10-CM | POA: Insufficient documentation

## 2021-11-25 DIAGNOSIS — R972 Elevated prostate specific antigen [PSA]: Secondary | ICD-10-CM | POA: Insufficient documentation

## 2021-11-25 DIAGNOSIS — J4520 Mild intermittent asthma, uncomplicated: Secondary | ICD-10-CM | POA: Insufficient documentation

## 2021-11-25 LAB — COMPREHENSIVE METABOLIC PANEL
ALT (GPT): 19 U/L (ref 10–48)
AST (GOT): 18 U/L (ref 9–38)
Albumin: 4.1 g/dL (ref 3.5–5.2)
Alkaline Phosphatase (Total): 94 U/L (ref 36–161)
Anion Gap: 7 (ref 4–12)
Bilirubin (Total): 0.6 mg/dL (ref 0.2–1.3)
Calcium: 9.3 mg/dL (ref 8.9–10.2)
Carbon Dioxide, Total: 26 meq/L (ref 22–32)
Chloride: 107 meq/L (ref 98–108)
Creatinine: 1.56 mg/dL — ABNORMAL HIGH (ref 0.51–1.18)
Glucose: 110 mg/dL (ref 62–125)
Potassium: 4.7 meq/L (ref 3.6–5.2)
Protein (Total): 7.5 g/dL (ref 6.0–8.2)
Sodium: 140 meq/L (ref 135–145)
Urea Nitrogen: 27 mg/dL — ABNORMAL HIGH (ref 8–21)
eGFR by CKD-EPI 2021: 48 mL/min/{1.73_m2} — ABNORMAL LOW (ref >59)

## 2021-11-25 LAB — ALBUMIN/CREATININE RATIO, RANDOM URINE
Albumin (Micro), URN: 0.88 mg/dL
Albumin/Creatinine Ratio, URN: 7 mg/g{creat} (ref <30)
Creatinine/Unit, URN: 121 mg/dL

## 2021-11-25 LAB — PSA, SCREENING: PSA, Screening: 23.06 ng/mL — ABNORMAL HIGH (ref 0.00–4.00)

## 2021-11-25 MED ORDER — ONETOUCH VERIO FLEX SYSTEM W/DEVICE KIT
PACK | Freq: Four times a day (QID) | 6 refills | Status: AC
Start: 2021-11-25 — End: ?

## 2021-11-25 NOTE — Progress Notes (Addendum)
[facility] - Outpatient Return Clinic Note    Date:  11/25/2021    Nathan [name] is a 71 year old male who is here today for   Chief Complaint   Patient presents with   . Wellness     INTERVAL HISTORY/HPI:    [name] is a 71 year old [place] hospitalized 4/14 - 11/05/20 for syncope, PE, NSTEMI, Hyoxic resp failure, HHS, Newly diagnosed DM, Asthma, AKI/CKD, and gout.     Nathan [name] returns to clinic for ongoing follow-up.  Nathan [name] is here today for a wellness survey.    Nathan [name] indicates that his lowest home blood sugar level has been 72 according to his recollection.   Nathan [name] reports using occasional glucose tablets, but very infrequently and none most recently.    Wellness survey: 11/25/21 summary  Overall health has been "excellent"  No stress, pain or anger.    Nutrition is "good".    Oral health is "fair".  Fire Ext and Smoke alarm Yes.   Household activities are Yes.    No falls  Not worried about falls.   No incontinence.        Chart Review:    09/24/21:  DM note:  Assessment: Plan   Diabetes: fasting and prandial  glucose well controlled.  Less hypoglycemia on current insulin dose.  Has glucose tabs on hand.  BMI 40.9  Nathan [name] is interested in trying weekly GLP1 RA  Call to [facility] aid to determine coverage. Both trulicity and ozempic are covered.  Discuss with Dr. Franchot Erichsen and coordinate follow up with [name].  Can change from NPH/Reg to NPH BID or  Reduce mix insulin by 25%  Goal : reduce risk for hypoglycemia and maintain glycemic control.  Phone follow up 2 weeks to review SMBG."      10/09/21: Pharm:   Last seen by PCP 07/04/21. Spoke with patient, states Nathan [name]'s been injecting NPH/R 35 units QAM and 20 units QPM. Will refill insulin and pen needles. Next visit w/ PCP 11/25/21."      Allergies: Grass and cherry blossoms. Nathan [name] reports they have been kicking in late November. Not allergic to ASA    SH: Nathan [name] has been staying at home. Nathan [name] lives at home alone, likes to fish in the warmer months,Nathan [name] states  hebelieves in [place], and lives on a fixed income. Nathan [name] likes to "speak whenspoken to".      02/19/21:  Ophthal:  "IMPRESSION/PLAN:  1. DM2without retinopathy  - last A1c17.5 (although this was during an episode of hyperglycemichyperosmolar syndrome)  - discussed importance of good blood glucose control  - recommend annual DFE (next due 02/2022)  2. Mixed type cataracts, both eyes  - mild visual significance left eye  - monitor  3.Presbyopia  - Discussed with patient the need for reading glasses as Nathan [name] ages. Nathan [name] understands and is happy to try reading glasses  - recommend +2.00 to +2.50 OTC readers"    PROBLEM LIST: See Epic Problem List    MEDICATIONS:    Current Outpatient Medications:   .  Albuterol Sulfate HFA 108 (90 Base) MCG/ACT Inhalation Aero Soln, Inhale 2 puffs by mouth every 6 hours as needed for shortness of breath/wheezing., Disp: 1 Inhaler, Rfl: 6  .  allopurinol 100 MG tablet, Take 2 tablets (200 mg) by mouth daily., Disp: 180 tablet, Rfl: 0  .  apixaban 5 MG tablet, Take 1 tablet (5 mg) by mouth 2 times a day., Disp: 180 tablet, Rfl: 0  .  atorvastatin 40 MG tablet, Take 1 tablet (40 mg) by mouth every evening. To lower cholesterol., Disp: 90 tablet, Rfl: 0  .  glucometer (OneTouch Verio Flex System) w/Device kit, Use to check blood sugar 4 times a day., Disp: 1 kit, Rfl: 0  .  blood glucose test strip, Use 1 strip 4 times a day. Use to check blood sugar., Disp: 400 strip, Rfl: 4  .  colchicine 0.6 MG tablet, Take 2 tablets by mouth at first sign of flare followed by 1 table 1 hour later, Disp: 30 tablet, Rfl: 0  .  Fluticasone Propionate HFA 110 MCG/ACT Inhalation Aerosol, Inhale 1 puff by mouth every 12 hours., Disp: 1 Inhaler, Rfl: 6  .  glucose 4 g chewable tablet, Chew and swallow 4 tablets (16 g) by mouth every 15 minutes as needed for low blood sugar. Recheck blood sugar as needed., Disp: 50 tablet, Rfl: 3  .  insulin NPH & REGULAR (HumuLIN 70/30 KwikPen) (70-30) 100 UNIT/ML  pen-injector, Inject 35 units under the skin every morning AND 20 units every evening., Disp: 75 mL, Rfl: 2  .  ketoconazole 2 % shampoo, apply topically face and scalp if needed, Disp: 120 mL, Rfl: 1  .  lancet (OneTouch Delica Plus) 32R, Use 1 to check blood glucose 4 times a day., Disp: 400 each, Rfl: 2  .  loratadine 10 MG tablet, Take 1 tablet (10 mg) by mouth daily as needed for allergies. (past due for annual exam/follow up appointment), Disp: 30 tablet, Rfl: 2  .  metFORMIN 500 MG tablet, Take 1 tablet (500 mg) by mouth 2 times a day with meals., Disp: 180 tablet, Rfl: 0  .  pen needle (TechLite Pen Needles) 31G X 8 MM, Use to inject insulin 2 times a day., Disp: 200 each, Rfl: 2  .  Triamcinolone Acetonide 0.1 % External Ointment, Apply to affected area on neck 2 times a day., Disp: 15 g, Rfl: 0    Called [facility] during visit to refill a Glucometer.    Nathan [name] confirms taking Humulin KwikPen (70-30) 35 units q am and 20 units q pm.    Metformin 500 mg bid confirmed  Apixaban 5 mg bid confirmed    ALL: NKA  GFR/Cr:  48/1.6  QTc (> 450 men/470 women): 459 (11/01/20)       ALLERGIES:  Review of patient's allergies indicates:  No Known Allergies    PHYSICAL EXAM:    BP 138/88   Pulse (!) 59   Temp 36 C (Temporal)   Wt (!) 111.6 kg (246 lb)   SpO2 93%   BMI 40.94 kg/m     NAD  Lungs good airflow with mild exp wheezing  (able to walk as far as Nathan [name] wants on level)    COR RRR w/o M  Ext No Edema    Foot exam:  (11/25/21)   - Visual inspection:  normal to inspection, with intact skin, no significant callus formation, no evidence of ischemia.    - Monofilament exam:  Intact    - Pulse exam: DP's symmetric.       LAB AND IMAGING RESULTS:  Orders Only on 07/10/21   1. Occult Blood by Immunoassay, Stool   Result Value Ref Range    Occult Bld 1 Result Negative NRN     08/06/21: TTE:  Conclusion:   Normal LV size with mildly reduced systolic function. Regional wall motion  abnormalities shown below.  Normal RV size and  systolic  function. Normal RA pressure. Could not evaluate  PA pressure. RV/LV ratio 0.7 which is normal.  Normal valve structure and function.  Compared with prior study from 4/14//22, RV size and systolic function has  improved.    ASSESSMENT AND PLAN:    1.Diabetes:  Humulin 70/30 and Metformin  Foot Exam- 11/26/20, 07/04/21, 11/25/21 completed.   Ophthal Exam:  02/19/21:  DM2without retinopathy - recommend annual DFE.  Mixed type cataracts, both eyes, mild. Monitor. Referrral for annual eye exam placed for August 2023.       A1C UWM AMB    A1C   Latest Ref Rng 4.0 - 6.0 %   12/26/2019 7.5 (H)    11/01/2020 17.1 (H)    11/04/2020 17.5 (H)    05/20/2021 6.1 (H)      2.Asthma. Nathan [name] uses -Fluticasone 110 mcg BID -Albuterol Q6hr PRNand has a stable pattern. Nathan [name] reports that Nathan [name] has never called 911 or been hospitalized for Asthma. No documentation of PFTs at [facility] or Care Everywhere. PEF on exam today 300 lpm, no wheezing on exam. Will refill meds and refer for PFTs.(PFTs not yet obtained)    3. Cardiac Risk Factors. BP 124/62 off meds, BMI 47, LDL 96, DM no, No FH CAD, No tobacco. No MI or Stroke.   EKG (11/01/21): NSR, Axis - 52 (LAD), PR N, QRS 168, QTc 459, RBBB, LAHB, No Q-waves.  ASCVD Risk 9.8% Atorvastatin 40 MG.ASA has been stopped.  Repeat lipids.       Cholesterol    LDL Cholesterol   Latest Ref Rng <130 mg/dL   11/07/2015 96    07/11/2019 89    11/01/2020 107    05/20/2021 62      4. CKD. Stable pattern over time.   Creatinine 1.60 - 1.45 mg/dL ()  GFR 44 - 52 ml/min (05/20/21)  Prot / Creat <0.1 (02/22/16)   Alb/Creat 7 (05/20/21)    5. Chronic gout. Nathan [name] report intermittent foot pain but is non-specific on interview. Records indicate last flare podagra (L great toe) in June 2017. Nathan [name] is minimizing red meat, and no alcohol. Nathan [name] has CKD therefore goal is to use tylenol over NSAIDs for pain. Nathan [name] is on Allopurinol 200 mg daily, Colchicine 0.6 mg 2 tabs for flare, then 1 tab 1 hr later. Uric  Acid 7.4 (07/07/18). CrCl at 50 mL/min, allopurinol and colchicine doses acceptable.ASA has been stopped due to gout and CKD.(stable 11/26/20)    6. Tinea Capitus.Ketoconazole shampoo    7.  Social:  Nathan [name] has no DPOA.  But his brother Elta Guadeloupe who lives in [place] calls and checks on him the most.  Nathan [name] would want Elta Guadeloupe involved if her were unable to make decisions for himself.        HEALTH CARE MAINTANENCE / SCREENING:(See below)    PHQ 2 Screening:p    Colorectal Cancer Screening: FIT negative (07/23/18 and 07/26/19).  Patient declines colonoscopy.  2017 - 07/10/21: FIT Negative x 5.    Tobacco XNA:TFTD at age 59.  ETOH Use:Occ use  Marijuana:Occ    CT SCAN / Low Dose Lung CT (Age 51-80, >30 pyrs and quit < 15 yrs ago):  NA    Hepatitis C / HIV Screening:  HIV negative 2013.  Hep C (1945-65 + high risk):Neg 2013    Prostate Cancer Screening:  No FH of prostate CA.   Nathan [name] elects to obtain a PSA after SDM.  (07/04/21)  PSA 28.19 (referral sent on 07/10/21, patients questions  were addressed but closed.)  11/25/21:  Again SDM today with patient agreement to proceed with referral to Urology.  Referral placed. (11/25/21).     AAA Screening (Men 2-75, Any Tob Hx, one time): NA    Nathan [name] declines a flu vaccine. Bivalent COVID booster. (07/04/21).       Immunization History   Administered Date(s) Administered   . COVID-19 Moderna mRNA monovalent 12 yrs and older 09/20/2019, 10/18/2019   . [place] mRNA bivalent 12 yrs and older 07/04/2021   . COVID-19 Pfizer mRNA monovalent tris-sucrose 12 yrs and older (gray cap) 11/26/2020   . Hepatitis B, unspecified 10/05/2007, 11/05/2007, 05/08/2008   . Influenza quadrivalent PF 04/19/2013, 06/12/2014, 05/01/2015   . Influenza quadrivalent adjuvanted (Fluad) 05/19/2019   . Influenza trivalent PF 07/07/2012   . Influenza, unspecified 09/26/2010   . Pneumococcal polysaccharide PPSV23 (Pneumovax 23) 02/12/2009   . Tdap 01/21/2011     Future Appointments   Date Time  Provider [place]   11/25/2021 12:00 PM [name], MD [facility] AM     F/U Dr. Franchot Erichsen in 4 months.     Total time spent in the direct care of this patient and in reviewing records and documentation on the date of service was 50  minutes.

## 2021-11-25 NOTE — Patient Instructions (Addendum)
Today on your annual wellness evaluation, we reviewed your intake form.      Overall you are doing well.  You indicated that your brother Elta Guadeloupe would be your decision maker if you were unable to make decisions.      For your diabetes, I refilled your Glucometer.      You next eye visit will be in August 2023.  Please obtain blood work and urine today for diabetes checks.      For your elevated PSA, today we discussed this and you are interested in proceeding with the Urology visit.  I placed a referral and asked you to retest your PSA.    I will see you in again in 3 - 4 months.

## 2021-11-26 LAB — HEMOGLOBIN A1C, HPLC: Hemoglobin A1C: 6.1 % — ABNORMAL HIGH (ref 4.0–6.0)

## 2021-11-28 DIAGNOSIS — Z0000 Encounter for general adult medical examination without abnormal findings: Secondary | ICD-10-CM | POA: Insufficient documentation

## 2021-11-28 NOTE — Progress Notes (Signed)
Labs Reviewed:    Microalb 7 (WNL)  A1c 6.1%  (6/1% 05/20/21)  - stable at goal  Creat 1.6 (1.6 11/02/20) - stable   GFR approx 50 stable.   PSA 28-23.   (Have referred patient to GU after SDM and his concurrence.)        Orders Only on 11/25/21   1. Albumin/Creatinine Ratio, Random Urine   Result Value Ref Range    Creatinine/Unit, URN 121 mg/dL    Albumin (Micro), URN 0.88 mg/dL    Albumin/Creatinine Ratio, URN 7 <30 mg/g[creat]   2. PSA, Screening   Result Value Ref Range    PSA, Screening 23.06 (H) 0.00 - 4.00 ng/mL   3. Comprehensive Metabolic Panel   Result Value Ref Range    Sodium 140 135 - 145 meq/L    Potassium 4.7 3.6 - 5.2 meq/L    Chloride 107 98 - 108 meq/L    Carbon Dioxide, Total 26 22 - 32 meq/L    Anion Gap 7 4 - 12    Glucose 110 62 - 125 mg/dL    Urea Nitrogen 27 (H) 8 - 21 mg/dL    Creatinine 1.56 (H) 0.51 - 1.18 mg/dL    Protein (Total) 7.5 6.0 - 8.2 g/dL    Albumin 4.1 3.5 - 5.2 g/dL    Bilirubin (Total) 0.6 0.2 - 1.3 mg/dL    Calcium 9.3 8.9 - 10.2 mg/dL    AST (GOT) 18 9 - 38 U/L    Alkaline Phosphatase (Total) 94 36 - 161 U/L    ALT (GPT) 19 10 - 48 U/L    eGFR by CKD-EPI 2021 48 (L) >59 mL/min/1.73_m2   4. Hemoglobin A1C, HPLC   Result Value Ref Range    Hemoglobin A1C 6.1 (H) 4.0 - 6.0 %      Latest Reference Range & Units Most Recent 07/08/21 14:22   Prostate Specific Antigen 0.00 - 4.00 ng/mL 23.06 (H)  11/25/21 14:01 28.19 (H)   (H): Data is abnormally high     Latest Ref Rng 11/04/2020 05/20/2021 11/25/2021   A1C UWM AMB       A1C 4.0 - 6.0 % 17.5 (H)  6.1 (H)  6.1 (H)       (H) High

## 2021-12-19 ENCOUNTER — Telehealth (HOSPITAL_BASED_OUTPATIENT_CLINIC_OR_DEPARTMENT_OTHER): Payer: Self-pay

## 2021-12-19 NOTE — Telephone Encounter (Signed)
RETURN CALL: Voicemail - Detailed Message      SUBJECT:  General Message     MESSAGE: patient is scheduled for next available 9/26 which is outside the 4-6 weeks timeframe noted in the referral

## 2021-12-25 NOTE — Telephone Encounter (Signed)
I can do 10:30 on Thursday, 6/22.    Thanks!  Doroteo Bradford

## 2022-01-09 ENCOUNTER — Telehealth (HOSPITAL_BASED_OUTPATIENT_CLINIC_OR_DEPARTMENT_OTHER): Payer: Self-pay | Admitting: Nurse Practitioner

## 2022-01-09 ENCOUNTER — Ambulatory Visit: Payer: Medicare HMO | Attending: Nurse Practitioner | Admitting: Nurse Practitioner

## 2022-01-09 ENCOUNTER — Other Ambulatory Visit (HOSPITAL_BASED_OUTPATIENT_CLINIC_OR_DEPARTMENT_OTHER): Payer: Self-pay | Admitting: Nurse Practitioner

## 2022-01-09 ENCOUNTER — Encounter (HOSPITAL_BASED_OUTPATIENT_CLINIC_OR_DEPARTMENT_OTHER): Payer: Self-pay | Admitting: Nurse Practitioner

## 2022-01-09 VITALS — BP 122/87 | HR 63 | Temp 96.6°F | Resp 18 | Ht 65.0 in | Wt 243.0 lb

## 2022-01-09 DIAGNOSIS — R972 Elevated prostate specific antigen [PSA]: Secondary | ICD-10-CM

## 2022-01-09 DIAGNOSIS — E1165 Type 2 diabetes mellitus with hyperglycemia: Secondary | ICD-10-CM | POA: Insufficient documentation

## 2022-01-09 DIAGNOSIS — Z794 Long term (current) use of insulin: Secondary | ICD-10-CM | POA: Insufficient documentation

## 2022-01-09 DIAGNOSIS — Z01818 Encounter for other preprocedural examination: Secondary | ICD-10-CM | POA: Insufficient documentation

## 2022-01-09 LAB — PSA, TOTAL & REFLEXIVE FREE: PSA, Diagnostic/Monitoring: 28.33 ng/mL — ABNORMAL HIGH (ref 0.00–4.00)

## 2022-01-09 NOTE — Progress Notes (Signed)
Site: right AC  What was drawn: sst  Number of attempts: 1  Adverse reaction(s): none

## 2022-01-09 NOTE — Progress Notes (Signed)
[facility] UROLOGY CLINIC EVALUATION    CHIEF COMPLAINT:  Elevated PSA    HISTORY OF PRESENT ILLNESS:  Nathan [name] is a 70 year old male with T2DM, asthma, CKD, gout and elevated PSA.     He denies bother with urination - no slow stream, hesitancy, nocturia, urgency/frequency, gross hematuria, dysuria or flank pain.  Denies constipation or bowel changes in the past few years.  Did have the stomach flue for two weeks, which has now resolved.      PSA Chronology:  28.19 - 07/08/2021  23.06 - 11/25/2021    Smoked 1 PPD from [age] years old.  Is not aware of family history of prostate or other GU malignancy.  No new bone pain.  No exposure to chemicals.  No unintentional weight loss.     Does not want to think negatively and consider that his high PSA may be an indication of prostate cancer.     ALLERGIES:   Allergies as of 01/09/2022    (No Known Allergies)     MEDICATIONS:    Outpatient Medications Marked as Taking for the 01/09/22 encounter (Office Visit) with Orban, Erika M, ARNP   Medication Sig Dispense Refill    Albuterol Sulfate HFA 108 (90 Base) MCG/ACT Inhalation Aero Soln Inhale 2 puffs by mouth every 6 hours as needed for shortness of breath/wheezing. 1 Inhaler 6    allopurinol 100 MG tablet Take 2 tablets (200 mg) by mouth daily. 180 tablet 0    apixaban 5 MG tablet Take 1 tablet (5 mg) by mouth 2 times a day. 180 tablet 0    atorvastatin 40 MG tablet Take 1 tablet (40 mg) by mouth every evening. To lower cholesterol. 90 tablet 0    blood glucose test strip Use 1 strip 4 times a day. Use to check blood sugar. 400 strip 4    colchicine 0.6 MG tablet Take 2 tablets by mouth at first sign of flare followed by 1 table 1 hour later 30 tablet 0    Fluticasone Propionate HFA 110 MCG/ACT Inhalation Aerosol Inhale 1 puff by mouth every 12 hours. 1 Inhaler 6    glucometer (OneTouch Verio Flex System) w/Device kit Use to check blood sugar 4 times a day. 1 kit 6    glucose 4 g chewable tablet Chew and swallow 4 tablets (16  g) by mouth every 15 minutes as needed for low blood sugar. Recheck blood sugar as needed. 50 tablet 3    insulin NPH & REGULAR (HumuLIN 70/30 KwikPen) (70-30) 100 UNIT/ML pen-injector Inject 35 units under the skin every morning AND 20 units every evening. 75 mL 2    ketoconazole 2 % shampoo apply topically face and scalp if needed 120 mL 1    lancet (OneTouch Delica Plus) 33g Use 1 to check blood glucose 4 times a day. 400 each 2    loratadine 10 MG tablet Take 1 tablet (10 mg) by mouth daily as needed for allergies. (past due for annual exam/follow up appointment) 30 tablet 2    metFORMIN 500 MG tablet Take 1 tablet (500 mg) by mouth 2 times a day with meals. 180 tablet 0    pen needle (TechLite Pen Needles) 31G X 8 MM Use to inject insulin 2 times a day. 200 each 2    Triamcinolone Acetonide 0.1 % External Ointment Apply to affected area on neck 2 times a day. 15 g 0     PAST MEDICAL HISTORY:    Past   Medical History:   Diagnosis Date    Obesity, unspecified 11/24/2008    Unspecified asthma, with status asthmaticus 11/24/2008    Generalized osteoarthrosis, unspecified site 11/24/2008    Other and unspecified hyperlipidemia 11/24/2008    Urinary complications 11/24/2008     Patient Active Problem List   Diagnosis    Obesity, unspecified    Generalized osteoarthrosis, unspecified site    Other hyperlipidemia    Presbyopia    Gout    Irregular heart beat    Achilles tendinosis of right lower extremity    Mild intermittent asthma without complication    Healthcare maintenance    Non-seasonal allergic rhinitis    Subacute massive pulmonary embolism    Type 2 diabetes mellitus with hyperglycemia, with long-term current use of insulin (HCC)    Syncope    PSA elevation    Wellness examination     PAST SURGICAL HISTORY:    Past Surgical History:   Procedure Laterality Date    PR UNLISTED PROCEDURE LEG/ANKLE       SOCIAL & FAMILY HISTORY:  He  reports that he quit smoking about 50 years ago. He started smoking about 60 years ago. He  has never used smokeless tobacco. He reports that he does not drink alcohol and does not use drugs.  Social History     Social History Narrative    Originally from [place]. Lives by himself in apartment with [address]. Has brother in [place] and one in [place]. 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 Likes to go fishing, reads the bible.         Tobacco: smoked age [age] approx 1 pack a week. Stopped now.     Alcohol: Denies. Remote drinking in last teens     Substances: Denies      family history includes No Known Problems in his father and mother. There is no history of Blindness, Cataracts, Glaucoma, Macular Degeneration, or Retinal Detachment.    PHYSICAL EXAM:  BP 122/87   Pulse 63   Temp (!) 35.9 C (Temporal)   Resp 18   Ht 5' 5" (1.651 m)   Wt (!) 110.2 kg (243 lb)   BMI 40.44 kg/m   Physical Exam  Constitutional:       Appearance: Normal appearance.   Pulmonary:      Effort: Pulmonary effort is normal.   Abdominal:      Palpations: Abdomen is soft.   Genitourinary:     Comments: Declined today.   Musculoskeletal:         General: Normal range of motion.   Skin:     General: Skin is warm.   Neurological:      Mental Status: He is alert and oriented to person, place, and time.   Psychiatric:         Behavior: Behavior normal.     Non-invasive uroflow Procedure:  To evaluate Nathan [name] complaint of elevated PSA, we performed complex uroflowmetry and post-void residual assessment by bladder scan.    Volume: 75 mL  Flow time: 6 sec  Peak flow 5 ml/sec  Mean flow 5 ml/s  Pattern: low volume void, not diagnostic  Post-void residual 25 mL    LABS:  Creatinine: 1.54 - 11/25/2021    PSA Chronology:  28.19 - 07/08/2021  23.06 - 11/25/2021    IMPRESSION:  T2DM - now well controlled    Elevated PSA - concern for prostate cancer    ASSESSMENT/PLAN:   Nathan [name] is a 70 year old male with the above history.    We  discussed his presentation at length.     It is reassuring he empties his bladder well with no bothersome LUTS.   As for his prostate cancer risk, Mr. Cielo states he wants to "think positive" and believe he does not have prostate cancer.  I validated his desire to remain positive and provided him information on prostate cancer workup and benefits of finding out if cancer is present at earlier stages, especially with his consistently elevated PSA.      He declined DRE today but is agreeable to redrawing PSA.  We reviewed PCPT Risk Calculator with his data:     - 50% high grade  - 18% low grade  - 32% negative    We discussed PNBx procedure at length and should he decide to proceed he would like to have it done under anesthesia.       We agreed that I would call him when PSA results and we will discuss next steps.    My plan is to schedule him with Dr. Hagedorn to discuss undergoing PNBx in OR, as per above, he has a >68% chance of PNBx pathology indicating prostate cancer.     Plan summary:  - F/PVR today, good bladder emptying, no concern for LUTS or retention.  - Redraw PSA.  If continues to be elevated, >10, will schedule with Dr. Hagedorn to discuss PNBx and physical exam, which he declined today.   - Will call when PSA results to discuss the above steps.   - He will call with any questions or concerns in the interim.       Erika Orban, ARNP  Nurse Practitioner  Department of Urology   [facility]     I spent a total of 45 minutes for the patient's care on the date of the service.

## 2022-01-09 NOTE — Telephone Encounter (Signed)
Called Nathan [name] to let him know his PSA returned at 70.  This is an elevation compared to six weeks ago.  Advised him that next step would be to proceed with PNBx. He is in agreement and would like to have procedure done as soon as he can.  We will reach out to him for scheduling.

## 2022-01-15 ENCOUNTER — Ambulatory Visit (HOSPITAL_COMMUNITY): Payer: Self-pay

## 2022-01-15 ENCOUNTER — Encounter (HOSPITAL_COMMUNITY): Payer: Self-pay

## 2022-01-15 ENCOUNTER — Ambulatory Visit (HOSPITAL_BASED_OUTPATIENT_CLINIC_OR_DEPARTMENT_OTHER): Payer: Medicare HMO

## 2022-01-15 ENCOUNTER — Other Ambulatory Visit (HOSPITAL_BASED_OUTPATIENT_CLINIC_OR_DEPARTMENT_OTHER): Payer: Self-pay | Admitting: Nurse Practitioner

## 2022-01-15 ENCOUNTER — Ambulatory Visit: Payer: Medicare HMO | Attending: Nurse Practitioner

## 2022-01-15 VITALS — BP 118/71 | HR 64 | Resp 18

## 2022-01-15 DIAGNOSIS — R972 Elevated prostate specific antigen [PSA]: Secondary | ICD-10-CM | POA: Insufficient documentation

## 2022-01-15 DIAGNOSIS — Z01818 Encounter for other preprocedural examination: Secondary | ICD-10-CM | POA: Insufficient documentation

## 2022-01-15 DIAGNOSIS — Z01810 Encounter for preprocedural cardiovascular examination: Secondary | ICD-10-CM

## 2022-01-15 LAB — URINALYSIS WITH REFLEX CULTURE
Bacteria, URN: NONE SEEN
Bilirubin (Qual), URN: NEGATIVE
Epith Cells_Renal/Trans,URN: NEGATIVE /HPF
Epith Cells_Squamous, URN: NEGATIVE /LPF
Glucose Qual, URN: NEGATIVE
Ketones, URN: NEGATIVE
Leukocyte Esterase, URN: NEGATIVE
Nitrite, URN: NEGATIVE
Occult Blood, URN: NEGATIVE
RBC, URN: NEGATIVE /HPF
Specific Gravity, URN: 1.023 g/mL (ref 1.006–1.027)
WBC, URN: NEGATIVE /HPF
pH, URN: 5.5 (ref 5.0–8.0)

## 2022-01-15 NOTE — Progress Notes (Signed)
Collected urine and sent to laboratory.

## 2022-01-15 NOTE — Progress Notes (Signed)
Discussed pre op education in clinic with pt for prostate needle biopsy, cystoscopy scheduled 01/24/22 with Dr. Elizebeth Brooking. Pt provided with pre op packet. Confirmed understanding of all information provided.

## 2022-01-15 NOTE — Anesthesia Preprocedure Evaluation (Addendum)
Patient: Nathan [name]    Procedure Information       Date/Time: 01/24/22 0915    Procedures:       PROSTATE BIOPSY WITH TRANSRECTAL ULTRASOUND GUIDANCE (Perineum)      CYSTOSCOPY (Bladder)    Location: [facility] / [facility]    Surgeons: Nonda Lou, MD          HPI: from Urology clinic note 01/09/22, Nathan [name], ARNP:  Taygen [name] is a 71 year old male with T2DM, asthma, CKD, gout and elevated PSA.  We discussed PNBx procedure at length and should he decide to proceed he would like to have it done under anesthesia.  he has a >68% chance of PNBx pathology indicating prostate cancer.      Relevant Problems   Pulmonary   (+) Mild intermittent asthma without complication      Endo/Immunology   (+) Type 2 diabetes mellitus with hyperglycemia, with long-term current use of insulin ([place])      Surgery   (+) Obesity, unspecified      Urology   (+) PSA elevation      Vascular Surgery   (+) Subacute massive pulmonary embolism (2022)     Relevant surgical history:   Past Surgical History:   Procedure Laterality Date   . ANKLE SURGERY Right     achilles x2         Medications:     Outpatient:   Current Outpatient Medications   Medication Instructions   . Albuterol Sulfate HFA 108 (90 Base) MCG/ACT Inhalation Aero Soln 2 puffs, Inhalation, Every 6 hours PRN   . allopurinol (ZYLOPRIM) 200 mg, Oral, Daily   . apixaban (ELIQUIS) 5 mg, Oral, 2 times daily   . atorvastatin 40 MG tablet Take 1 tablet (40 mg) by mouth every evening. To lower cholesterol.   . blood glucose test strip 1 strip, Does not apply, 4 times daily, Use to check blood sugar.   . Fluticasone Propionate HFA 110 MCG/ACT Inhalation Aerosol 1 puff, Inhalation, Every 12 hours   . glucometer (OneTouch Verio Flex System) w/Device kit Use to check blood sugar 4 times a day.   Marland Kitchen glucose 16 g, Oral, Every 15 min PRN, Recheck blood sugar as needed.   . insulin NPH & REGULAR (HumuLIN 70/30 KwikPen) (70-30) 100 UNIT/ML pen-injector Inject 35 units  under the skin every morning AND 20 units every evening.   Marland Kitchen ketoconazole 2 % shampoo apply topically face and scalp if needed   . lancet (OneTouch Delica Plus) 54Y Use 1 to check blood glucose 4 times a day.   . metFORMIN (GLUCOPHAGE) 500 mg, Oral, 2 times daily with meals   . pen needle (TechLite Pen Needles) 31G X 8 MM Use to inject insulin 2 times a day.   . Triamcinolone Acetonide 0.1 % External Ointment Neck, 2 times daily                Review of patient's allergies indicates:  No Known Allergies    Social History:   Social History     Tobacco Use   . Smoking status: Former     Types: Cigarettes     Start date: 07/21/1961     Quit date: 07/22/1971     Years since quitting: 50.5   . Smokeless tobacco: Never   Substance Use Topics   . Alcohol use: Not Currently   . Drug use: Yes     Types: Marijuana, Inhaled  Comment: 1-2x week       Medical History and Review of Systems  Documentation reviewed: Electronic medical record.    Source of information: In person visit.  Previous anesthesia: Yes (general)    Functional Status   Able to walk 1 city block (200 yards), able to lay flat and still for 30 minutes, able to climb 1 flight of stairs without stopping and able to climb 2 flights of stairs or more without stopping.       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 Denies any loose teeth    Cardiovascular  Rhythm:  Irregular  Rate:  Normal   no murmur    Pulmonary  normal    Breath sounds clear to auscultation      Other Physical Exam Findings:   Temp:  [36 C] 36 C  Pulse:  [64] 64  Resp:  [18] 18  BP: (118)/(71) 118/71  SpO2:  [96 %] 96 %   Ht: '5\' 5"'  (1.651 m)  Weight: 110.2 kg (243 lb)  BMI: 40.44 kg/m       Labs: (last year)    BMP  CBC/Coags   Na 140 11/25/2021  Hb - -   K 4.7 11/25/2021  HCT - -   Cl 107 11/25/2021  WBC - -   HCO3 26 11/25/2021  PLT - -   BUN 27 (H) 11/25/2021  INR - -   Cr 1.56 (H) 11/25/2021  PT - -   Glu 110 11/25/2021  PTT - -       Misc   eGFR 48 (L) 11/25/2021  MCV - -   A1C 6.1 (H) 11/25/2021  BNP - -       LFTs   AST 18 11/25/2021  Albumin 4.1 11/25/2021   ALT 19 11/25/2021  Protein 7.5 11/25/2021   Alk Phos 94 11/25/2021  T Bili 0.6 11/25/2021         Relevant procedures / diagnostic studies: none    EKG 01/15/22  Sinus rhythm with frequent premature ventricular complexes  Right bundle branch block  Left anterior fascicular block  Bifascicular block  Minimal voltage criteria for LVH, may be normal variant  Abnormal ECG.    TTE 08/06/2021  Conclusion  Normal LV size with mildly reduced systolic function. Regional wall motion  abnormalities shown below.     Normal RV size and systolic function. Normal RA pressure. Could not evaluate  PA pressure. RV/LV ratio 0.7 which is  normal.     Normal valve structure and function.     Compared with prior study from 4/14//22, RV size and systolic function has  improved.    EKG 11/01/2020  Normal sinus rhythm  Right bundle branch block  Left anterior fascicular block   Bifascicular block   T wave abnormality, consider lateral ischemia  Abnormal ECG  When compared with ECG of 01-Nov-2020 12:16,  T wave inversion now evident in Anterior leads  Confirmed by [facility] M.D., Dione Housekeeper [phone]) on 11/02/2020 6:23:12 PM      PRE-ANESTHESIA CLINIC  DISCUSSION   Assessment / Additional Concerns:      Pt reports apixaban QD, eGFR 48.  Discussed with Dr. Scarlette Ar, will hold for 2 days prior to surgery.   PAT Attending Comments:        01/16/22 I reviewed this chart and agree with the assessment     Concepcion Elk, MD.   PE was > 3 months ago so no high thromboembolic risk, coming for procedure with standard bleeding risk, eGFR < 50, so will hold Apixaban  2 days.    ANESTHESIA PLAN   Preoperative Discussion:     Discussed Code Status for perioperative care?: No    Informed Consent:     Anesthesia Plan discussed with:        Patient    ASA Score:     ASA: 4  Planned Anesthetic Type:      general    Risk Calculators / Scores:     PONV: Low Risk  Total Score: 1            Non-smoker        Criteria that do not apply:    Male patient    History of PONV    History of motion sickness    Intended opioid administration

## 2022-01-15 NOTE — Preprocedure Instructions (Signed)
Pre-Surgery Instructions:   Medication Instructions    Albuterol Sulfate HFA 108 (90 Base) MCG/ACT Inhalation Aero Soln Take day of surgery    allopurinol 100 MG tablet Hold morning of surgery    apixaban 5 MG tablet Hold 2 days prior to surgery    atorvastatin 40 MG tablet Take the evening before surgery    Fluticasone Propionate HFA 110 MCG/ACT Inhalation Aerosol Take day of surgery    glucose 4 g chewable tablet Hold morning of surgery    insulin NPH & REGULAR (HumuLIN 70/30 KwikPen) (70-30) 100 UNIT/ML pen-injector Take 20 units the evening before surgery.  HOLD morning dose on the day of surgery.    ketoconazole 2 % shampoo Hold morning of surgery    loratadine 10 MG tablet Hold morning of surgery    metFORMIN 500 MG tablet Hold morning of surgery    Triamcinolone Acetonide 0.1 % External Ointment Hold morning of surgery     Pre-Surgery Instructions    For any unexpected problems (such as cold, flu, or fever) or if you are unable to keep your appointment call one of the following:   The operating room-the evening prior to surgery after 8 pm, [phone]  Call ambulatory surgery unit [phone] on the day of surgery.     Check in on the ground floor of the [facility] building on the day of surgery    Your arrival time comes from your surgeon's office.  The patient care coordinator will call prior to surgery as below.  If you do not already have an arrival time: you will receive a call the day before your surgery with your check in time.    If your surgery is on a Monday,  you will receive a call on Friday.   UROLOGY CLINIC [facility]:  [phone]    Pre procedure instructions:   Drink plenty of water (6-8 glasses) the day BEFORE surgery.     No FOOD and No MILK OR MILK products 6 hours prior to arrival.    Up until 2 hours prior to arrival you may have CLEAR liquids only (water, apple juice, Gatorade or Ensure clear) NO MILK OR MILK PRODUCTS.  Nothing by mouth for the 2 hours prior to your hospital arrival.     Take  medications on the morning so surgery with a small sip of water.    NO ELIQUIS (APIXABAN) ON JULY 5 & JULY 6    Take Atorvastatin and 20 units of insulin on the evening of JULY 6   .  DO NOT TAKE ANY MEDICATIONS ON JULY 7, this includes insulin and metformin, and ointments & creams.    Use your inhalers on the morning of surgery and bring them with you to the hospital.    HOLD NSAIDS (ibuprofen, motrin, naproxen, aleve, ketorolac or diclofenac) 7 days prior to surgery.  Tylenol (acetaminophen) is ok to take.    HOLD fish oil, flax seed oil and all herbal supplements 7 days prior to surgery.        Directions to [facility] Check In:  BY CAR: Park in P2 garage (entrance is on [address], between [address] and [address])   Take the parking garage elevators to L level [employer]).   Walk outside and you will see the Emergency and [place] entrance on the opposite corner. Enter there.  From there take the [facility] elevator up to the 6th floor and cross the sky bridge.   Once over the sky  bridge you will be in the [employer].   Take the [facility] elevator down to floor G [employer])   The check in is across the hall on your right.  H

## 2022-01-17 ENCOUNTER — Telehealth (HOSPITAL_BASED_OUTPATIENT_CLINIC_OR_DEPARTMENT_OTHER): Payer: Self-pay | Admitting: Urology

## 2022-01-17 NOTE — Telephone Encounter (Signed)
RETURN CALL: Voicemail - General Message      SUBJECT:  Cancellation/Reschedule Request     REASON: Surgery too early   ADDITIONAL INFORMATION: Patient scheduled for surgery 7/7 at 8:15am and is requesting to be scheduled later in the day. Please assist.

## 2022-01-20 ENCOUNTER — Telehealth (HOSPITAL_BASED_OUTPATIENT_CLINIC_OR_DEPARTMENT_OTHER): Payer: Self-pay | Admitting: Nurse Practitioner

## 2022-01-20 LAB — EKG 12 LEAD
Atrial Rate: 71 {beats}/min
P Axis: 38 degrees
P-R Interval: 158 ms
Q-T Interval: 430 ms
QRS Duration: 152 ms
QTC Calculation: 467 ms
R Axis: -72 degrees
T Axis: 33 degrees
Ventricular Rate: 71 {beats}/min

## 2022-01-20 NOTE — Telephone Encounter (Signed)
Called patient to confirm he will stop his apixaban x 48 hours prior to surgery.  We discussed his preoperative medication dosing at length.  He stated he will remember stopping his apixaban after Tuesday night and will hold all medications on morning of surgery (with the exception of albuterol).      Reviewed time of arrival.    Nathan [name] is scheduled for prostate needle biopsy on 01/24/2022 with Dr. Charlett Nose Hagedorn.

## 2022-01-22 ENCOUNTER — Telehealth (HOSPITAL_BASED_OUTPATIENT_CLINIC_OR_DEPARTMENT_OTHER): Payer: Self-pay | Admitting: Urology

## 2022-01-22 NOTE — Telephone Encounter (Signed)
Left voicemail for patient in regards to his request to switch to a later check in time for surgery on [id number]/72023. Let patient know that he will stay 2nd case and check in time will remain at 0845am due to a longer case going after him. Told him to call back if he had any questions or concerns on surgery date/time.

## 2022-01-22 NOTE — Telephone Encounter (Signed)
RETURN CALL: Voicemail - Detailed Message      SUBJECT:  General Message     MESSAGE: CCR delivered message to patient per previous note.  Patient states he is not an early morning person and has to catch the bus from [place] area but he will try to make that check in time.  You can call him back again if you need to.

## 2022-01-23 ENCOUNTER — Other Ambulatory Visit (HOSPITAL_BASED_OUTPATIENT_CLINIC_OR_DEPARTMENT_OTHER): Payer: Self-pay

## 2022-01-24 ENCOUNTER — Ambulatory Visit (HOSPITAL_BASED_OUTPATIENT_CLINIC_OR_DEPARTMENT_OTHER): Payer: Medicare HMO | Admitting: Family Medicine

## 2022-01-24 ENCOUNTER — Other Ambulatory Visit (HOSPITAL_BASED_OUTPATIENT_CLINIC_OR_DEPARTMENT_OTHER): Payer: Self-pay

## 2022-01-24 ENCOUNTER — Ambulatory Visit (HOSPITAL_COMMUNITY): Payer: Medicare HMO | Admitting: Urology

## 2022-01-24 ENCOUNTER — Ambulatory Visit
Admission: RE | Admit: 2022-01-24 | Discharge: 2022-01-24 | Disposition: A | Discharge status: Home / Self Care | Payer: Medicare HMO | Attending: Urology | Admitting: Urology

## 2022-01-24 ENCOUNTER — Encounter (HOSPITAL_COMMUNITY)
Admission: RE | Disposition: A | Discharge status: Home / Self Care | Payer: Self-pay | Source: Home / Self Care | Attending: Urology

## 2022-01-24 ENCOUNTER — Ambulatory Visit (HOSPITAL_COMMUNITY): Payer: Medicare HMO

## 2022-01-24 DIAGNOSIS — Z86718 Personal history of other venous thrombosis and embolism: Secondary | ICD-10-CM | POA: Insufficient documentation

## 2022-01-24 DIAGNOSIS — Z7984 Long term (current) use of oral hypoglycemic drugs: Secondary | ICD-10-CM | POA: Insufficient documentation

## 2022-01-24 DIAGNOSIS — Z86711 Personal history of pulmonary embolism: Secondary | ICD-10-CM | POA: Insufficient documentation

## 2022-01-24 DIAGNOSIS — E785 Hyperlipidemia, unspecified: Secondary | ICD-10-CM | POA: Insufficient documentation

## 2022-01-24 DIAGNOSIS — J4520 Mild intermittent asthma, uncomplicated: Secondary | ICD-10-CM | POA: Insufficient documentation

## 2022-01-24 DIAGNOSIS — Z01818 Encounter for other preprocedural examination: Secondary | ICD-10-CM

## 2022-01-24 DIAGNOSIS — C61 Malignant neoplasm of prostate: Secondary | ICD-10-CM

## 2022-01-24 DIAGNOSIS — K219 Gastro-esophageal reflux disease without esophagitis: Secondary | ICD-10-CM | POA: Insufficient documentation

## 2022-01-24 DIAGNOSIS — Z794 Long term (current) use of insulin: Secondary | ICD-10-CM | POA: Insufficient documentation

## 2022-01-24 DIAGNOSIS — R972 Elevated prostate specific antigen [PSA]: Secondary | ICD-10-CM | POA: Insufficient documentation

## 2022-01-24 DIAGNOSIS — Z87891 Personal history of nicotine dependence: Secondary | ICD-10-CM | POA: Insufficient documentation

## 2022-01-24 DIAGNOSIS — E1165 Type 2 diabetes mellitus with hyperglycemia: Secondary | ICD-10-CM | POA: Insufficient documentation

## 2022-01-24 DIAGNOSIS — Z7901 Long term (current) use of anticoagulants: Secondary | ICD-10-CM | POA: Insufficient documentation

## 2022-01-24 DIAGNOSIS — Z79899 Other long term (current) drug therapy: Secondary | ICD-10-CM | POA: Insufficient documentation

## 2022-01-24 LAB — GLUCOSE POC, HMC
Glucose (POC): 90 mg/dL (ref 62–125)
Glucose (POC): 81 mg/dL (ref 62–125)

## 2022-01-24 SURGERY — BIOPSY, PROSTATE, WITH TRANSRECTAL ULTRASOUND GUIDANCE
Anesthesia: General | Site: Perineum | Wound class: Class I/ Clean

## 2022-01-24 MED ORDER — LIDOCAINE HCL (PF) 1 % IJ SOLN
INTRAMUSCULAR | Status: AC
Start: 2022-01-24 — End: 2022-01-24
  Filled 2022-01-24: qty 5

## 2022-01-24 MED ORDER — PROPOFOL 200 MG/20ML IV EMUL
INTRAVENOUS | Status: AC
Start: 2022-01-24 — End: 2022-01-24
  Filled 2022-01-24: qty 20

## 2022-01-24 MED ORDER — MIDAZOLAM HCL (PF) 2 MG/2ML IJ SOLN
INTRAMUSCULAR | Status: AC
Start: 2022-01-24 — End: 2022-01-24
  Filled 2022-01-24: qty 2

## 2022-01-24 MED ORDER — ONDANSETRON HCL 4 MG/2ML IJ SOLN
INTRAMUSCULAR | Status: AC
Start: 2022-01-24 — End: 2022-01-24
  Filled 2022-01-24: qty 2

## 2022-01-24 MED ORDER — ROCURONIUM BROMIDE 50 MG/5ML IV SOLN
INTRAVENOUS | Status: AC
Start: 2022-01-24 — End: 2022-01-24
  Filled 2022-01-24: qty 5

## 2022-01-24 MED ORDER — LACTATED RINGERS IV SOLN
10.0000 mL/h | INTRAVENOUS | Status: DC
Start: 2022-01-24 — End: 2022-01-24

## 2022-01-24 MED ORDER — ALBUTEROL SULFATE HFA 108 (90 BASE) MCG/ACT IN AERS
INHALATION_SPRAY | RESPIRATORY_TRACT | Status: AC
Start: 2022-01-24 — End: 2022-01-24
  Filled 2022-01-24: qty 8

## 2022-01-24 MED ORDER — FENTANYL CITRATE (PF) 100 MCG/2ML IJ SOLN
INTRAMUSCULAR | Status: AC
Start: 2022-01-24 — End: 2022-01-24
  Filled 2022-01-24: qty 2

## 2022-01-24 MED ORDER — LIDOCAINE HCL 1 % IJ SOLN
INTRAMUSCULAR | Status: DC | PRN
Start: 2022-01-24 — End: 2022-01-24
  Administered 2022-01-24 (×3): 50 mg via INTRAVENOUS

## 2022-01-24 MED ORDER — CIPROFLOXACIN HCL 500 MG OR TABS
500.0000 mg | ORAL_TABLET | Freq: Two times a day (BID) | ORAL | 0 refills | Status: DC
Start: 2022-01-24 — End: 2023-03-20
  Filled 2022-01-24: qty 3, 2d supply, fill #0

## 2022-01-24 MED ORDER — ACETAMINOPHEN 500 MG OR TABS
1000.0000 mg | ORAL_TABLET | Freq: Four times a day (QID) | ORAL | 0 refills | Status: DC | PRN
Start: 2022-01-24 — End: 2023-03-20
  Filled 2022-01-24: qty 20, 3d supply, fill #0

## 2022-01-24 MED ORDER — SUGAMMADEX SODIUM 200 MG/2ML IV SOLN
INTRAVENOUS | Status: AC
Start: 2022-01-24 — End: 2022-01-24
  Filled 2022-01-24: qty 2

## 2022-01-24 MED ORDER — SUGAMMADEX SODIUM 200 MG/2ML IV SOLN
INTRAVENOUS | Status: DC | PRN
Start: 2022-01-24 — End: 2022-01-24
  Administered 2022-01-24: 200 mg via INTRAVENOUS

## 2022-01-24 MED ORDER — CEFTRIAXONE SODIUM IN DEXTROSE 20 MG/ML IV SOLN
INTRAVENOUS | Status: DC | PRN
Start: 2022-01-24 — End: 2022-01-24
  Administered 2022-01-24: 2 g via INTRAVENOUS

## 2022-01-24 MED ORDER — LIDOCAINE HCL URETHRAL/MUCOSAL 2 % EX PRSY
PREFILLED_SYRINGE | CUTANEOUS | Status: AC
Start: 2022-01-24 — End: 2022-01-24
  Filled 2022-01-24: qty 22

## 2022-01-24 MED ORDER — PROPOFOL 10 MG/ML IV EMUL WRAPPER (OSM ONLY)
INTRAVENOUS | Status: DC | PRN
Start: 2022-01-24 — End: 2022-01-24
  Administered 2022-01-24 (×2): 80 mg via INTRAVENOUS
  Administered 2022-01-24: 40 mg via INTRAVENOUS

## 2022-01-24 MED ORDER — ACETAMINOPHEN 325 MG OR TABS
650.0000 mg | ORAL_TABLET | ORAL | Status: DC
Start: 2022-01-24 — End: 2022-01-24

## 2022-01-24 MED ORDER — CEFTRIAXONE SODIUM 1 G IJ SOLR
INTRAMUSCULAR | Status: AC
Start: 2022-01-24 — End: 2022-01-24
  Filled 2022-01-24: qty 2

## 2022-01-24 MED ORDER — IBUPROFEN 400 MG OR TABS
400.0000 mg | ORAL_TABLET | Freq: Four times a day (QID) | ORAL | 0 refills | Status: DC | PRN
Start: 2022-01-24 — End: 2023-03-20
  Filled 2022-01-24: qty 20, 5d supply, fill #0

## 2022-01-24 MED ORDER — HYDROMORPHONE HCL 1 MG/ML IJ SOLN
0.2000 mg | INTRAMUSCULAR | Status: DC | PRN
Start: 2022-01-24 — End: 2022-01-24

## 2022-01-24 MED ORDER — ROCURONIUM BROMIDE 50 MG/5ML IV SOLN
INTRAVENOUS | Status: DC | PRN
Start: 2022-01-24 — End: 2022-01-24
  Administered 2022-01-24: 40 mg via INTRAVENOUS

## 2022-01-24 MED ORDER — ONDANSETRON HCL 4 MG/2ML IJ SOLN
4.0000 mg | INTRAMUSCULAR | Status: DC | PRN
Start: 2022-01-24 — End: 2022-01-24

## 2022-01-24 MED ORDER — LIDOCAINE HCL (PF) 1 % IJ SOLN
INTRAMUSCULAR | Status: DC | PRN
Start: 2022-01-24 — End: 2022-01-24
  Administered 2022-01-24: 10 mL via INTRAMUSCULAR

## 2022-01-24 MED ORDER — FENTANYL CITRATE (PF) 100 MCG/2ML IJ SOLN
25.0000 ug | INTRAMUSCULAR | Status: DC | PRN
Start: 2022-01-24 — End: 2022-01-24

## 2022-01-24 MED ORDER — FENTANYL CITRATE (PF) 50 MCG/ML IJ SOLN WRAPPER (ANESTHESIA OSM ONLY)
INTRAMUSCULAR | Status: DC | PRN
Start: 2022-01-24 — End: 2022-01-24
  Administered 2022-01-24 (×2): 50 ug via INTRAVENOUS

## 2022-01-24 SURGICAL SUPPLY — 10 items
DEVICE BIOPSY 5 7/8IN 18GA ACCUCORE 1 ACTION STD ×3 IMPLANT
DRAPE CLOTH 54IN (Drape) ×2 IMPLANT
DRAPE CLOTH 54INCH (Drape) ×3
DRESSING TELFA 8INX3IN ABSORB PAD POLYESTER (Dressing) ×3 IMPLANT
GOWN AURORA AAMI LEVEL 4 XL BLUE (Gown) ×6 IMPLANT
GUIDEWIRE STD .035IN 145CM 3CM (Guidewire) ×3 IMPLANT
KIT ENDOSCOPIC CLEANING VIA BEDSIDE BASIN SPONGE DISPOSABLE (Pack) ×3 IMPLANT
PACK CUST CYSTO HMC (Pack) ×3 IMPLANT
PACK TOWEL 6'S (Towel) ×3 IMPLANT
TRAY SKIN SCRUB 8IN VINYL COTTON 6 WING 6 SPONGE (Pack) ×3 IMPLANT

## 2022-01-24 NOTE — Discharge Instructions (Addendum)
Self Care / Activity  You may shower    Avoid vigorous activity for 48 hours then resume all normal activity.  Avoid bearing down (example: straining to have a bowel movement) during any sort of activity. One of the biggest reasons why people start to bleed again after this  surgery is bearing down while trying to have a bowel movement. Please avoid this. Take medications as needed for constipation.   Drink at least 8 oz of water every 2-3 hours  You may see some blood in your urine for up to 1 week. You will see blood in your stool for around 1 week. This is normal. Please call if it continues to get worse. You will have blood in the semen for several weeks.   If you have a fever, present to the emergency department immediately.     Medications  Take tylenol and ibuprofen  as needed for pain  Continue taking your antibiotic until it is gone.   You may restart your Apixaban in 3 days, if your urine and stool are no longer bloody.  Otherwise, wait until there is no longer blood in your urine or stool.     Call / Return Precautions  Please call Urology clinic (during normal business hours) or [facility] paging operator (outside of normal business hours) if you develop any fevers greater than 101.5, chest pain, shortness of breath, severe nausea/vomiting, severe pain that is uncontrollable with medications and persistent, unilateral extremity swelling or any other urgent concerns. You may be instructed to go to the Emergency Room if necessary.   If calling after hours, call the [facility] paging operator at [phone] and ask for on-call Urology resident. You may need to tell them the name of your surgeon and which [facility] you get your care    Follow up  We will reach out to you with the results of the biopsy.     Discharge Instructions: After Your Surgery  You've just had surgery. During surgery, you were given medicine called anesthesia to keep you relaxed and free of pain. After surgery, you may have some pain or nausea.  This is common. Here are some tips for feeling better and getting well after surgery.     Stay on schedule with your medicine.     Going home  Your healthcare provider will show you how to take care of yourself when you go home. They'll also answer your questions. Have an adult family member or friend drive you home. For the first 24 hours after your surgery:  Don't drive or use heavy equipment.  Don't make important decisions or sign legal papers.  Take medicines as directed.  Don't drink alcohol.  Have someone stay with you, if needed. They can watch for problems and help keep you safe.  Be sure to go to all follow-up visits with your healthcare provider. And rest after your surgery for as long as your provider tells you to.  Coping with pain  If you have pain after surgery, pain medicine will help you feel better. Take it as directed, before pain becomes severe. Also, ask your healthcare provider or pharmacist about other ways to control pain. This might be with heat, ice, or relaxation. And follow any other instructions your surgeon or nurse gives you.  Tips for taking pain medicine  To get the best relief possible, remember these points:  Pain medicines can upset your stomach. Taking them with a little food may help.  Most pain  relievers taken by mouth need at least 20 to 30 minutes to start to work.  Don't wait till your pain becomes severe before you take your medicine. Try to time your medicine so that you can take it before starting an activity. This might be before you get dressed, go for a walk, or sit down for dinner.  Constipation is a common side effect of some pain medicines. Call your healthcare provider before taking any medicines such as laxatives or stool softeners to help ease constipation. Also ask if you should skip any foods. Drinking lots of fluids and eating foods such as fruits and vegetables that are high in fiber can also help. Remember, don't take laxatives unless your surgeon has  prescribed them.  Drinking alcohol and taking pain medicine can cause dizziness and slow your breathing. It can even be deadly. Don't drink alcohol while taking pain medicine.  Pain medicine can make you react more slowly to things. Don't drive or run machinery while taking pain medicine.  Your healthcare provider may tell you to take acetaminophen to help ease your pain. Ask them how much you're supposed to take each day. Acetaminophen or other pain relievers may interact with your prescription medicines or other over-the-counter (OTC) medicines. Some prescription medicines have acetaminophen and other ingredients in them. Using both prescription and OTC acetaminophen for pain can cause you to accidentally overdose. Read the labels on your OTC medicines with care. This will help you to clearly know the list of ingredients, how much to take, and any warnings. It may also help you not take too much acetaminophen. If you have questions or don't understand the information, ask your pharmacist or healthcare provider to explain it to you before you take the OTC medicine.  Managing nausea  Some people have an upset stomach (nausea) after surgery. This is often because of anesthesia, pain, or pain medicine, less movement of food in the stomach, or the stress of surgery. These tips will help you handle nausea and eat healthy foods as you get better. If you were on a special food plan before surgery, ask your healthcare provider if you should follow it while you get better. Check with your provider on how your eating should progress. It may depend on the surgery you had. These general tips may help:  Don't push yourself to eat. Your body will tell you when to eat and how much.  Start off with clear liquids and soup. They're easier to digest.  Next try semi-solid foods as you feel ready. These include mashed potatoes, applesauce, and gelatin.  Slowly move to solid foods. Don't eat fatty, rich, or spicy foods at first.  Don't  force yourself to have 3 large meals a day. Instead eat smaller amounts more often.  Take pain medicines with a small amount of solid food, such as crackers or toast. This helps prevent nausea.  When to call your healthcare provider  Call your healthcare provider right away if you have any of these:  You still have too much pain, or the pain gets worse, after taking the medicine. The medicine may not be strong enough. Or there may be a complication from the surgery.  You feel too sleepy, dizzy, or groggy. The medicine may be too strong.  Side effects such as nausea or vomiting. Your healthcare provider may advise taking other medicines to .  Skin changes such as rash, itching, or hives. This may mean you have an allergic reaction. Your provider may advise  taking other medicines.  The incision looks different (for instance, part of it opens up).  Bleeding or fluid leaking from the incision site, and weren't told to expect that.  Fever of 100.39F (38C) or higher, or as directed by your provider.  Call 911  Call 911 right away if you have:  Trouble breathing  Facial swelling     If you have obstructive sleep apnea  You were given anesthesia medicine during surgery to keep you comfortable and free of pain. After surgery, you may have more apnea spells because of this medicine and other medicines you were given. The spells may last longer than normal.   At home:  Keep using the continuous positive airway pressure (CPAP) device when you sleep. Unless your healthcare provider tells you not to, use it when you sleep, day or night. CPAP is a common device used to treat obstructive sleep apnea.  Talk with your provider before taking any pain medicine, muscle relaxants, or sedatives. Your provider will tell you about the possible dangers of taking these medicines.  Contact your provider if your sleeping changes a lot even when taking medicines as directed.  [employer] last reviewed this educational content on 04/20/2020      2000-2022 [employer]. All rights reserved. This information is not intended as a substitute for professional medical care. Always follow your healthcare professional's instructions.

## 2022-01-24 NOTE — Anesthesia Procedure Notes (Signed)
Airway Placement    Staff:  Performing Provider: Talmadge Chad, MD  Authorizing Provider: Thilen, Antony Haste, MD    Airway management:   Patient location: OR/Procedural area  Final airway type: Endotracheal airway  Intubation reason: General anesthesia/planned procedure    Induction:  Positioning: supine  Preoxygenation: Yes  Mask Ventilation: Grade 1 - Ventilated by mask     Intubation:    Number of Attempts: 1    Final Attempt   Airway Type: ETT  Primary Laryngoscopy: Macintosh  Blade Size: 3  Laryngoscopic View: Grade I  ETT Type: standard, cuffed  ETT Route: oral  Size: 7.5  ETT secured with adhesive tape  Depth at: teeth  (23cm)  Bite Block: handmade cotton & tape - unilateral    Assessment:  Confirmation: waveform capnography and direct visualization  Difficult Airway: no  Procedure Abandoned: no    Date / Time Airway Secured / Re-Secured:  01/24/2022 10:47 AM

## 2022-01-24 NOTE — Op Note (Signed)
Urology Operative Note   Nathan [name] - DOB: [birth year] 71 year old male) MRN: [id number]  Procedure Date: 01/24/2022 [facility] OR         Preoperative Diagnosis   Elevated PSA     Postoperative Diagnosis   Same     Operative Procedure   Diagnostic transrectal ultrasound  Ultrasound guidance for prostate needle biopsy  Prostate needle biopsies     Attending   Attending: Marcos Eke, MD     Surgeons present   Attending: Marcos Eke, MD     Residents: Kendrick Fries MD, Dalia Heading, MD  Fellow: Edwena Felty, MD    Anesthesia   Injectable lidocaine without epi, 5.0 cc in each neurovascular bundle       Antibiotics  Ceftriaxone IM    Specimens   Prostate needle biopsies     Consent   Informed consent was obtained from the patient.     Indications   [name] is a 71 year old year old 73 or African-American man here for prostate biopsy.    Findings:  1.  His prostate volume was estimated at 36.6 cc.      Procedure   The patient was taken to the Examination Room after receiving pre-operative antibiotics and informed consent obtained.  A time out was performed confirming the patient and the planned procedure.  The patient was placed on the examination table in the left lateral decubitus position. The ultrasound transducer was introduced into the rectum.  The prostate was scanned in the axial and sagittal planes, and the findings were as noted above.     Ten cc of 1.0% lidocaine was injected, 5cc near each neurovascular bundle.  Under ultrasonic guidance, the prostate needle biopsy was performed at the base, midportion, and apex of each lobe of the prostate.  Two cores were obtained from each sector for a total of twelve cores. The probe was removed. The patient tolerated the procedure well.      Attending Statement  I , Marcos Eke, was present for the entire procedure from beginning to end.       Disposition   He was discharged home and tolerated the procedure well.  We will  contact the patient with the results.

## 2022-01-24 NOTE — Anesthesia Postprocedure Evaluation (Signed)
Patient: Erika Hussar    Procedure Summary:   Date: 01/24/2022  Room/Location: [facility] OR [facility] / [facility] OR    Anesthesia Start: 10:33 AM  Anesthesia Stop: 11:41 AM    Procedure(s):  PROSTATE BIOPSY WITH TRANSRECTAL ULTRASOUND GUIDANCE  Post-op Diagnosis     * Elevated PSA [R97.20]    Responsible Provider:   Matilde Bash, MD  ASA Status: 4     Vitals Value Taken Time   BP 130/80 01/24/22 1200   Temp 36.3 C 01/24/22 1200   Pulse 65 01/24/22 1200   SpO2 99 % 01/24/22 1200       Place of evaluation: PACU    Patient participation: patient participated    Level of consciousness: fully conscious    Patient pain control satisfaction: patient is satisfied with level of pain control    Airway patency: patent    Cardiovascular status during assessment: stable    Respiratory status during assessment: breathing comfortably    Anesthetic complications: no    Intravascular volume status assessment: euvolemic    Nausea / vomiting: patient is not experiencing nausea      Planned post-operative disposition at time of assessment: hospital discharge

## 2022-01-24 NOTE — H&P (Signed)
Urology H and P Update:      I evaluated the patient at bedside today.  No changes to the patient's History and Physical from 01/09/22.  Will proceed with: Prostate biopsy.    Edwena Felty, MD  Fellow in [place] of [facility] and [facility]

## 2022-01-27 ENCOUNTER — Encounter (HOSPITAL_COMMUNITY): Payer: Self-pay

## 2022-01-28 LAB — PATHOLOGY, SURGICAL

## 2022-01-29 ENCOUNTER — Telehealth (HOSPITAL_BASED_OUTPATIENT_CLINIC_OR_DEPARTMENT_OTHER): Payer: Self-pay | Admitting: Urology

## 2022-01-29 ENCOUNTER — Other Ambulatory Visit (HOSPITAL_BASED_OUTPATIENT_CLINIC_OR_DEPARTMENT_OTHER): Payer: Self-pay | Admitting: Urology

## 2022-01-29 DIAGNOSIS — C61 Malignant neoplasm of prostate: Secondary | ICD-10-CM

## 2022-01-29 NOTE — Telephone Encounter (Signed)
I called him and discussed that he will need a CT and bone scan and I placed a referral to [facility].

## 2022-02-14 ENCOUNTER — Ambulatory Visit
Admission: RE | Admit: 2022-02-14 | Discharge: 2022-02-14 | Disposition: A | Discharge status: Home / Self Care | Payer: Medicare HMO | Attending: Diagnostic Radiology | Admitting: Diagnostic Radiology

## 2022-02-14 DIAGNOSIS — C61 Malignant neoplasm of prostate: Secondary | ICD-10-CM | POA: Insufficient documentation

## 2022-02-14 LAB — CREATININE BY I_STAT POC, HMC: Creatinine (POC): 1.6 mg/dL — ABNORMAL HIGH (ref 0.51–1.18)

## 2022-02-14 MED ORDER — IOHEXOL 350 MG/ML IV SOLN
130.0000 mL | Freq: Once | INTRAVENOUS | Status: AC | PRN
Start: 2022-02-14 — End: 2022-02-14
  Administered 2022-02-14: 130 mL via INTRAVENOUS

## 2022-03-05 NOTE — Progress Notes (Addendum)
UROLOGY OUTPATIENT CONSULTATION:     CHIEF COMPLAINT/REASON FOR REFERRAL:  Prostate cancer    HISTORY OF PRESENT ILLNESS  Nathan [name] is a 71 year old male referred to me by Dr. Annamaria Helling Hagedorn for prostate cancer.    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  He was seen by Dr. Elizebeth Brooking on 01/24/2022 who performed a TRUS prostate biopsy.  He underwent further evaluation with CT AP on 02/14/2022 which demonstrated sclerotic lesions in the pelvis are concerning for prostate cancer metastases.  No gross hematuria.  No unintentional weight loss, bone pain, or anorexia.  Not sexually active, notes some erectile function.  Family history is positive for brain cancer in his mother who passed in her 50's. Denies any family history of breast, ovarian, or colon cancer.  Former smoker, no EtOH, no cannabis.  Retired, worked in Biomedical scientist. Single, lives in [place], [place].    He reports that he is not currently experiencing any pain.     AUA-SS: incomplete (details below)  SHIM: incomplete    International Prostate Symptom Score filled out by patient (03/10/22)   Incomplete emptying  N/A   Frequency  2   Intermittency  N/A   Urgency  N/A   Weak Stream  1   Straining  0   Nocturia  2        Total:  5     QOL due to urinary symptoms N/A      Imaging Findings  CT AP Date(02/14/2022)  FINDINGS:  Lower chest:  Unremarkable  Liver: Subcentimeter low-density lesion in segment III (3/34), too small to characterize.  Gallbladder: Unremarkable.  Bile ducts: Unremarkable.  Spleen: Unremarkable.  Pancreas: Unremarkable.  Adrenal glands: Unremarkable.  Kidneys and Ureters: Bilateral renal cysts. Additional subcentimeter too small to characterize bilateral  low-density lesions.  Lymph Nodes: No enlarged lymph nodes.   Vasculature: Scattered atherosclerosis without aneurysm.  Bowel: Unremarkable.  Reproductive organs: Heterogeneously enhancing prostate.  Bladder: Decompressed, limiting evaluation. Fatty infiltration around the bladder wall is likely stigmata of chronic inflammation.   Peritoneum: Unremarkable.  Abdominal wall: Small fat-containing umbilical hernia. Some focal skin thickening in the left lower quadrant is likely related to prior trauma or repeated subcutaneous medication injection.  Bones: Trace anterolisthesis of L4 on L5. Sclerotic lesion of the left parasymphyseal pubic bone (for example 3/131). Additional scattered sclerotic lesions in the pelvis including the left iliac bone (3/101) and right iliac bone (for example 900/50).     IMPRESSION  Sclerotic lesions in the pelvis are concerning for prostate cancer metastases given the clinical history.     No convincing evidence for intraperitoneal disease.    Past Medical Hx:    Past Medical History:   Diagnosis Date    Obesity, unspecified 11/24/2008    Unspecified asthma, with status asthmaticus 11/24/2008    Generalized osteoarthrosis, unspecified site 11/24/2008    Other and unspecified hyperlipidemia 35/57/3220    Urinary complications 25/42/7062    Diabetes mellitus ([place])     History of hyperlipidemia     Kidney disease     Myocardial infarction [facility])     Pulmonary embolism ([place])        Past Surgical Hx:    Past  Surgical History:   Procedure Laterality Date    ANKLE SURGERY Right     achilles x2       I personally reviewed and confirmed the ROS, past medical history, and past surgical history in the record with the patient today.    Active Meds:    No outpatient medications have been marked as taking for the 03/10/22 encounter (Office Visit) with Debarah Crape, MD.       Allergies:    Allergies as of 03/10/2022    (No Known Allergies)         OBJECTIVE:  Physical Exam  Vitals:  BP (!) 155/76    Pulse (!) 53   Temp 36.5 C (Temporal)   Resp 16   Ht '5\' 7"'$  (1.702 m)   Wt (!) 112.9 kg (249 lb)   SpO2 99%   BMI 39.00 kg/m    General: healthy, alert, no distress  Neck: supple. No adenopathy. Thyroid symmetric, normal size, without nodules  Heart: normal rate, regular rhythm and no murmurs, clicks, or gallops  Abd: soft, non-tender. BS normal. Umbilical hernia.   GU:  uncirc phallus, orthotopic meatus; no penile lesions; bilateral palpable/dependent testes no masses; no scrotal lesions    Labs, medical images, tracings, or specimens reviewed today with the patient include:  01/24/2022 Pathology, Surgical  Final Diagnosis   A) Prostate, right base, core biopsy:  - Prostatic adenocarcinoma.                            - Gleason score: 3+3=6                            - 0.5 cm total cancer of 1.9 cm total core length, involving 1 of 2 cores     B) Prostate, right mid, core biopsy:  - Prostatic adenocarcinoma.                            - Gleason score: 3+4=7                            - 1.6 cm total cancer of 1.9 cm total core length, involving 2 of 2 cores     C) Prostate, right apex, core biopsy:  - Prostatic adenocarcinoma.                            - Gleason score: 3+4=7                            - 1.7 cm total cancer of 2.0 cm total core length, involving 2 of 2 cores     D) Prostate, left base, core biopsy:  - Prostatic adenocarcinoma. See Comment                            - Gleason score: 3+4=7                            - 0.8 cm total cancer of 2.0 cm total core length, involving 2 of 2 cores     E) Prostate, left mid, core biopsy:  - Prostatic adenocarcinoma.                            -  Gleason score: 3+4=7                            - 1.0 cm total cancer of 1.6 cm total core length, involving 2 of 2 cores                            - Focally suspicious for extraprostatic extension     F) Prostate, left apex, core biopsy:  - Prostatic adenocarcinoma.                            - Gleason score:  4+3=7                            - 1.5 cm total cancer of 2.0 cm total core length, involving 2 of 2 cores     Summary findings:  Highest Gleason score: 4+3=7, WHO grade group 3                              Composite Gleason score: 3+4=7, WHO grade group 2               - Percentage of Pattern 4: 30%               - Cribriform pattern: present, small.     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     We began our discussion by reviewing his prostate cancer risk by NCCN risk groupings. We discussed further evaluation with CT scan, bone scan, or PSMA PET.  Given his high risk cancer (by PSA) and CT findings with sclerotic bone lesions, I recommended he obtain a PSMA PET. If the PSMA PET is negative, his options are radiation + ADT, surgery, or active surveillance. If the PSMA PET is positive, I would recommend hormone therapy (+/- radiation therapy) and consultation with a medical oncologist. He will see me in one month for follow up on the results of his PSMA PET.    I answered all of his questions and he has my direct contact information.    PLAN:  PET CT  RTC in one month    Thank you very much for referring Mr. Rommel Hogston to our clinic at the [facility].      03/10/2022 @ 1:37 PM - I, Charise Carwin acted as a Education administrator and documented the service/procedure performed to the best of my knowledge in the presence of Earney Mallet, MD who will provide the final review and authentication.  Signed: Charise Carwin

## 2022-03-10 ENCOUNTER — Ambulatory Visit: Payer: Medicare HMO | Attending: Urology | Admitting: Urology

## 2022-03-10 VITALS — BP 155/76 | HR 53 | Temp 97.7°F | Resp 16 | Ht 67.0 in | Wt 249.0 lb

## 2022-03-10 DIAGNOSIS — C61 Malignant neoplasm of prostate: Secondary | ICD-10-CM | POA: Insufficient documentation

## 2022-03-10 NOTE — Patient Instructions (Signed)
Please find some useful patient guides and network information below.    https://www.pcf.org/guide/prostate-cancer-patient-guide/  https://www.nccn.org/patients/guidelines/content/PDF/prostate-early-patient.pdf  https://www.zerocancer.org/  https://www.p3p4me.org/  https://www.[facility].org/diseases/prostate-cancer/treatment    Nathan Yiu [name], MD

## 2022-03-26 ENCOUNTER — Ambulatory Visit
Admit: 2022-03-26 | Discharge: 2022-03-26 | Disposition: A | Discharge status: Home / Self Care | Payer: Medicare HMO | Attending: Urology | Admitting: Urology

## 2022-03-26 DIAGNOSIS — C61 Malignant neoplasm of prostate: Secondary | ICD-10-CM | POA: Insufficient documentation

## 2022-03-26 MED ORDER — PIFLUFOLASTAT F-18 PSMA UWM INJECTION
9.0000 | Freq: Once | Status: AC | PRN
Start: 2022-03-26 — End: 2022-03-26
  Administered 2022-03-26: 9.75 via INTRAVENOUS

## 2022-03-31 ENCOUNTER — Ambulatory Visit (HOSPITAL_BASED_OUTPATIENT_CLINIC_OR_DEPARTMENT_OTHER): Payer: Medicare HMO | Admitting: Internal Medicine

## 2022-04-02 NOTE — Progress Notes (Unsigned)
UROLOGY CLINIC NOTE      CC/Reason for Visit:    Prostate cancer    History of Present Illness:    Alani Lacivita is a 71 year old male who returns to me for prostate cancer.    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  He was seen by Dr. Elizebeth Brooking on 01/24/2022 who performed a TRUS prostate biopsy.  He underwent further evaluation with CT AP on 02/14/2022 which demonstrated sclerotic lesions in the pelvis are concerning for prostate cancer metastases. PET CT on 03/26/2022 was negative.    Interval History  Today he is doing well with no complaints.     I personally reviewed and confirmed the ROS, past medical history, and past surgical history in the record with the patient today.    Active Meds:      Outpatient Medications Marked as Taking for the 04/07/22 encounter (Office Visit) with Debarah Crape, MD   Medication Sig Dispense Refill    Albuterol Sulfate HFA 108 (90 Base) MCG/ACT Inhalation Aero Soln Inhale 2 puffs by mouth every 6 hours as needed for shortness of breath/wheezing. 1 Inhaler 6    allopurinol 100 MG tablet Take 2 tablets (200 mg) by mouth daily. 180 tablet 0    atorvastatin 40 MG tablet Take 1 tablet (40 mg) by mouth every evening. To lower cholesterol. 90 tablet 0    blood glucose test strip Use 1 strip 4 times a day. Use to check blood sugar. 400 strip 4    glucometer (OneTouch Verio Flex System) w/Device kit Use to check blood sugar 4 times a day. 1 kit 6    glucose 4 g chewable tablet Chew and swallow 4 tablets (16 g) by mouth every 15 minutes as needed for low blood sugar. Recheck blood sugar as needed. 50 tablet 3    ibuprofen 400 MG tablet Take 1 tablet (400 mg) by mouth every 6 hours as needed for pain. 20 tablet  0    insulin NPH & REGULAR (HumuLIN 70/30 KwikPen) (70-30) 100 UNIT/ML pen-injector Inject 35 units under the skin every morning AND 20 units every evening. 75 mL 2    ketoconazole 2 % shampoo apply topically face and scalp if needed 120 mL 1    lancet (OneTouch Delica Plus) 84O Use 1 to check blood glucose 4 times a day. 400 each 2    metFORMIN 500 MG tablet Take 1 tablet (500 mg) by mouth 2 times a day with meals. 180 tablet 0    pen needle (TechLite Pen Needles) 31G X 8 MM Use to inject insulin 2 times a day. 200 each 2    Triamcinolone Acetonide 0.1 % External Ointment Apply to affected area on neck 2 times a day. 15 g 0       OBJECTIVE:  Physical Exam:    Vital Signs:  BP (!) 162/88   Pulse (!) 51   Temp 36.3 C (Temporal)   Resp 18   Ht '5\' 7"'  (1.702 m)   Wt (!) 111.8 kg (246 lb 6.4 oz)   SpO2 98%   BMI 38.59 kg/m    General: no distress      Results for orders placed or performed  during the hospital encounter of 02/14/22   POC Creatinine - [facility]   Result Value Ref Range    Creatinine (POC) 1.6 (H) 0.51 - 1.18 mg/dL    Spec Desc I_Stat (POC) CRE None     Performing Lab i_STAT (POC)       Test performed by: [facility], [place], [place] [address]     Labs reviewed:  PSA, Screening (ng/mL)   Date Value   01/09/2022 28.33 (H)   11/25/2021 23.06 (H)   07/08/2021 28.19 (H)      Imaging:  03/26/2022 PET CT     IMPRESSION  Markedly tracer avid prostate cancer involving peripheral zone from apex to bilateral mid and left base, possibly extending to the base of the left seminal vesicle. No PSMA PET/CT evidence of nodal, osseous or visceral metastases.       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    We discussed the findings of his PSMA PET which was negative. We discussed all the available treatments for localized prostate cancer including active surveillance, prostatectomy, brachytherapy, and external beam therapy.  Mr. Damontre Millea understands that he is not an ideal candidate for active surveillance given his high risk cancer.    We discussed the equivalence in outcomes of surgery and radiotherapy based on the currently available data.  For further discussion of the risk and benefits of radiotherapy, I offered the patient a referral to radiation oncology at our institution. He was interested and I placed a referral today.  He understands the major complications of external beam radiation would include transient proctitis and cystitis, that typically resolved in 6-12 months following radiation therapy.  The major complications of brachytherapy would include obstructive and irritative voiding symptoms.  Either procedure could also result in erectile dysfunction.  In his case, radiation therapy would be combined with one year of androgen deprivation therapy (ADT).  Side effects of include weight gain, low mood and energy, loss of libido, gynecomastia, and osteoporosis.     We discussed surgery. We discussed that given his age and weight, he may experience more complications following surgery.     All questions were answered with the patient and his family present.  He will notify our team when he has made a decision about treatment.     Referral to radiation oncology    04/07/2022 @ 3:33 PM - I, Charise Carwin acted as a Education administrator and documented the service/procedure performed to the best of my knowledge in the presence of Earney Mallet, MD who will provide the final review and authentication.  Signed: Charise Carwin

## 2022-04-03 ENCOUNTER — Telehealth (HOSPITAL_BASED_OUTPATIENT_CLINIC_OR_DEPARTMENT_OTHER): Payer: Self-pay | Admitting: Urology

## 2022-04-03 NOTE — Telephone Encounter (Signed)
Called Nathan [name] to let him know his PSMA PET did not show any metastatic cancer.  He is scheduled to see me on Monday 9/18 to review treatment.  All questions answered.    Earney Mallet, MD

## 2022-04-07 ENCOUNTER — Ambulatory Visit: Payer: Medicare HMO | Attending: Urology | Admitting: Urology

## 2022-04-07 VITALS — BP 162/88 | HR 51 | Temp 97.3°F | Resp 18 | Ht 67.0 in | Wt 246.4 lb

## 2022-04-07 DIAGNOSIS — C61 Malignant neoplasm of prostate: Secondary | ICD-10-CM | POA: Insufficient documentation

## 2022-04-14 ENCOUNTER — Ambulatory Visit (HOSPITAL_BASED_OUTPATIENT_CLINIC_OR_DEPARTMENT_OTHER): Payer: Medicare HMO | Admitting: Internal Medicine

## 2022-04-14 ENCOUNTER — Encounter (HOSPITAL_BASED_OUTPATIENT_CLINIC_OR_DEPARTMENT_OTHER): Payer: Self-pay

## 2022-04-14 ENCOUNTER — Telehealth (HOSPITAL_BASED_OUTPATIENT_CLINIC_OR_DEPARTMENT_OTHER): Payer: Self-pay | Admitting: Internal Medicine

## 2022-04-14 NOTE — Telephone Encounter (Signed)
RETURN CALL: Voicemail - General Message      SUBJECT:  Cancellation/Reschedule Request     APPOINTMENT DATE TO CANCEL: 09/25  REASON FOR CANCELLATION: Patient forgot about appt.  RESCHEDULE PREFERRED DATE/TIME: As soon as possible, before 10/04 for his [facility] appt.  ADDITIONAL INFORMATION: Patient is willing to see another provider if ok with clinic.

## 2022-04-15 ENCOUNTER — Ambulatory Visit (HOSPITAL_BASED_OUTPATIENT_CLINIC_OR_DEPARTMENT_OTHER): Payer: Medicare HMO

## 2022-04-15 NOTE — Telephone Encounter (Signed)
Called and left a message.

## 2022-04-17 NOTE — Progress Notes (Signed)
Mr. Pepitone did not cancel and was not present for a scheduled appointment today.  Disposition: Patient forgot about the appointment.  Will ask to have him rescheduled in 2 - 3 months.  Staff memo sent.

## 2022-04-22 NOTE — Consults (Signed)
Radiation Oncology Prostate Cancer Consultation Note    Diagnosis: Prostate adenocarcinoma    Stage: high-risk prostate cancer  Providers: Patient Care Team:  Nathan Byars, MD as PCP - General (Internal Medicine)  Nathan Bake, MD as Managed Care (Internal Medicine)  Nathan Roux, MD as Supervising Physician (Internal Medicine)  Nathan Crape, MD as Surgical Oncologist (Urology)  Nathan Sailors, MD as Radiation Oncologist (Radiation Oncology)  PCP: Nathan Genta, MD    CC:     Nathan [name] is a 71 year old year-old male with PMHx of Type 2 DM, HLD and newly diagnosed high risk prostate cancer (Gleason score 4+3=7, 11/12 cores positive, iPSA 28.33, prostate volume 36.6cc).  PSMA PET/CT showed localized disease with possible invasion into the Left SV base. He presents for initial consultation for consideration of radiation therapy.     HPI:     He underwent workup due to high PSA.     PSA, Diagnostic/Monitoring (ng/mL)   Date Value   01/09/2022 28.33 (H)     PSA, Screening (ng/mL)   Date Value   11/25/2021 23.06 (H)   07/08/2021 28.19 (H)     He was seen by Dr. Elizebeth [name] on 01/24/2022 who performed a TRUS prostate biopsy.  His prostate volume was estimated at 36.6 cc.       Pathology showed:  A) Prostate, right base, core biopsy:  - Prostatic adenocarcinoma.                            - Gleason score: 3+3=6                            - 0.5 cm total cancer of 1.9 cm total core length, involving 1 of 2 cores     B) Prostate, right mid, core biopsy:  - Prostatic adenocarcinoma.                            - Gleason score: 3+4=7                            - 1.6 cm total cancer of 1.9 cm total core length, involving 2 of 2 cores     C) Prostate, right apex, core biopsy:  - Prostatic adenocarcinoma.                            - Gleason score: 3+4=7                            - 1.7 cm total cancer of 2.0 cm total core length, involving 2 of 2 cores     D) Prostate, left base, core  biopsy:  - Prostatic adenocarcinoma. See Comment                            - Gleason score: 3+4=7                            - 0.8 cm total cancer of 2.0 cm total core length, involving 2 of 2 cores     E) Prostate, left mid, core biopsy:  - Prostatic adenocarcinoma.                            -  Gleason score: 3+4=7                            - 1.0 cm total cancer of 1.6 cm total core length, involving 2 of 2 cores                            - Focally suspicious for extraprostatic extension     F) Prostate, left apex, core biopsy:  - Prostatic adenocarcinoma.                            - Gleason score: 4+3=7                            - 1.5 cm total cancer of 2.0 cm total core length, involving 2 of 2 cores     Summary findings:  Highest Gleason score: 4+3=7, WHO grade group 3                              Composite Gleason score: 3+4=7, WHO grade group 2               - Percentage of Pattern 4: 30%               - Cribriform pattern: present, small.    CT A/P 02/14/22:    IMPRESSION  Sclerotic lesions in the pelvis are concerning for prostate cancer metastases given the clinical history.     No convincing evidence for intraperitoneal disease.    PSMA PET/CT 03/26/22:  IMPRESSION  Markedly tracer avid prostate cancer involving peripheral zone from apex to bilateral mid and left base, possibly extending to the base of the left seminal vesicle. No PSMA PET/CT evidence of nodal, osseous or visceral metastases.         He has seen Nathan [name] on 04/07/22 who referred the pt to [place].       Urinary function:          No data to display                 IPSS:  ***  QOL: ***  Urinary meds ***  He ***denies dysuria, hematuria, or incontinence.       Sexual function:           No data to display                 IIEF  ***  PDE5i? ***    Supplemental testosterone?    Bowel function: *** BM daily. Denies bleeding per rectum.     Last colonoscopy in ***. Advised to repeat in *** years.      Prior RT: ***He denies any history of prior RT.  He also denies having a pacemaker/defibrillator, inflammatory bowel disease, or autoimmune connective tissue disease.        PMH:    Past Medical History:   Diagnosis Date    Obesity, unspecified 11/24/2008    Unspecified asthma, with status asthmaticus 11/24/2008    Generalized osteoarthrosis, unspecified site 11/24/2008    Other and unspecified hyperlipidemia 40/97/3532    Urinary complications 99/24/2683    Diabetes mellitus ([place])     History of hyperlipidemia     Kidney disease     Myocardial infarction ([place])  Pulmonary embolism ([facility])        PSH:    Past Surgical History:   Procedure Laterality Date    ANKLE SURGERY Right     achilles x2       Social Hx:    He lives in *** with ***     He works ***    Family Hx:    ***    ROS: Negative except those detailed above.      PE:          General  Well-appearing well-nourished, appears stated age, in no acute distress.    HEENT  Normocephalic, atraumatic,anicteric. No eye/nose/ear discharge.     Neck  No gross lymphadenopathy. No goiter.    Lung  Normal respiratory effort, breathing comfortably on RA.    Abdomen   No apparent abdominal distention.    Extremities  No visible edema   Neuro  Moving all extremities spontaneously.     Psych  Attentive, conversant. Normal speech and affect.           Performance status: ECOG ***      Data Review:     Lab work, pathology report, and radiology reviewed in detail, as described above.    PSA, Diagnostic/Monitoring (ng/mL)   Date Value   01/09/2022 28.33 (H)     PSA, Screening (ng/mL)   Date Value   11/25/2021 23.06 (H)   07/08/2021 28.19 (H)     PET CT F-18 PSMA DCFPyL    Result Date: 03/27/2022  EXAMINATION: F-18 DCFPyL PET/CT IMAGING DATE OF STUDY: 90/60/2023 CLINICAL INDICATION: 71 year old male with newly diagnosed high risk prostate cancer, grade group 3. Imaging requested for initial staging. Initial treatment strategy.  COMPARISON: None TECHNIQUE: Extent: Skull vertex to proximal thighs.  At 60 minutes after injection of  F-18 DCFPyL intravenously, nondiagnostic noncontrast, nonbreathhold CT images were obtained for attenuation correction and fusion with emission PET images for anatomical localization. Images were interpreted on MIM. RADIOPHARMACEUTICAL: 9.75 mCi F-18 DCFPyL i.v. Injection site right antecubital fossa FINDINGS: PET numbers refer to PET image series 12 and PET/CT axial fused image series 1202 and CT numbers to series 3 in PACS. Prostate cancer findings: References for uptake intensity levels: *  No uptake: Less than blood pool intensity *  Mildly intense uptake: Greater than or equal to blood pool intensity and less than liver intensity *  Moderately intense uptake: Greater than or equal to liver intensity and less than salivary glands *  Markedly intense uptake: Equal to or greater than salivary gland intensity Prostate tumor characteristics: There is intense uptake in the posterior peripheral zone from apex to mid prostate and extending to the left base and the base of the left seminal vesicle with maximum SUV of 20.3. Location, extent, and distribution pattern of prostate cancer metastases: Lymph node regions: Internal iliac: No. External iliac: No. Common iliac: No. Obturator: No. Presacral: No. q Other pelvic Specify or delete: No. Retroperitoneal: No. Supradiaphragmatic: No. Other extrapelvic: No. Pattern of bone involvement: None Additional metastatic sites: None. Additional non-prostate cancer PET  and CT findings: Physiologic uptake is noted in the lacrimal and salivary glands, nasopharynx, vocal cords, gastrointestinal tract, spleen, liver, both kidneys, ureters and the urinary bladder, and testes. Symmetric linear uptake is seen in bilateral cavernous sinuses. This is of uncertain clinical significance and may be neural or vascular uptake. Symmetric mildly tracer avid nonenlarged lymph nodes in bilateral axillary and inguinal regions are likely reactive. Articular uptake at the left shoulder  represent  arthritis. A bulla in the apical right lung. Fat containing umbilical4 hernia.     Markedly tracer avid prostate cancer involving peripheral zone from apex to bilateral mid and left base, possibly extending to the base of the left seminal vesicle. No PSMA PET/CT evidence of nodal, osseous or visceral metastases.        Assessment/Plan:    Nathan [name] is a 71 year old year-old male with PMHx of Type 2 DM, HLD and newly diagnosed high risk prostate cancer (Gleason score 4+3=7, 11/12 cores positive, iPSA 28.33, prostate volume 36.6cc).  PSMA PET/CT showed localized disease with possible invasion into the Left SV base. He presents for initial consultation for consideration of radiation therapy.     Plan  -     T. Nathan Sailors, MD, PhD  Radiation Oncology

## 2022-04-23 ENCOUNTER — Ambulatory Visit
Admission: RE | Admit: 2022-04-23 | Discharge: 2022-04-23 | Disposition: A | Discharge status: Home / Self Care | Payer: Medicare HMO

## 2022-04-23 ENCOUNTER — Encounter (HOSPITAL_BASED_OUTPATIENT_CLINIC_OR_DEPARTMENT_OTHER): Payer: Medicare HMO

## 2022-04-23 VITALS — BP 170/95 | HR 55 | Temp 97.2°F | Resp 16 | Wt 249.9 lb

## 2022-04-23 DIAGNOSIS — E119 Type 2 diabetes mellitus without complications: Secondary | ICD-10-CM | POA: Insufficient documentation

## 2022-04-23 DIAGNOSIS — E785 Hyperlipidemia, unspecified: Secondary | ICD-10-CM | POA: Insufficient documentation

## 2022-04-23 DIAGNOSIS — C61 Malignant neoplasm of prostate: Secondary | ICD-10-CM | POA: Insufficient documentation

## 2022-04-23 MED ORDER — LEUPROLIDE ACETATE (3 MONTH) 22.5 MG IM KIT
22.5000 mg | PACK | Freq: Once | INTRAMUSCULAR | Status: DC
Start: 2022-04-30 — End: 2022-04-23

## 2022-04-23 NOTE — Progress Notes (Signed)
RADIATION TREATMENT PLANNING SIMULATION ORDER    Urgency: Routine (>7 Days)  Stage: cT3bN0M0  Primary Site: Prostate  Treatment Site: Prostate  Laterality: Not Applicable  ????Is this for a new course of treatment? Yes  Previous Treatment? None  Anesthesia: No  KPS: 90 - Able to carry on normal activity; minor signs or symptoms of disease  ECOG: 1 - Symptomatic but completely ambulatory  Treatment Intent: Definitive  ?Chemotherapy: Concurrent (Comment: ADT)  Radiation Type: Photons  Treatment Technique: SBRT  Single Sim: Yes  Total Dose: 40  # Fractions: 5  Dose / Fraction: 8  Boost: Yes  Does Boost add Fraction: No  ?Treatment Imaging: Daily  ?Guidance: CBCT  ????Simulation(s) will be performed: Yes  Simulation Type: CT-guided  ???Slice Thickness: 7.82 mm  ?Body Position: Supine  ?Scan Position: Head First  Head Position: Neutral  ?Arm Position: On Chest  ?Leg Position: Feet Together / Straight  ??Setup Devices: Vac Lock  ??Special Considerations: Fiducial Markers Present; Full Bladder  ???Physics Weekly Chart Check [phone]): Yes  Special Physics Consult [phone]): SBRT  ?Special Treatment Procedure [phone]): SRS / SBRT  ?Imaging Studies: CT; MRI  Image Fusion Needed: Yes  Images: same day MRI, axial T2  ????Treatment Scheduling: Film and Treat  ??Treatment Sequence: Every other day  ?Attending MD presence required at treatment machine: No  ????Special Treatment Instructions: Full ballder and empty rectum during sim and each treatment.  ????????????????????????????????????????????????????????????        No specialty comments available.

## 2022-04-24 ENCOUNTER — Other Ambulatory Visit (HOSPITAL_BASED_OUTPATIENT_CLINIC_OR_DEPARTMENT_OTHER): Payer: Self-pay

## 2022-04-24 ENCOUNTER — Telehealth (HOSPITAL_BASED_OUTPATIENT_CLINIC_OR_DEPARTMENT_OTHER): Payer: Self-pay | Admitting: Pharmacy

## 2022-04-24 DIAGNOSIS — C61 Malignant neoplasm of prostate: Secondary | ICD-10-CM

## 2022-04-24 NOTE — Telephone Encounter (Signed)
RETURN CALL: Voicemail - Detailed Message      SUBJECT:  Medication Questions     NAME OF MEDICATION(S): insulin  ADDITIONAL INFORMATION: Patient requesting call from Dr. Franchot Erichsen to talk about alternative to insulin

## 2022-04-24 NOTE — Telephone Encounter (Signed)
Called pt Nathan [name]: insulin questions.    Pt reports he was told by someone that they were going to stop his insulin and switch to something else. Was seen by Dr. Franchot Erichsen on 11/25/21 where no medication changes were made. His A1c has been stable and at good control for about a year.     Hemoglobin A1C   Date Value Ref Range Status   11/25/2021 6.1 (H) 4.0 - 6.0 % Final   05/20/2021 6.1 (H) 4.0 - 6.0 % Final   11/04/2020 17.5 (H) 4.0 - 6.0 % Final     Comment:     Assay correlated with other suppporting data or repeated for quality assurance   purposes.       Pt shares that he starts radiation oncology in a few weeks. Was seen by them for an initial visit yesterday. States he chose every other day treatment. Worried that his rescheduled appt with Dr. Franchot Erichsen on 06/09/22 would fall on a treatment day.    ASSESSMENT/PLAN:  #Diabetes: well-controlled, A1c <7% for the past year. On metformin and insulin. It looks like there was discussion about maybe adding on GLP1RA and decreasing his insulin dose. However, given his upcoming treatment for prostate CA would not recommend changing his diabetes plan at this time.     --Will continue his current DM regimen at this time. Next f/u scheduled 11/20 with Dr. Franchot Erichsen.     --Pending rad/onc appointments. If the tx is on same day as PCP visit, will reschedule    Maud Deed. Rona Ravens, PharmD, [place]  Clinical Pharmacist, [facility]  Phone: [phone]  EpicCare Pool: p Rob[place]

## 2022-05-06 ENCOUNTER — Ambulatory Visit
Admission: RE | Admit: 2022-05-06 | Discharge: 2022-05-06 | Disposition: A | Discharge status: Home / Self Care | Payer: Medicare HMO

## 2022-05-06 DIAGNOSIS — C61 Malignant neoplasm of prostate: Secondary | ICD-10-CM

## 2022-05-06 NOTE — Progress Notes (Signed)
Nathan [name] called @ [phone]  and given instructions for Fiducial/SpaceOAR placement.    Patient is not taking any blood thinning medications.  Current Outpatient Medications   Medication Instructions    acetaminophen (TYLENOL) 1,000 mg, Oral, Every 6 hours PRN    Albuterol Sulfate HFA 108 (90 Base) MCG/ACT Inhalation Aero Soln 2 puffs, Inhalation, Every 6 hours PRN    allopurinol (ZYLOPRIM) 200 mg, Oral, Daily    atorvastatin 40 MG tablet Take 1 tablet (40 mg) by mouth every evening. To lower cholesterol.    blood glucose test strip 1 strip, Does not apply, 4 times daily, Use to check blood sugar.    ciprofloxacin (CIPRO) 500 mg, Oral, 2 times daily, Continue taking until it is gone.    Fluticasone Propionate HFA 110 MCG/ACT Inhalation Aerosol 1 puff, Inhalation, Every 12 hours    glucometer (OneTouch Verio Flex System) w/Device kit Use to check blood sugar 4 times a day.    glucose 16 g, Oral, Every 15 min PRN, Recheck blood sugar as needed.    ibuprofen (MOTRIN) 400 mg, Oral, Every 6 hours PRN    insulin NPH & REGULAR (HumuLIN 70/30 KwikPen) (70-30) 100 UNIT/ML pen-injector Inject 35 units under the skin every morning AND 20 units every evening.    ketoconazole 2 % shampoo apply topically face and scalp if needed    lancet (OneTouch Delica Plus) 33g Use 1 to check blood glucose 4 times a day.    metFORMIN (GLUCOPHAGE) 500 mg, Oral, 2 times daily with meals    pen needle (TechLite Pen Needles) 31G X 8 MM Use to inject insulin 2 times a day.    Triamcinolone Acetonide 0.1 % External Ointment Neck, 2 times daily        Instructed patient to pick up prescription for antibiotics and sedative and bring them to their appointments. Pharmacy confirmed:    [id number] [address] [place] [phone]   [address]  [place] [address]  Phone: [phone] Fax: [phone]    Patient will also pick up 2 fleets enemas and take one the night  before the procedure and one 2-3 hours before the procedure.    Confirmed patient has a driver.    Nathan [name] had no further questions.

## 2022-05-13 ENCOUNTER — Ambulatory Visit (HOSPITAL_BASED_OUTPATIENT_CLINIC_OR_DEPARTMENT_OTHER)
Admit: 2022-05-13 | Discharge: 2022-05-13 | Disposition: A | Discharge status: Home / Self Care | Payer: Medicare HMO | Source: Home / Self Care

## 2022-05-13 ENCOUNTER — Ambulatory Visit
Admit: 2022-05-13 | Discharge: 2022-05-13 | Disposition: A | Discharge status: Home / Self Care | Payer: Medicare HMO | Attending: Unknown Physician Specialty | Admitting: Unknown Physician Specialty

## 2022-05-13 ENCOUNTER — Other Ambulatory Visit (HOSPITAL_BASED_OUTPATIENT_CLINIC_OR_DEPARTMENT_OTHER): Payer: Self-pay

## 2022-05-13 ENCOUNTER — Ambulatory Visit (HOSPITAL_BASED_OUTPATIENT_CLINIC_OR_DEPARTMENT_OTHER)
Admit: 2022-05-13 | Discharge: 2022-05-13 | Disposition: A | Discharge status: Home / Self Care | Payer: Medicare HMO | Source: Home / Self Care | Admitting: Unknown Physician Specialty

## 2022-05-13 DIAGNOSIS — C61 Malignant neoplasm of prostate: Secondary | ICD-10-CM | POA: Insufficient documentation

## 2022-05-13 NOTE — Progress Notes (Signed)
Patient here for Fiducial and SpaceOAR procedure.  Identification was verified and armband placed on patient.  Discharge instructions reviewed and patient was given a copy.  Bowel prep completed.  Timeout was done by Dr. Luisa Dago prior to the start of the procedure.  Patient tolerated procedure well.  Prior to discharge pain well managed and patient able to void.  Voiced no questions or concerns.  Left with caregiver at 1330.

## 2022-05-13 NOTE — Addendum Note (Signed)
Encounter addended by: Kennedie Pardoe, [place] on: 05/13/2022 4:58 PM   Actions taken: Charge Capture section accepted

## 2022-05-13 NOTE — Progress Notes (Signed)
Ir[facility] Shows  Diagnosis: prostate cancer  Stage: high risk  Other MDs: Dr. 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  PSMA PET/CT showed localized disease with possible invasion into the Left SV base.    He presents today for placement of fiducial markers.      Interval history:   He did bowel prep, confirmed.      Assessment/Plan:    I reviewed the procedure with Mr. Pfannenstiel, who elected to proceed and signed informed consent.     Particia Lather, MD   Radiation Oncology

## 2022-05-13 NOTE — Progress Notes (Signed)
PREOPERATIVE DIAGNOSIS   Adenocarcinoma of the prostate.    PROCEDURE PERFORMED  Transrectal ultrasound  Transrectal ultrasound guidance  Transperineal implantation of gold prostate fiducials for radiotherapy localization    SURGEON  Particia Lather, MD, Attending      INDICATIONS  [name] is a 71 year old gentleman with a T3a/b, Gleason 4+3, PSA 28 adenocarcinoma of the prostate.  After consideration of treatment options, the patient elected to undergo external beam radiotherapy.  He presents at this time for prostate fiducial placement to guide the radiotherapy.    FINDINGS:  The seminal vesicles appeared normal.  There were no hypoechoic peripheral zones noted within the prostate.     TECHNIQUE  The patient was taken to the examination room.  The patient confirmed his identification in my presence.  The patient was placed in the lithotomy position.  The biplane ultrasound transducer was introduced into the rectum.  The prostate was aligned scanned in the axial and sagital planes.  A perineal and prostate apex block was performed with 20cc of 1% lidocaine.  Under ultrasonic guidance, 3 gold fiducials were placed transperineally at the right prostate base, the left lateral prostate, and the right prostate apex.  The needles were advanced under ultrasound guidance.  The stylet was then advanced until the fiducial was at the needle tip. The needle was then pulled back over the stylet to release the fiducial.      The patient tolerated the procedure well.  He will return on 05/26/2022 for radiation simulation at [facility] with Dr. Gaylyn Cheers. He knows to contact the office with any questions or concerns in the interim.

## 2022-05-21 ENCOUNTER — Ambulatory Visit (HOSPITAL_BASED_OUTPATIENT_CLINIC_OR_DEPARTMENT_OTHER): Payer: Medicare HMO

## 2022-05-21 ENCOUNTER — Other Ambulatory Visit (HOSPITAL_BASED_OUTPATIENT_CLINIC_OR_DEPARTMENT_OTHER): Payer: Self-pay

## 2022-05-23 ENCOUNTER — Other Ambulatory Visit (HOSPITAL_BASED_OUTPATIENT_CLINIC_OR_DEPARTMENT_OTHER): Payer: Self-pay

## 2022-05-23 DIAGNOSIS — C801 Malignant (primary) neoplasm, unspecified: Secondary | ICD-10-CM

## 2022-05-26 ENCOUNTER — Ambulatory Visit (HOSPITAL_BASED_OUTPATIENT_CLINIC_OR_DEPARTMENT_OTHER)
Admit: 2022-05-26 | Discharge: 2022-05-26 | Disposition: A | Discharge status: Home / Self Care | Payer: Medicare HMO | Source: Home / Self Care

## 2022-05-26 ENCOUNTER — Ambulatory Visit (HOSPITAL_BASED_OUTPATIENT_CLINIC_OR_DEPARTMENT_OTHER): Payer: Medicare HMO

## 2022-05-26 ENCOUNTER — Ambulatory Visit
Admit: 2022-05-26 | Discharge: 2022-05-26 | Disposition: A | Discharge status: Home / Self Care | Payer: Medicare HMO | Attending: Diagnostic Radiology | Admitting: Diagnostic Radiology

## 2022-05-26 DIAGNOSIS — C61 Malignant neoplasm of prostate: Secondary | ICD-10-CM

## 2022-05-26 DIAGNOSIS — Z5111 Encounter for antineoplastic chemotherapy: Secondary | ICD-10-CM | POA: Insufficient documentation

## 2022-05-26 DIAGNOSIS — Z510 Encounter for antineoplastic radiation therapy: Secondary | ICD-10-CM | POA: Insufficient documentation

## 2022-05-26 DIAGNOSIS — R0602 Shortness of breath: Secondary | ICD-10-CM | POA: Insufficient documentation

## 2022-05-26 DIAGNOSIS — R5383 Other fatigue: Secondary | ICD-10-CM | POA: Insufficient documentation

## 2022-05-26 DIAGNOSIS — E1165 Type 2 diabetes mellitus with hyperglycemia: Secondary | ICD-10-CM

## 2022-05-26 DIAGNOSIS — C801 Malignant (primary) neoplasm, unspecified: Secondary | ICD-10-CM

## 2022-05-26 LAB — POC CREATININ, WHOLE BLOOD FHCC MANUAL: FHCC CREATININE BY I STAT (POC): 1.5 mg/dL — AB (ref 0.51–1.18)

## 2022-05-26 MED ORDER — HYOSCYAMINE SULFATE 0.125 MG SL SUBL
0.5000 mg | SUBLINGUAL_TABLET | Freq: Once | SUBLINGUAL | Status: DC | PRN
Start: 2022-05-26 — End: 2022-05-27

## 2022-05-26 MED ORDER — LEUPROLIDE ACETATE (3 MONTH) 22.5 MG IM KIT
22.5000 mg | PACK | Freq: Once | INTRAMUSCULAR | Status: AC
Start: 2022-05-26 — End: 2022-05-26
  Administered 2022-05-26: 22.5 mg via INTRAMUSCULAR
  Filled 2022-05-26: qty 1

## 2022-05-26 MED ORDER — GADOTERIDOL 279.3 MG/ML IV SOLN
20.0000 mL | Freq: Once | INTRAVENOUS | Status: AC
Start: 2022-05-26 — End: 2022-05-26
  Administered 2022-05-26: 10 mmol via INTRAVENOUS

## 2022-05-26 NOTE — Progress Notes (Addendum)
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  PSMA PET/CT showed localized disease with possible invasion into the Left SV base.       Plan is for SBRT in 5 fractions to the prostate, SV and pelvic lymph nodes with concurrent ADT. We will plan to initiate treatment in 3 weeks given the upcoming Thanksgiving holiday.       INTERVAL HISTORY: [name] returns for a consent and CT simulation visit.      The indications and relative pros and cons of external beam radiation therapy were discussed with [name].  We discussed the potential acute complications of radiation therapy, which includes but are not limited to fatigue,?skin irritation,?urinary frequency and urgency, urinary retention, increase of any baseline incontinence, burning with urination, loose stool, and urgency with bowel movements. I discussed the potential long-term toxicities, including but not limited to urinary frequency, urgency, urethral stricture requiring dilation or surgical procedure, proctitis,?cystitis,?telangiectasia, bleeding, bowel injury such as obstruction or fistula, sexual dysfunction including anejaculation and erectile dysfunction, and a small chance of secondary malignancy. I also reviewed bladder filling and rectal emptying protocols. We also discussed that, even with appropriate therapy, there is still a risk of  recurrence of his cancer.  After his questions were answered, he appeared to understand this information, agreed to proceed with the treatment, and signed consent today.      More than 50% of our visit time (approximately 15 minutes) was spent counseling the patient on the above issues.    Tilda Burrow, MD, PhD  Radiation Oncology

## 2022-05-26 NOTE — Progress Notes (Signed)
Oncology Infusion Nurse Note    Subjective / Visit Information:    Reason for Visit: OP Infusion      Objective:   There were no vitals filed for this visit.    Medication Administrations This Visit         leuprolide (3 month) (Lupron Depot) injection 22.5 mg Admin Date  05/26/2022  12:49 Action  Given Dose  22.5 mg Route  Intramuscular Site  Right Ventrogluteal Documented By  Esmeralda Arthur, RN    Ordering Provider: Brooks Sailors, MD    NDC: [phone]            Assessment and Plan:              Summary:   Patient tolerated injection. Discharged to radiation in stable condition.      Additional documentation may exist in Flowsheets or Education Activity

## 2022-05-27 ENCOUNTER — Other Ambulatory Visit (HOSPITAL_BASED_OUTPATIENT_CLINIC_OR_DEPARTMENT_OTHER): Payer: Self-pay

## 2022-05-27 DIAGNOSIS — C61 Malignant neoplasm of prostate: Secondary | ICD-10-CM

## 2022-06-05 ENCOUNTER — Telehealth (HOSPITAL_BASED_OUTPATIENT_CLINIC_OR_DEPARTMENT_OTHER): Payer: Self-pay | Admitting: Internal Medicine

## 2022-06-05 NOTE — Telephone Encounter (Signed)
RETURN CALL: Voicemail - Detailed Message      SUBJECT:  Refill Request     NAME OF MEDICATION(S): Eloquis  DATE NEEDED BY: ASAP    PRESCRIBING PROVIDER: N/A    PHARMACY NAME/LOCATION: [facility]  [place], [place] [address]  ~9.2 mi  [phone]    ADDITIONAL INFORMATION: The patient is unsure of the dose and I was unable to find the medication on his list

## 2022-06-08 NOTE — Progress Notes (Signed)
Adult Medicine Clinic - Outpatient Return Clinic Note    Date:  06/09/2022    Nathan [name] is a 71 year old male who is here today for   Chief Complaint   Patient presents with    Diabetes     INTERVAL HISTORY/HPI:    Nathan [name] is a 71 year old man hospitalized 4/14 - 11/05/20 for syncope, PE, NSTEMI, Hyoxic resp failure, HHS, Newly diagnosed DM,  Asthma, AKI/CKD, and gout.     [name] returns to clinic for follow-up.  During the visit he had several requests.  Initially he indicated that his prescription for apixaban had expired and he asked for refill.  When I attempted to confirm his dose he expressed that he was taking 1 pill each day which did not appear to be consistent with standard dosing.  When recommended to take the medication twice daily he insisted that he preferred to take 1 pill each day.    For confirmation I reviewed the discharge summary on 11/05/2020, where it was noted that he was diagnosed with a large pulmonary arterial embolism involving all lobar arteries of the bilateral lungs resulting in acute respiratory failure.  He was initiated on apixaban 10 mg twice daily and then transition to 5 mg twice daily thereafter.  It was recommended that he maintain on anticoagulation for a minimum of 6 months subject to future determination on discontinuation.  Recommendations for cancer screening, hypercoagulable workup and consideration of echo for right heart strain were included.    During his admission he was also diagnosed with hyperosmolar hyperglycemic state (HHS) with blood sugar levels in the 800s.  His hemoglobin A1c had risen to 17.1% above a prior baseline of 7.5%.  After treatment and stabilization he was discharged on 70/30 insulin now on 35 units and 25 units morning and evening respectively, and metformin 500 mg twice daily.  His hemoglobin A1c levels have returned to prior baselines and he is well-controlled.  During the visit he inquires about a medicine that he can take orally once  per week.  I indicated that I believe once a week formulations would be injected subcutaneous and he stated he was sure that he was told there was an oral preparation.  I indicated that I would review his overall portfolio and discuss options with him.  He strongly wishes to avoid ongoing injection therapy.  He also requests a follow-up with Nutrition.      Nathan [name] is undergoing active treatment for prostate cancer.  His biopsy from 01/24/2022 demonstrated multiple sites of Gleason score 3+4=7.  He indicated he was given a choice of surgery, radiation therapy or chemotherapy.  He elected for radiation therapy followed by antiandrogen therapy.  He reports he is tolerating this well.    Chart Review:     09/24/21:  DM note:  Assessment: Plan   Diabetes: fasting and prandial  glucose well controlled.  Less hypoglycemia on current insulin dose.  Has glucose tabs on hand.   BMI 40.9  Nathan [name] is interested in trying  weekly GLP1 RA  Call to [facility] to determine coverage.  Both trulicity and ozempic are covered.  Discuss with Dr. Franchot Erichsen and coordinate follow up with [name].  Can change from NPH/Reg to NPH BID or  Reduce mix insulin by 25%  Goal : reduce risk for hypoglycemia and maintain glycemic control.  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 Mixed type cataracts, both eyes  - mild visual significance left eye  - monitor   3.    Presbyopia  - Discussed with patient the need for reading glasses as he ages.  He understands and is happy to try reading glasses  - recommend +2.00 to +2.50 OTC readers"       Allergies: Grass and cherry blossoms. He reports they have been kicking in late November. Not allergic to ASA       SH:  He has been staying at home.  He lives at home  alone, likes to fish in the warmer months, he states he believes in [place], and lives on a fixed income.  He likes to "speak when spoken to".           PROBLEM LIST: See Epic Problem List    Patient Active Problem List   Diagnosis    Obesity, unspecified    Generalized osteoarthrosis, unspecified site    Other hyperlipidemia    Presbyopia    Gout    Irregular heart beat    Achilles tendinosis of right lower extremity    Mild intermittent asthma without complication    Healthcare maintenance    Non-seasonal allergic rhinitis    Subacute massive pulmonary embolism (HCC)    Type 2 diabetes mellitus with hyperglycemia, with long-term current use of insulin (HCC)    Syncope    PSA elevation    Wellness examination    Prostate cancer [facility])       MEDICATIONS:    Current Outpatient Medications:     acetaminophen 500 MG tablet, Take 2 tablets (1,000 mg) by mouth every 6 hours as needed for pain. (Patient not taking: Reported on 04/07/2022), Disp: 20 tablet, Rfl: 0    Albuterol Sulfate HFA 108 (90 Base) MCG/ACT Inhalation Aero Soln, Inhale 2 puffs by mouth every 6 hours as needed for shortness of breath/wheezing., Disp: 1 Inhaler, Rfl: 6    allopurinol 100 MG tablet, Take 2 tablets (200 mg) by mouth daily., Disp: 180 tablet, Rfl: 0    atorvastatin 40 MG tablet, Take 1 tablet (40 mg) by mouth every evening. To lower cholesterol., Disp: 90 tablet, Rfl: 0    blood glucose test strip, Use 1 strip 4 times a day. Use to check blood sugar., Disp: 400 strip, Rfl: 4    ciprofloxacin 500 MG tablet, Take 1 tablet (500 mg) by mouth 2 times a day. Continue taking until it is gone., Disp: 3 tablet, Rfl: 0    Fluticasone Propionate HFA 110 MCG/ACT Inhalation Aerosol, Inhale 1 puff by mouth every 12 hours., Disp: 1 Inhaler, Rfl: 6    glucometer (OneTouch Verio Flex System) w/Device kit, Use to check blood sugar 4 times a day., Disp: 1 kit, Rfl: 6    glucose 4 g chewable tablet, Chew and swallow 4 tablets (16 g) by mouth every 15 minutes as  needed for low blood sugar. Recheck blood sugar as needed., Disp: 50 tablet, Rfl: 3    ibuprofen 400 MG tablet, Take 1 tablet (400 mg) by mouth every 6 hours as needed for pain., Disp: 20 tablet, Rfl: 0    insulin NPH & REGULAR (HumuLIN 70/30 KwikPen) (70-30) 100 UNIT/ML pen-injector, Inject 35 units under the skin every morning AND 20 units every  evening., Disp: 75 mL, Rfl: 2    ketoconazole 2 % shampoo, apply topically face and scalp if needed, Disp: 120 mL, Rfl: 1    lancet (OneTouch Delica Plus) 33g, Use 1 to check blood glucose 4 times a day., Disp: 400 each, Rfl: 2    metFORMIN 500 MG tablet, Take 1 tablet (500 mg) by mouth 2 times a day with meals., Disp: 180 tablet, Rfl: 0    pen needle (TechLite Pen Needles) 31G X 8 MM, Use to inject insulin 2 times a day., Disp: 200 each, Rfl: 2    Triamcinolone Acetonide 0.1 % External Ointment, Apply to affected area on neck 2 times a day., Disp: 15 g, Rfl: 0    ALLERGIES:  Review of patient's allergies indicates:  No Known Allergies    PHYSICAL EXAM:    BP (!) 145/72   Pulse 60   Temp 36 C (Temporal)   Resp 16   Ht 5' 7" (1.702 m)   Wt (!) 112.5 kg (248 lb)   SpO2 97% Comment: at RA, resting  BMI 38.84 kg/m     NAD  Alert and interactive  Exam otherwise deferred    Foot exam:  (11/25/21)   - Visual inspection:  normal to inspection, with intact skin, no significant callus formation, no evidence of ischemia.    - Monofilament exam:  Intact    - Pulse exam: DP's symmetric.     LAB AND IMAGING RESULTS:      Orders Only on 07/10/21   1. Occult Blood by Immunoassay, Stool   Result Value Ref Range     Occult Bld 1 Result Negative NRN      08/06/21: TTE:  Conclusion:   Normal LV size with mildly reduced systolic function. Regional wall motion  abnormalities shown below.  Normal RV size and systolic function. Normal RA pressure. Could not evaluate  PA pressure. RV/LV ratio 0.7 which is normal.  Normal valve structure and function.      ASSESSMENT AND PLAN:    Diabetes:   Humulin 70/30 and Metformin.  Update labs, consider non-injection therapy.   Copy to Linda Castine.   Foot Exam - 11/26/20, 07/04/21, 11/25/21 completed.   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  Mixed type cataracts, both eyes, mild. Monitor. Referrral for annual eye exam placed for August 2023 and again in Nov 2023.   Alb/Creat = 7 (11/25/21)      A1C [facility] AMB    A1C   Latest Ref Rng 4.0 - 6.0 %   11/01/2020 17.1 (H)    11/04/2020 17.5 (H)    05/20/2021 6.1 (H)    11/25/2021 6.1 (H)         2. 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  He has been treated with Apixaban 5 mg bid, however, he insists he was intended to be treated with 5 mg qd, which he has been taking. This is refilled.   At this time I will work with Anticoagulation program to consider duration of therapy.  (Provoked vs non-provoked).     3. Asthma.  He uses -Fluticasone 110 mcg BID  -Albuterol Q6hr PRN and has a stable pattern.  He reports that he has never called 911 or been hospitalized for Asthma.  No documentation of PFTs at [facility] or Care Everywhere.  PEF on exam today 300 lpm, no wheezing on   exam.  Will refill meds and refer for PFTs.  (PFTs not yet obtained)      4. Cardiac Risk Factors.  BP 124/62 off meds, BMI 47, LDL 96, DM no, No FH CAD, No tobacco. No MI or Stroke.  EKG (11/01/21): NSR, Axis - 52 (LAD), PR N, QRS 168, QTc 459, RBBB, LAHB, No Q-waves.  ASCVD Risk 9.8% Atorvastatin 40 MG. ASA has been stopped.  Repeat lipids.         Cholesterol    LDL Cholesterol   Latest Ref Rng <130 mg/dL   11/07/2015 96    07/11/2019 89    11/01/2020 107    05/20/2021 62       5. CKD (Grade 3a).  Stable pattern over time.   Creatinine 1.60 - 1.45 mg/dL ()  GFR 44 - 52 ml/min (05/20/21)  Prot / Creat < 0.1 (02/22/16)   Alb/Creat 7 (05/20/21)     6. Chronic gout.  He report intermittent foot pain but is non-specific on interview.   Records indicate last flare podagra (L great toe) in June 2017.  He is minimizing red meat, and no alcohol.  He has CKD therefore goal is to use tylenol over NSAIDs for pain.  He is on Allopurinol 200 mg daily, Colchicine 0.6 mg 2 tabs for flare, then 1 tab 1 hr later.  Uric Acid 7.4 (07/07/18).  CrCl at 50 mL/min, allopurinol and colchicine doses acceptable.  ASA has been stopped due to gout and CKD.  (stable 11/26/20)     7. Tinea Capitus.  Ketoconazole shampoo      8.  Social:  He has no DPOA.  But his brother Mark who lives in [place] calls and checks on him the most.  He would want Mark involved if her were unable to make decisions for himself.         HEALTH CARE MAINTANENCE / SCREENING:  (See below)      PHQ 2 Screening: p     Colorectal Cancer Screening:  FIT negative (07/23/18 and 07/10/21). Patient declines colonoscopy.  2017 - 07/10/21: FIT Negative x 5.     Tobacco Use:  Quit at age 20.  ETOH Use:  Occ use   Marijuana:  Occ      Hepatitis C / HIV Screening:  HIV negative 2013.  Hep C (1945-65 + high risk): Neg 2013       Prostate Cancer Screening:  No FH of prostate CA.   He elects to obtain a PSA after SDM.  (07/04/21)  PSA 28.19 (referral sent on 07/10/21, patients questions were addressed but closed.)  11/25/21:  Again SDM today with patient agreement to proceed with referral to Urology.  He is undergoing therapy for prostate cancer.         He declines a flu vaccine. Bivalent COVID booster. (07/04/21).       Declines COVID and Flu.  RSV at the local pharmacy.  He declines all today.  (06/09/22).    Immunization History   Administered Date(s) Administered    COVID-19 Moderna mRNA monovalent 12 yrs and older 09/20/2019, 10/18/2019    COVID-19 Pfizer mRNA bivalent 12 yrs and older 07/04/2021    COVID-19 Pfizer mRNA monovalent tris-sucrose 12 yrs and older (gray cap) 11/26/2020    Hepatitis B, unspecified 10/05/2007, 11/05/2007, 05/08/2008    Influenza quadrivalent PF 04/19/2013, 06/12/2014, 05/01/2015     Influenza quadrivalent adjuvanted (Fluad) 05/19/2019    Influenza trivalent PF 07/07/2012    Influenza, unspecified   09/26/2010    Pneumococcal polysaccharide PPSV23 (Pneumovax 23) 02/12/2009    Tdap 01/21/2011     Future Appointments   Date Time Provider [place]   06/09/2022  4:30 PM [name], MD H [facility] AM   06/16/2022  2:00 PM [facility] RAD THERAPY RN [facility] RADIATI   06/16/2022  2:30 PM Weg, Rushie Nyhan, MD [facility] RADIATI   06/18/2022 12:00 PM Colan Neptune, MD [facility] RADIATI   06/20/2022 12:00 PM Brooks Sailors, MD [facility] RADIATI   06/20/2022  1:00 PM Brooks Sailors, MD [facility] RADIATI   06/23/2022 12:00 PM [place] VERSA F[facility]T [place] RADIATI   06/25/2022 12:00 PM Lowella Dell, MD [facility] RADIATI   06/30/2022  1:30 PM [name], MD H Adlt20 [facility] AM   09/22/2022  2:00 PM [name], MD H Adlt[facility] AM     Total time spent in the direct care of this patient and in reviewing records and documentation on the date of service was 45 minutes.

## 2022-06-09 ENCOUNTER — Ambulatory Visit: Payer: Medicare HMO | Attending: Internal Medicine | Admitting: Internal Medicine

## 2022-06-09 ENCOUNTER — Other Ambulatory Visit (HOSPITAL_BASED_OUTPATIENT_CLINIC_OR_DEPARTMENT_OTHER): Payer: Self-pay | Admitting: Internal Medicine

## 2022-06-09 VITALS — BP 145/72 | HR 60 | Temp 96.8°F | Resp 16 | Ht 67.0 in | Wt 248.0 lb

## 2022-06-09 DIAGNOSIS — E7849 Other hyperlipidemia: Secondary | ICD-10-CM | POA: Insufficient documentation

## 2022-06-09 DIAGNOSIS — E1165 Type 2 diabetes mellitus with hyperglycemia: Secondary | ICD-10-CM | POA: Insufficient documentation

## 2022-06-09 DIAGNOSIS — Z6841 Body Mass Index (BMI) 40.0 and over, adult: Secondary | ICD-10-CM | POA: Insufficient documentation

## 2022-06-09 DIAGNOSIS — J4520 Mild intermittent asthma, uncomplicated: Secondary | ICD-10-CM | POA: Insufficient documentation

## 2022-06-09 DIAGNOSIS — C61 Malignant neoplasm of prostate: Secondary | ICD-10-CM | POA: Insufficient documentation

## 2022-06-09 DIAGNOSIS — I2699 Other pulmonary embolism without acute cor pulmonale: Secondary | ICD-10-CM | POA: Insufficient documentation

## 2022-06-09 DIAGNOSIS — Z794 Long term (current) use of insulin: Secondary | ICD-10-CM | POA: Insufficient documentation

## 2022-06-09 DIAGNOSIS — Z0000 Encounter for general adult medical examination without abnormal findings: Secondary | ICD-10-CM | POA: Insufficient documentation

## 2022-06-09 LAB — LIPID PANEL
Cholesterol/HDL Ratio: 3.2
HDL Cholesterol: 46 mg/dL (ref >39)
LDL Cholesterol, NIH Equation: 74 mg/dL (ref <130)
Non-HDL Cholesterol: 100 mg/dL (ref 0–159)
Total Cholesterol: 146 mg/dL (ref <200)
Triglyceride: 151 mg/dL — ABNORMAL HIGH (ref <150)

## 2022-06-09 LAB — COMPREHENSIVE METABOLIC PANEL
ALT (GPT): 26 U/L (ref 10–48)
AST (GOT): 21 U/L (ref 9–38)
Albumin: 4.1 g/dL (ref 3.5–5.2)
Alkaline Phosphatase (Total): 83 U/L (ref 36–161)
Anion Gap: 6 (ref 4–12)
Bilirubin (Total): 0.7 mg/dL (ref 0.2–1.3)
Calcium: 9.3 mg/dL (ref 8.9–10.2)
Carbon Dioxide, Total: 32 meq/L (ref 22–32)
Chloride: 104 meq/L (ref 98–108)
Creatinine: 1.57 mg/dL — ABNORMAL HIGH (ref 0.51–1.18)
Glucose: 101 mg/dL (ref 62–125)
Potassium: 4.5 meq/L (ref 3.6–5.2)
Protein (Total): 7.4 g/dL (ref 6.0–8.2)
Sodium: 142 meq/L (ref 135–145)
Urea Nitrogen: 22 mg/dL — ABNORMAL HIGH (ref 8–21)
eGFR by CKD-EPI 2021: 47 mL/min/{1.73_m2} — ABNORMAL LOW (ref >59)

## 2022-06-09 MED ORDER — APIXABAN 5 MG OR TABS
5.0000 mg | ORAL_TABLET | Freq: Every day | ORAL | 2 refills | Status: DC
Start: 2022-06-09 — End: 2022-07-04

## 2022-06-09 NOTE — Patient Instructions (Signed)
Today we reviewed your diabetes care. Please obtain blood work to check on your diabetes.      I have refilled your Eliquis at 5 mg daily based on your prior dosing.      Today you did not want to proceed with a flu or covid vaccine today.

## 2022-06-10 LAB — HEMOGLOBIN A1C, HPLC: Hemoglobin A1C: 6 % (ref 4.0–6.0)

## 2022-06-11 ENCOUNTER — Encounter (HOSPITAL_COMMUNITY): Payer: Self-pay

## 2022-06-16 ENCOUNTER — Ambulatory Visit (HOSPITAL_BASED_OUTPATIENT_CLINIC_OR_DEPARTMENT_OTHER): Payer: Medicare HMO | Admitting: Unknown Physician Specialty

## 2022-06-16 ENCOUNTER — Ambulatory Visit (HOSPITAL_BASED_OUTPATIENT_CLINIC_OR_DEPARTMENT_OTHER): Payer: Medicare HMO

## 2022-06-16 ENCOUNTER — Ambulatory Visit
Admission: RE | Admit: 2022-06-16 | Discharge: 2022-06-16 | Disposition: A | Discharge status: Home / Self Care | Payer: Medicare HMO

## 2022-06-16 DIAGNOSIS — C61 Malignant neoplasm of prostate: Secondary | ICD-10-CM | POA: Insufficient documentation

## 2022-06-16 DIAGNOSIS — Z510 Encounter for antineoplastic radiation therapy: Secondary | ICD-10-CM | POA: Insufficient documentation

## 2022-06-18 ENCOUNTER — Ambulatory Visit (HOSPITAL_BASED_OUTPATIENT_CLINIC_OR_DEPARTMENT_OTHER)
Admit: 2022-06-18 | Discharge: 2022-06-18 | Disposition: A | Discharge status: Home / Self Care | Payer: Medicare HMO | Source: Home / Self Care

## 2022-06-18 ENCOUNTER — Ambulatory Visit
Admission: RE | Admit: 2022-06-18 | Discharge: 2022-06-18 | Disposition: A | Discharge status: Home / Self Care | Payer: Medicare HMO

## 2022-06-18 DIAGNOSIS — Z510 Encounter for antineoplastic radiation therapy: Secondary | ICD-10-CM | POA: Insufficient documentation

## 2022-06-18 DIAGNOSIS — Z923 Personal history of irradiation: Secondary | ICD-10-CM

## 2022-06-18 DIAGNOSIS — C61 Malignant neoplasm of prostate: Secondary | ICD-10-CM

## 2022-06-18 HISTORY — DX: Personal history of irradiation: Z92.3

## 2022-06-18 LAB — RAD ONC MSQ TREATMENT SUMMARY
Actual Fractions Delivered: 1
Actual Session Delivered Dose: 840 cGray
Actual Total Dose: 840 cGray
Course Number: 1
Elapsed Days: 0
Prescribed Fractional Dose: 840 cGray
Prescribed Number of Fractions: 5
Prescribed Total Dose: 4200 cGray

## 2022-06-18 NOTE — Progress Notes (Signed)
Nathan [name] is here today to start treatment for prostate cancer.  Radiation oncology teaching done including side effects, skin care, department routines, and how to access after hours care.  Plan of care to include weekly visits with RadOnc team while on treatment.  He verbalized understanding.  He will be arriving from [facility]. His brother Nathan [name] asked for a SW consult regarding transportation issues.Consult placed. Oncology Clinic Nurse Note      Subjective / Visit Information:    Reason for Visit: Nurse Visit         Objective:   There were no vitals filed for this visit.      Assessment and Plan:     Summary: see above    Time spent with patient (Required for Patient Education, Palliative, & Pain Visits): 30 Minutes    Visit Response: Aware of existing appointments, Aware of clinic contact information, States understanding of plan, and No further questions      Additional documentation may exist in Flowsheets or Education Activity

## 2022-06-20 ENCOUNTER — Ambulatory Visit
Admission: RE | Admit: 2022-06-20 | Discharge: 2022-06-20 | Disposition: A | Discharge status: Home / Self Care | Payer: Medicare HMO

## 2022-06-20 ENCOUNTER — Ambulatory Visit (HOSPITAL_BASED_OUTPATIENT_CLINIC_OR_DEPARTMENT_OTHER)
Admit: 2022-06-20 | Discharge: 2022-06-20 | Disposition: A | Discharge status: Home / Self Care | Payer: Medicare HMO | Source: Home / Self Care

## 2022-06-20 VITALS — BP 130/97 | HR 63 | Temp 97.2°F | Resp 16

## 2022-06-20 DIAGNOSIS — C61 Malignant neoplasm of prostate: Secondary | ICD-10-CM | POA: Insufficient documentation

## 2022-06-20 DIAGNOSIS — Z510 Encounter for antineoplastic radiation therapy: Secondary | ICD-10-CM | POA: Insufficient documentation

## 2022-06-20 LAB — RAD ONC MSQ TREATMENT SUMMARY
Actual Fractions Delivered: 2
Actual Session Delivered Dose: 840 cGray
Actual Total Dose: 1680 cGray
Course Number: 1
Elapsed Days: 2
Prescribed Fractional Dose: 840 cGray
Prescribed Number of Fractions: 5
Prescribed Total Dose: 4200 cGray

## 2022-06-20 NOTE — Weekly On Treatment Visit (Signed)
Radiation Oncology  On Treatment Visit     Radiation Treatments       Active   prostate and pelvis (Started on 06/18/2022)   Most recent fraction: 840 cGy given on 06/20/2022   Total given: 1,680 cGy / 4,200 cGy  (2 of 5 fractions)   Elapsed Days: 2   Technique: SBRT VMAT           Historical   No historical radiation treatments to show.               ID: Nathan [name] is a 71 year old male with PMHx of Type 2 DM, HLD and newly diagnosed high risk prostate cancer (iT3a/T3b, Gleason score 4+3=7, 11/12 cores positive, iPSA 28.33, prostate volume 36.6cc).  PSMA PET/CT showed localized disease with possible invasion into the Left SV base.  He is receiving SBRT to the prostate, SV and pelvic lymph nodes with concurrent ADT.     Subjective   General: He is doing well today. Tolerating ADT well.     GU: Denies any new urinary symptoms since the start of RT.     GI: Denies diarrhea.          Objective     Physical Exam:  Temp: 36.2 C  Pulse: 63  BP: 130/97  Resp: 16  SpO2: 99 %  Vitals:  Reviewed and documented in clinic record  General:  Well appearing in NAD  Chest: Normal respiratory effort, breathing comfortably          Assessment/Plan       Prostate cancer:  Reviewed potential/expected side effects. Doing well with no side effects so far.     - Continue RT as planned.   - Second Lupron injection due in early 08/2022.   - 1 month symptom check and RTC in 3 months with PSA and testosterone prior.             CBCT/Port films were reviewed.     Dosimetry treatment parameters were reviewed.     Patient treatment setup was reviewed.    Tilda Burrow, MD, PhD  Radiation Oncology

## 2022-06-20 NOTE — Weekly On Treatment Visit (Signed)
OTV with Brooks Sailors, MD.   Pain Score: 0 (06/20/22 1417)   Fatigue: Mild fatigue.   Urinary: Denies Urinary Symptoms. Q 1-1/2 to 2 hrs voiding; nocturia 1-2x.night - usual patterns  GI: Denies any gastrointestinal symptoms.   Additional notes: N/A    Radiation Treatments       Active   prostate and pelvis (Started on 06/18/2022)   Most recent fraction: 840 cGy given on 06/20/2022   Total given: 1,680 cGy / 4,200 cGy  (2 of 5 fractions)   Elapsed Days: 2   Technique: SBRT VMAT           Historical   No historical radiation treatments to show.

## 2022-06-23 ENCOUNTER — Ambulatory Visit
Admission: RE | Admit: 2022-06-23 | Discharge: 2022-06-23 | Disposition: A | Discharge status: Home / Self Care | Payer: Medicare HMO

## 2022-06-23 DIAGNOSIS — C61 Malignant neoplasm of prostate: Secondary | ICD-10-CM | POA: Insufficient documentation

## 2022-06-23 DIAGNOSIS — Z510 Encounter for antineoplastic radiation therapy: Secondary | ICD-10-CM | POA: Insufficient documentation

## 2022-06-23 LAB — RAD ONC MSQ TREATMENT SUMMARY
Actual Fractions Delivered: 3
Actual Session Delivered Dose: 840 cGray
Actual Total Dose: 2520 cGray
Course Number: 1
Elapsed Days: 5
Prescribed Fractional Dose: 840 cGray
Prescribed Number of Fractions: 5
Prescribed Total Dose: 4200 cGray

## 2022-06-24 ENCOUNTER — Ambulatory Visit: Payer: Medicare HMO

## 2022-06-24 DIAGNOSIS — C61 Malignant neoplasm of prostate: Secondary | ICD-10-CM

## 2022-06-24 NOTE — Progress Notes (Signed)
PATIENT NAVIGATOR NOTE    Pertinent Medical Information    Primary Reason for Receiving Care at [facility] (Dx & Date): 06/24/2022    ICD-10-CM    1. Prostate cancer [facility])  C61 Referral to Social Work          Patient Type: Genitourinary    Visit Information    Reason for Visit: Medical transportation    Present at the Visit: Patient and Interpreter    Reported Patient and Family Concerns:    Language Spoken: English    Race: Black or African-American    Ethnicity: Non-Hispanic or Latino/a or Latinx      Plan    Education/Care Planning/Coordination:     Plan:   I called the pt at [phone] to introduce pt navigation, per appointments pt has Transportation issues, The pt answere and the pt stated he takes the bus and doesnt have any concerns about transporation, I asked if he had any other concerns and he stated he did not, I advised the pt to give me a call if he has any concerns.         Time Spent - Direct Care: 15 minutes    Time Spent - Indirect Care: 15 minutes

## 2022-06-25 ENCOUNTER — Ambulatory Visit
Admission: RE | Admit: 2022-06-25 | Discharge: 2022-06-25 | Disposition: A | Discharge status: Home / Self Care | Payer: Medicare HMO

## 2022-06-25 DIAGNOSIS — Z510 Encounter for antineoplastic radiation therapy: Secondary | ICD-10-CM | POA: Insufficient documentation

## 2022-06-25 DIAGNOSIS — C61 Malignant neoplasm of prostate: Secondary | ICD-10-CM | POA: Insufficient documentation

## 2022-06-25 LAB — RAD ONC MSQ TREATMENT SUMMARY
Actual Fractions Delivered: 4
Actual Session Delivered Dose: 840 cGray
Actual Total Dose: 3360 cGray
Course Number: 1
Elapsed Days: 7
Prescribed Fractional Dose: 840 cGray
Prescribed Number of Fractions: 5
Prescribed Total Dose: 4200 cGray

## 2022-06-27 ENCOUNTER — Ambulatory Visit (HOSPITAL_BASED_OUTPATIENT_CLINIC_OR_DEPARTMENT_OTHER): Payer: Medicare HMO

## 2022-06-27 ENCOUNTER — Ambulatory Visit (HOSPITAL_BASED_OUTPATIENT_CLINIC_OR_DEPARTMENT_OTHER)
Admit: 2022-06-27 | Discharge: 2022-06-27 | Disposition: A | Discharge status: Home / Self Care | Payer: Medicare HMO | Source: Home / Self Care

## 2022-06-27 ENCOUNTER — Ambulatory Visit
Admission: RE | Admit: 2022-06-27 | Discharge: 2022-06-27 | Disposition: A | Discharge status: Home / Self Care | Payer: Medicare HMO

## 2022-06-27 VITALS — BP 162/90 | HR 59 | Temp 98.2°F | Resp 18 | Wt 250.8 lb

## 2022-06-27 DIAGNOSIS — C61 Malignant neoplasm of prostate: Secondary | ICD-10-CM

## 2022-06-27 DIAGNOSIS — Z510 Encounter for antineoplastic radiation therapy: Secondary | ICD-10-CM | POA: Insufficient documentation

## 2022-06-27 LAB — RAD ONC MSQ TREATMENT SUMMARY
Actual Fractions Delivered: 5
Actual Session Delivered Dose: 840 cGray
Actual Total Dose: 4200 cGray
Course Number: 1
Elapsed Days: 9
Prescribed Fractional Dose: 840 cGray
Prescribed Number of Fractions: 5
Prescribed Total Dose: 4200 cGray

## 2022-06-27 NOTE — Weekly On Treatment Visit (Signed)
Radiation Oncology  On Treatment Visit     Radiation Treatments       Active   prostate and pelvis (Started on 06/18/2022)   Most recent fraction: 840 cGy given on 06/25/2022   Total given: 3,360 cGy / 4,200 cGy  (4 of 5 fractions)   Elapsed Days: 7   Technique: SBRT VMAT           Historical   No historical radiation treatments to show.               ID: Sagan Maselli is a 71 year old male with PMHx of Type 2 DM, HLD and newly diagnosed high risk prostate cancer (iT3a/T3b, Gleason score 4+3=7, 11/12 cores positive, iPSA 28.33, prostate volume 36.6cc).  PSMA PET/CT showed localized disease with possible invasion into the Left SV base.  He is receiving SBRT to the prostate, SV and pelvic lymph nodes with concurrent ADT.     Subjective   General: He is doing well today. Tolerating ADT well.     GU: This past Monday, he had an episode of overfilling his bladder at the treatment  table and after than he had weaker stream but since then "each day is getting better". Otherwise he denies urinary frequency, urgency, incomplete emptying, dysuria.     GI: Denies diarrhea or constipation.          Objective     Physical Exam:     Vitals:  Reviewed and documented in clinic record  General:  Well appearing in NAD  Chest: Normal respiratory effort, breathing comfortably          Assessment/Plan       Prostate cancer:  Reviewed potential/expected side effects. Doing well with no side effects so far.     - Continue RT as planned.   - Second Lupron injection due in early 08/2022.   - 1 month symptom check and RTC in 3 months with PSA and testosterone prior.             CBCT/Port films were reviewed.     Dosimetry treatment parameters were reviewed.     Patient treatment setup was reviewed.    Tilda Burrow, MD, PhD  Radiation Oncology

## 2022-06-29 NOTE — Progress Notes (Unsigned)
Adult Medicine Clinic - Outpatient Return Clinic Note    Date:  06/30/2022    Nathan [name] is a 71 year old male who is here today for   Chief Complaint   Patient presents with    Follow-Up    .    INTERVAL HISTORY/HPI:    Nathan [name] is a 71 year old man hospitalized 4/14 - 11/05/20 for syncope, PE, NSTEMI, Hyoxic resp failure, HHS, Newly diagnosed DM, Asthma, AKI/CKD, and gout.       Nathan [name] is interested in discussing his DM control.   Insulin   Eye care  Anticoagulation       Nathan [name] 06/09/22:     Nathan [name] returns to clinic for follow-up.  During the visit he had several requests.  Initially he indicated that his prescription for apixaban had expired and he asked for refill.  When I attempted to confirm his dose he expressed that he was taking 1 pill each day which did not appear to be consistent with standard dosing.  When recommended to take the medication twice daily he insisted that he preferred to take 1 pill each day.     For confirmation I reviewed the discharge summary on 11/05/2020, where it was noted that he was diagnosed with a large pulmonary arterial embolism involving all lobar arteries of the bilateral lungs resulting in acute respiratory failure.  He was initiated on apixaban 10 mg twice daily and then transition to 5 mg twice daily thereafter.  It was recommended that he maintain on anticoagulation for a minimum of 6 months subject to future determination on discontinuation.  Recommendations for cancer screening, hypercoagulable workup and consideration of echo for right heart strain were included.     During his admission he was also diagnosed with hyperosmolar hyperglycemic state (HHS) with blood sugar levels in the 800s.  His hemoglobin A1c had risen to 17.1% above a prior baseline of 7.5%.  After treatment and stabilization he was discharged on 70/30 insulin now on 35 units and 25 units morning and evening respectively, and metformin 500 mg twice daily.  His hemoglobin A1c levels have returned  to prior baselines and he is well-controlled.  During the visit he inquires about a medicine that he can take orally once per week.  I indicated that I believe once a week formulations would be injected subcutaneous and he stated he was sure that he was told there was an oral preparation.  I indicated that I would review his overall portfolio and discuss options with him.  He strongly wishes to avoid ongoing injection therapy.  He also requests a follow-up with Nutrition.       Nathan [name] is undergoing active treatment for prostate cancer.  His biopsy from 01/24/2022 demonstrated multiple sites of Gleason score 3+4=7.  He indicated he was given a choice of surgery, radiation therapy or chemotherapy.  He elected for radiation therapy followed by antiandrogen therapy.  He reports he is tolerating this well.             Chart Review:    06/20/22:  Rad Onc: Continue RT as planned.  Second Lupron injection due 08/2022.  1 month symptom check, RTC in 3 months with PSA and Testosterone.         09/24/21:  DM note:  Assessment: Plan   Diabetes: fasting and prandial  glucose well controlled.  Less hypoglycemia on current insulin dose.  Has glucose tabs on hand.   BMI 40.9  Nathan [name] is interested in  trying  weekly GLP1 RA  Call to [facility] aid to determine coverage.  Both trulicity and ozempic are covered.  Discuss with Dr. Franchot [name] and coordinate follow up with [name].  Can change from NPH/Reg to NPH BID or  Reduce mix insulin by 25%  Goal : reduce risk for hypoglycemia and maintain glycemic control.  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 Mixed type cataracts, both eyes  - mild visual significance left eye  - monitor   3.    Presbyopia  - Discussed with patient the need for reading  glasses as he ages.  He understands and is happy to try reading glasses  - recommend +2.00 to +2.50 OTC readers"      Allergies: Grass and cherry blossoms. He reports they have been kicking in late November. Not allergic to ASA       SH:  He has been staying at home.  He lives at home alone, likes to fish in the warmer months, he states he believes in [place], and lives on a fixed income.  He likes to "speak when spoken to".         PROBLEM LIST: See Epic Problem List    Patient Active Problem List   Diagnosis    Obesity, unspecified    Generalized osteoarthrosis, unspecified site    Other hyperlipidemia    Presbyopia    Gout    Irregular heart beat    Achilles tendinosis of right lower extremity    Mild intermittent asthma without complication    Healthcare maintenance    Non-seasonal allergic rhinitis    Subacute massive pulmonary embolism (HCC)    Type 2 diabetes mellitus with hyperglycemia, with long-term current use of insulin (HCC)    Syncope    PSA elevation    Wellness examination    Prostate cancer [facility])     MEDICATIONS:    Current Outpatient Medications:     acetaminophen 500 MG tablet, Take 2 tablets (1,000 mg) by mouth every 6 hours as needed for pain. (Patient not taking: Reported on 04/07/2022), Disp: 20 tablet, Rfl: 0    Albuterol Sulfate HFA 108 (90 Base) MCG/ACT Inhalation Aero Soln, Inhale 2 puffs by mouth every 6 hours as needed for shortness of breath/wheezing., Disp: 1 Inhaler, Rfl: 6    allopurinol 100 MG tablet, Take 2 tablets (200 mg) by mouth daily., Disp: 180 tablet, Rfl: 0    apixaban (Eliquis) 5 MG tablet, Take 1 tablet (5 mg) by mouth daily., Disp: 30 tablet, Rfl: 2    atorvastatin 40 MG tablet, Take 1 tablet (40 mg) by mouth every evening. To lower cholesterol., Disp: 90 tablet, Rfl: 0    blood glucose test strip, Use 1 strip 4 times a day. Use to check blood sugar., Disp: 400 strip, Rfl: 4    ciprofloxacin 500 MG tablet, Take 1 tablet (500 mg) by mouth 2 times a day. Continue taking until  it is gone., Disp: 3 tablet, Rfl: 0    Fluticasone Propionate HFA 110 MCG/ACT Inhalation Aerosol, Inhale 1 puff by mouth every 12 hours., Disp: 1 Inhaler, Rfl: 6    glucometer (OneTouch Verio Flex System) w/Device kit, Use to check blood sugar 4 times  a day., Disp: 1 kit, Rfl: 6    glucose 4 g chewable tablet, Chew and swallow 4 tablets (16 g) by mouth every 15 minutes as needed for low blood sugar. Recheck blood sugar as needed., Disp: 50 tablet, Rfl: 3    ibuprofen 400 MG tablet, Take 1 tablet (400 mg) by mouth every 6 hours as needed for pain., Disp: 20 tablet, Rfl: 0    insulin NPH & REGULAR (HumuLIN 70/30 KwikPen) (70-30) 100 UNIT/ML pen-injector, Inject 35 units under the skin every morning AND 20 units every evening., Disp: 75 mL, Rfl: 2    ketoconazole 2 % shampoo, apply topically face and scalp if needed, Disp: 120 mL, Rfl: 1    lancet (OneTouch Delica Plus) 24M, Use 1 to check blood glucose 4 times a day., Disp: 400 each, Rfl: 2    metFORMIN 500 MG tablet, Take 1 tablet (500 mg) by mouth 2 times a day with meals., Disp: 180 tablet, Rfl: 0    pen needle (TechLite Pen Needles) 31G X 8 MM, Use to inject insulin 2 times a day., Disp: 200 each, Rfl: 2    Triamcinolone Acetonide 0.1 % External Ointment, Apply to affected area on neck 2 times a day., Disp: 15 g, Rfl: 0    DM Meds:      Insulin 70/30 35 - 40 units in am, 20 units in pm  Metformin 500 mg bid    Apixaban 5 mg q day (he is written for bid)    ALLERGIES:  Review of patient's allergies indicates:  No Known Allergies    PHYSICAL EXAM:    BP (!) 148/104   Pulse 69   Temp 36.2 C   Resp 18   Wt (!) 113.4 kg (250 lb)   SpO2 95%   BMI 39.16 kg/m     Lungs clear without wheezing  COR RRR w/o  Skin tag on low back 5 mm flat      LAB AND IMAGING RESULTS:  Orders Only on 06/09/22   1. Lipid Panel   Result Value Ref Range    Total Cholesterol 146 <200 mg/dL    Triglyceride 151 (H) <150 mg/dL    HDL Cholesterol 46 >39 mg/dL    Non-HDL Cholesterol 100 0 -  159 mg/dL    LDL Cholesterol, NIH Equation 74 <130 mg/dL    Cholesterol/HDL Ratio 3.2     Lipid Panel, Additional Info. (NOTE)    2. Comprehensive Metabolic Panel   Result Value Ref Range    Sodium 142 135 - 145 meq/L    Potassium 4.5 3.6 - 5.2 meq/L    Chloride 104 98 - 108 meq/L    Carbon Dioxide, Total 32 22 - 32 meq/L    Anion Gap 6 4 - 12    Glucose 101 62 - 125 mg/dL    Urea Nitrogen 22 (H) 8 - 21 mg/dL    Creatinine 1.57 (H) 0.51 - 1.18 mg/dL    Protein (Total) 7.4 6.0 - 8.2 g/dL    Albumin 4.1 3.5 - 5.2 g/dL    Bilirubin (Total) 0.7 0.2 - 1.3 mg/dL    Calcium 9.3 8.9 - 10.2 mg/dL    AST (GOT) 21 9 - 38 U/L    Alkaline Phosphatase (Total) 83 36 - 161 U/L    ALT (GPT) 26 10 - 48 U/L    eGFR by CKD-EPI 2021 47 (L) >59 mL/min/1.73_m2       ASSESSMENT AND PLAN:  Diabetes:  Humulin 70/30 and Metformin.     Foot Exam - 11/26/20, 07/04/21, 11/25/21 completed.   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  Mixed type cataracts, both eyes, mild. Monitor. Referrral for annual eye exam placed for August 2023 and again in Nov 2023 and Dec 2023.     Alb/Creat = 7 (11/25/21)       A1C [facility] AMB    A1C   Latest Ref Rng 4.0 - 6.0 %   11/01/2020 17.1 (H)    11/04/2020 17.5 (H)    05/20/2021 6.1 (H)    11/25/2021 6.1 (H)    06/09/2022 6.0        2. 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  He has been treated with Apixaban 5 mg bid, however, he insists he was intended to be treated with 5 mg qd, which he has been taking. This is refilled.   At this time I will work with Anticoagulation program to consider duration of therapy.  (Provoked vs non-provoked).      3. Asthma.  He uses -Fluticasone 110 mcg BID  -Albuterol Q6hr PRN and has a stable pattern.  He reports that he has never called 911 or been hospitalized for Asthma.  No documentation of PFTs at [place] or Care Everywhere.  PEF on exam today 300 lpm, no  wheezing on exam.  Will refill meds and refer for PFTs.  (PFTs not yet obtained)      4. Cardiac Risk Factors.  BP 124/62 off meds, BMI 47, LDL 96, DM no, No FH CAD, No tobacco. No MI or Stroke.  EKG (11/01/21): NSR, Axis - 52 (LAD), PR N, QRS 168, QTc 459, RBBB, LAHB, No Q-waves.  ASCVD Risk 9.8% Atorvastatin 40 MG. ASA has been stopped.  Repeat lipids.         Cholesterol    LDL Cholesterol   Latest Ref Rng <130 mg/dL   11/01/2020 107    05/20/2021 62    06/09/2022 74        5. HTN.  H     Vitals     Systolic Diastolic   16/04/9603 540 !  85 !     157 !  100 (H)    06/09/2022 145 !  72 !    06/18/2022     06/20/2022 130 !  97 (H)    06/27/2022 162 !  90 !    06/30/2022 148 !  104 (H)         5. CKD (Grade 3a).  Stable pattern over time.   Creatinine 1.60 - 1.45 mg/dL ()  GFR 44 - 52 ml/min (05/20/21)  Prot / Creat < 0.1 (02/22/16)   Alb/Creat 7 (05/20/21)      Renal Function Testing    Creatinine   Latest Ref Rng 0.51 - 1.18 mg/dL   11/25/2021 1.56 (H)    02/14/2022 1.6 (H)    05/26/2022 1.5 !    06/09/2022 1.57 (H)       6.  Prostate Cancer Screening:  No FH of prostate CA.   He elects to obtain a PSA after SDM.  (07/04/21)  PSA 28.19 (referral sent on 07/10/21, patients questions were addressed but closed.)  11/25/21:  Again SDM today with patient agreement to proceed with referral to Urology.  He is undergoing therapy for prostate cancer (XRT and Lupron)  6. Chronic gout.  He report intermittent foot pain but is non-specific on interview.  Records indicate last flare podagra (L great toe) in June 2017.  He is minimizing red meat, and no alcohol.  He has CKD therefore goal is to use tylenol over NSAIDs for pain.  He is on Allopurinol 200 mg daily, Colchicine 0.6 mg 2 tabs for flare, then 1 tab 1 hr later.  Uric Acid 7.4 (07/07/18).  CrCl at 50 mL/min, allopurinol and colchicine doses acceptable.  ASA has been stopped due to gout and CKD.  (stable 11/26/20)     7. Tinea Capitus.  Ketoconazole shampoo      8.  Derm.  He has  multiple skin tags.  Cryotherapy provided today.      8.  Social:  He has no DPOA.  But his brother Elta Guadeloupe who lives in [place] calls and checks on him the most.  He would want Elta Guadeloupe involved if her were unable to make decisions for himself.         HEALTH CARE MAINTANENCE / SCREENING:  (See below)      PHQ 2 Screening:        PHQ-9    PHQ2 Total Score   07/11/2019 0    04/23/2022 0    06/30/2022 0      Colorectal Cancer Screening:  FIT negative (07/23/18 and 07/10/21). Patient declines colonoscopy.  2017 - 07/10/21: FIT Negative x 5.     Tobacco Use:  Quit at age 55.  ETOH Use:  Occ use   Marijuana:  Occ      Hepatitis C / HIV Screening:  HIV negative 2013.  Hep C (1945-65 + high risk): Neg 2013           He declines a flu vaccine. Bivalent COVID booster. (07/04/21).        Declines COVID and Flu.  RSV at the local pharmacy.  He declines all today.  (06/09/22).    Immunization History   Administered Date(s) Administered    COVID-19 Moderna mRNA monovalent 12 yrs and older 09/20/2019, 10/18/2019    COVID-19 Pfizer mRNA bivalent 12 yrs and older 07/04/2021    COVID-19 Pfizer mRNA monovalent tris-sucrose 12 yrs and older (gray cap) 11/26/2020    Hepatitis B, unspecified 10/05/2007, 11/05/2007, 05/08/2008    Influenza quadrivalent PF 04/19/2013, 06/12/2014, 05/01/2015    Influenza quadrivalent adjuvanted (Fluad) 05/19/2019    Influenza trivalent PF 07/07/2012    Influenza, unspecified 09/26/2010    Pneumococcal polysaccharide PPSV23 (Pneumovax 23) 02/12/2009    Tdap 01/21/2011     Future Appointments   Date Time Provider [place]   06/30/2022  1:30 PM [name], MD [facility] AM   07/03/2022  1:30 PM H42 NUTRITIONIST [facility] AM   09/22/2022  2:00 PM [name], MD [facility] AM       F/U Dr. Franchot [name] on 09/22/22. .      Total time spent in the direct care of this patient and in reviewing records and documentation on the date of service was  25 minutes.

## 2022-06-30 ENCOUNTER — Ambulatory Visit: Payer: Medicare HMO | Attending: Internal Medicine | Admitting: Internal Medicine

## 2022-06-30 VITALS — BP 148/104 | HR 69 | Temp 97.2°F | Resp 18 | Wt 250.0 lb

## 2022-06-30 DIAGNOSIS — I10 Essential (primary) hypertension: Secondary | ICD-10-CM | POA: Insufficient documentation

## 2022-06-30 DIAGNOSIS — E1165 Type 2 diabetes mellitus with hyperglycemia: Secondary | ICD-10-CM | POA: Insufficient documentation

## 2022-06-30 DIAGNOSIS — Z794 Long term (current) use of insulin: Secondary | ICD-10-CM | POA: Insufficient documentation

## 2022-06-30 DIAGNOSIS — J4520 Mild intermittent asthma, uncomplicated: Secondary | ICD-10-CM | POA: Insufficient documentation

## 2022-06-30 MED ORDER — AMLODIPINE BESYLATE 5 MG OR TABS
5.0000 mg | ORAL_TABLET | Freq: Every day | ORAL | 3 refills | Status: DC
Start: 2022-06-30 — End: 2022-07-03

## 2022-06-30 NOTE — Patient Instructions (Signed)
Today we reviewed your diabetes including your insulin and metformin.  Your recent home blood sugars were 90 and 135.      I placed a referral for eye care.      I placed a referral for pulmonary function tests.    You are receiving care for prostate cancer.      Today you had a skin tag on your back that was frozen off.      I will see you in March 2024.

## 2022-07-02 NOTE — Progress Notes (Signed)
I placed a call to Nathan [name] to obtain further information on 07/01/22 at approximately 6 p.m.     I asked for further information about his glycemic control and if he had experienced any hypoglycemia.    I asked what the lowest value he had seen, and he indicated about 40, and when I asked when, he was uncertain but stated it was probably in the last 1 - 2 weeks.  I asked him to check his BS during the call which was 106.  He stated he had enough food and was eating Ramen Noodles for dinner.      I made arrangements to call him back for medication reconciliation.      I will also ask Denice Bors to work with Nathan [name] on insulin dose reduction given his outstanding response from A1c at 17.4 to now 6.0 and report of some hypoglycemic events.

## 2022-07-03 ENCOUNTER — Telehealth (HOSPITAL_BASED_OUTPATIENT_CLINIC_OR_DEPARTMENT_OTHER): Payer: Self-pay | Admitting: Internal Medicine

## 2022-07-03 ENCOUNTER — Other Ambulatory Visit (HOSPITAL_BASED_OUTPATIENT_CLINIC_OR_DEPARTMENT_OTHER): Payer: Self-pay

## 2022-07-03 ENCOUNTER — Ambulatory Visit: Payer: Medicare HMO | Attending: Internal Medicine | Admitting: Registered"

## 2022-07-03 DIAGNOSIS — E1165 Type 2 diabetes mellitus with hyperglycemia: Secondary | ICD-10-CM | POA: Insufficient documentation

## 2022-07-03 DIAGNOSIS — I2699 Other pulmonary embolism without acute cor pulmonale: Secondary | ICD-10-CM

## 2022-07-03 DIAGNOSIS — J4520 Mild intermittent asthma, uncomplicated: Secondary | ICD-10-CM

## 2022-07-03 DIAGNOSIS — I10 Essential (primary) hypertension: Secondary | ICD-10-CM

## 2022-07-03 DIAGNOSIS — C61 Malignant neoplasm of prostate: Secondary | ICD-10-CM

## 2022-07-03 DIAGNOSIS — Z794 Long term (current) use of insulin: Secondary | ICD-10-CM | POA: Insufficient documentation

## 2022-07-03 DIAGNOSIS — Z0000 Encounter for general adult medical examination without abnormal findings: Secondary | ICD-10-CM

## 2022-07-03 DIAGNOSIS — Z713 Dietary counseling and surveillance: Secondary | ICD-10-CM | POA: Insufficient documentation

## 2022-07-03 MED ORDER — APIXABAN 2.5 MG OR TABS
2.5000 mg | ORAL_TABLET | Freq: Two times a day (BID) | ORAL | 3 refills | Status: DC
Start: 2022-07-03 — End: 2022-11-12

## 2022-07-03 MED ORDER — ENALAPRIL MALEATE 5 MG OR TABS
2.5000 mg | ORAL_TABLET | Freq: Every day | ORAL | 2 refills | Status: DC
Start: 2022-07-03 — End: 2022-11-12

## 2022-07-03 MED ORDER — HUMULIN 70/30 KWIKPEN (70-30) 100 UNIT/ML SC SUPN
PEN_INJECTOR | SUBCUTANEOUS | 2 refills | Status: DC
Start: 2022-07-03 — End: 2022-09-22

## 2022-07-03 NOTE — Progress Notes (Signed)
Nutrition Note    Clinic:  Adult Medicine    Note Type: Followup    Reason For Visit: Diabetes management   Referral Dr Franchot Erichsen 06/10/22    An interpreter was not needed for the visit.    Assessment    Patient Active Problem List   Diagnosis    Obesity, unspecified    Generalized osteoarthrosis, unspecified site    Other hyperlipidemia    Presbyopia    Gout    Irregular heart beat    Achilles tendinosis of right lower extremity    Mild intermittent asthma without complication    Healthcare maintenance    Non-seasonal allergic rhinitis    Subacute massive pulmonary embolism (HCC)    Type 2 diabetes mellitus with hyperglycemia, with long-term current use of insulin (HCC)    Syncope    PSA elevation    Wellness examination    Prostate cancer ([place])     Weight History:    Weight (pounds)   06/30/2022 250 lb      Adult BMI Classification: obese categ. III (>40)     Weight (pounds)   08/21/2021 245 lb 8 oz      Initial RD visit  Vitals Weight (lbs)   11/28/2020 248 lb     Labs:    DIABETES [facility] AMB    A1C   Latest Ref Rng 4.0 - 6.0 %   11/04/2020 17.5 (H)    05/20/2021 6.1 (H)    11/25/2021 6.1 (H)    06/09/2022 6.0      Self monitored blood glucose: forgot his glucometer at home, continues to check 2x/d before breakfast  (80-90) and before dinner (70- 95).  Per Denice Bors may initiate CGM    3/23- 10/23/21:  Avg '108mg'$ /dL: Fasting 73- '223mg'$ /dL (avg '119mg'$ /dL)  Before dinner 57- '199mg'$ /dL (avg '101mg'$ /dL )- 2 readings of '57mg'$ /dL    Medications include:  70/30  decreased to 20am/ 10pm (from 35/20) 2/2 hypoglycemia (Dr Franchot Erichsen note 07/03/22 mentions stopping w/ initiation of Trulicity )  Trulicity .'75mg'$ /wk ordered 07/03/22    Vitamins/Minerals/Herbal Supplements: not discussed 07/03/22     Previous: started using apple cider vinegar 1tsp every other day (friend told him it could help w/ weight and diabetes)    Barriers to adequate intake: none    GI tolerance: None, Per [name] no issues with cancer treatment    Activity:  Previously  reported  fishing, some walking involved not as active since going through chemo/radiation for cancer    Food intolerances/allergies: None reported    Diet History:   B: frosted cheerios  L: not often  D: 4-5p eats boiled chicken/turkey hotdog/fish,veg/ salad;  Red beans and rice, sandwiches   Snacks: potato chips/crackers and cheese  Beverages: zero sugar gatorade, cranberry juice- heard this was healthy    Previous  Stayed away from sausage and bacon 2/2 recommendations from provider in Oncology  Does not use salt  Snacks: pears, pecans/nuts  Drinking orange juice especially if he feels woozy- feels that it works better than glucose tablets  Evening snack: no longer buying sugar-free pie "I learned from you that the filling was not the issue but the crust"    Information from Patient: .   Using glucose tabs to prevent lows, vague when asked about frequency    Previous  -Learning more about nutrition management helpful- better in small chunks of information  -Would like to know what he needs to do to not need insulin  -Pt had many  questions about individual foods based on videos he'd seen on YouTube (e.g. sourdough, gluten, olive oil, tomatoes).     Assessment Summary: 71 year old male w/ history of asthma, mild CKD, gout PE, NSTEMI, HHS,  DM diagnosed 10/2020,  AKI, has been going through treatment for prostate CA,   .    Nutrition Diagnosis:   Food and nutrition related knowledge deficit evidenced by pt questions and report.  Pt benefits from repeated education regarding carbohydrate managed diet especially concept of carbohydrate vs sugar.   Diabetes management improved based on A1c and SMBG, continued lows especially in the evening - possibly d/t not eating midday.  change in medications 12/14 may help prevent hypos  - weight stable through cancer treatment.       Interventions and Patient Goals:   - continue to encourage [place] to include midday meal/snack   - recommended limiting cranberry juice to 1/2c  serving- discussed that it will increase glucose levels    Previous  Reviewed hypoglycemia treatment: If glucose is less than 70 ok to drink juice, should only need ~4oz    Education Provided:  Please refer to Education section of chart     Education materials provided:   Previous  Insulin dosage plan   My Food Plan  Insulin instructions in AVS    Time Spent with Patient: 30  mins    Plan/Recommendations    Follow up: 09/22/22    Plan for next visit: answer questions, review SMBG    Total Time Spent:  45 mins      Ed Blalock, MS, RD, CD, [facility] Dietitian  Department mobile: [phone]  Email: [email or web address]

## 2022-07-03 NOTE — Telephone Encounter (Addendum)
I spoke with Nathan [name] today.  He had just returned home from a visit with Nathan [name] in Nutrition.     I let him know that given his very good glucose control, we would be changing him from insulin to another form of injection.  I let him know that Denice Bors would plan to call him.  I reviewed the following other updates with him.      Plan:  Reduce Insulin 70/30 to 20 units in am and 10 units in pm. Dosing change discussed with patient and submitted.  Check BS as he is doing.     Continue Metformin  Begin GLP-1 Agonist, to be determined.   Begin Enalapril 5 mg (1/2 tab) 2.5 mg daily, monitor BP, escalate as indicated  - script submitted.    I clarified Apixaban dose per discussion with Anti-coagulation.  I will plan to transition to 2.5 mg bid now after more than 6 months of anticoagulation since PE.   - script submitted.    [name] plans to call him tomorrow to review insulin regimen changes.    Patient repeated back these plans.          Current Outpatient Medications:     acetaminophen 500 MG tablet, Take 2 tablets (1,000 mg) by mouth every 6 hours as needed for pain. (Patient not taking: Reported on 04/07/2022), Disp: 20 tablet, Rfl: 0    Albuterol Sulfate HFA 108 (90 Base) MCG/ACT Inhalation Aero Soln, Inhale 2 puffs by mouth every 6 hours as needed for shortness of breath/wheezing., Disp: 1 Inhaler, Rfl: 6    allopurinol 100 MG tablet, Take 2 tablets (200 mg) by mouth daily., Disp: 180 tablet, Rfl: 0    apixaban (Eliquis) 5 MG tablet, Take 1 tablet (5 mg) by mouth daily., Disp: 30 tablet, Rfl: 2    atorvastatin 40 MG tablet, Take 1 tablet (40 mg) by mouth every evening. To lower cholesterol., Disp: 90 tablet, Rfl: 0    blood glucose test strip, Use 1 strip 4 times a day. Use to check blood sugar., Disp: 400 strip, Rfl: 4    ciprofloxacin 500 MG tablet, Take 1 tablet (500 mg) by mouth 2 times a day. Continue taking until it is gone., Disp: 3 tablet, Rfl: 0    Fluticasone Propionate HFA 110 MCG/ACT Inhalation  Aerosol, Inhale 1 puff by mouth every 12 hours., Disp: 1 Inhaler, Rfl: 6    glucometer (OneTouch Verio Flex System) w/Device kit, Use to check blood sugar 4 times a day., Disp: 1 kit, Rfl: 6    glucose 4 g chewable tablet, Chew and swallow 4 tablets (16 g) by mouth every 15 minutes as needed for low blood sugar. Recheck blood sugar as needed., Disp: 50 tablet, Rfl: 3    ibuprofen 400 MG tablet, Take 1 tablet (400 mg) by mouth every 6 hours as needed for pain., Disp: 20 tablet, Rfl: 0    ketoconazole 2 % shampoo, apply topically face and scalp if needed, Disp: 120 mL, Rfl: 1    lancet (OneTouch Delica Plus) 24O, Use 1 to check blood glucose 4 times a day., Disp: 400 each, Rfl: 2    metFORMIN 500 MG tablet, Take 1 tablet (500 mg) by mouth 2 times a day with meals., Disp: 180 tablet, Rfl: 0    pen needle (TechLite Pen Needles) 31G X 8 MM, Use to inject insulin 2 times a day., Disp: 200 each, Rfl: 2    Triamcinolone Acetonide 0.1 % External Ointment, Apply  to affected area on neck 2 times a day., Disp: 15 g, Rfl: 0

## 2022-07-04 ENCOUNTER — Telehealth (HOSPITAL_BASED_OUTPATIENT_CLINIC_OR_DEPARTMENT_OTHER): Payer: Self-pay | Admitting: Internal Medicine

## 2022-07-04 ENCOUNTER — Ambulatory Visit: Payer: Medicare HMO

## 2022-07-04 DIAGNOSIS — E1165 Type 2 diabetes mellitus with hyperglycemia: Secondary | ICD-10-CM

## 2022-07-04 MED ORDER — TRULICITY 0.75 MG/0.5ML SC SOAJ
0.7500 mg | SUBCUTANEOUS | 1 refills | Status: DC
Start: 2022-07-04 — End: 2022-09-22

## 2022-07-04 MED ORDER — GLUCOSE 4 G OR CHEW
16.0000 g | CHEWABLE_TABLET | ORAL | 3 refills | Status: AC | PRN
Start: 2022-07-04 — End: ?

## 2022-07-04 MED ORDER — TRULICITY 0.75 MG/0.5ML SC SOAJ
0.7500 mg | SUBCUTANEOUS | 1 refills | Status: DC
Start: 2022-07-04 — End: 2022-07-04

## 2022-07-04 NOTE — Telephone Encounter (Signed)
I called Nathan [name] at about 4 p.m.    He had had a conversation with Linda today.  I asked him to summarize the plans that were made.  He mentioned a glucose monitor to be placed on his shoulder.  He stated "something" was on hold which I confirmed was Semaglutide.   I let him know that we would be prescribing Dulaglutide 0.75 sc weekly in the interim.    I reviewed the transition to Apixaban 2.5 mg bid and initiation of Enalapril 1/2 tab (2.5 mg q d) for elevated BP.      He will pick up his medications at [facility] tomorrow.         Current Outpatient Medications:     acetaminophen 500 MG tablet, Take 2 tablets (1,000 mg) by mouth every 6 hours as needed for pain. (Patient not taking: Reported on 04/07/2022), Disp: 20 tablet, Rfl: 0    Albuterol Sulfate HFA 108 (90 Base) MCG/ACT Inhalation Aero Soln, Inhale 2 puffs by mouth every 6 hours as needed for shortness of breath/wheezing., Disp: 1 Inhaler, Rfl: 6    allopurinol 100 MG tablet, Take 2 tablets (200 mg) by mouth daily., Disp: 180 tablet, Rfl: 0    apixaban (Eliquis) 2.5 MG tablet, Take 1 tablet (2.5 mg) by mouth 2 times a day., Disp: 60 tablet, Rfl: 3    atorvastatin 40 MG tablet, Take 1 tablet (40 mg) by mouth every evening. To lower cholesterol., Disp: 90 tablet, Rfl: 0    blood glucose test strip, Use 1 strip 4 times a day. Use to check blood sugar., Disp: 400 strip, Rfl: 4    ciprofloxacin 500 MG tablet, Take 1 tablet (500 mg) by mouth 2 times a day. Continue taking until it is gone., Disp: 3 tablet, Rfl: 0    dulaglutide (Trulicity) 0.75 MG/0.5ML pen-injector, Inject 0.5 mL (0.75 mg) under the skin one time a week. 4 once weekly doses provided, Disp: 2 mL, Rfl: 1    enalapril 5 MG tablet, Take 0.5 tablets (2.5 mg) by mouth daily., Disp: 30 tablet, Rfl: 2    Fluticasone Propionate HFA 110 MCG/ACT Inhalation Aerosol, Inhale 1 puff by mouth every 12 hours., Disp: 1 Inhaler, Rfl: 6    glucometer (OneTouch Verio Flex System) w/Device kit, Use to check blood sugar  4 times a day., Disp: 1 kit, Rfl: 6    glucose 4 g chewable tablet, Chew and swallow 4 tablets (16 g) by mouth every 15 minutes as needed for low blood sugar. Recheck blood sugar as needed., Disp: 50 tablet, Rfl: 3    ibuprofen 400 MG tablet, Take 1 tablet (400 mg) by mouth every 6 hours as needed for pain., Disp: 20 tablet, Rfl: 0    insulin NPH & REGULAR (HumuLIN 70/30 KwikPen) (70-30) 100 UNIT/ML pen-injector, Inject 20 units under the skin every morning AND 10 units every evening., Disp: 75 mL, Rfl: 2    ketoconazole 2 % shampoo, apply topically face and scalp if needed, Disp: 120 mL, Rfl: 1    lancet (OneTouch Delica Plus) 33g, Use 1 to check blood glucose 4 times a day., Disp: 400 each, Rfl: 2    metFORMIN 500 MG tablet, Take 1 tablet (500 mg) by mouth 2 times a day with meals., Disp: 180 tablet, Rfl: 0    pen needle (TechLite Pen Needles) 31G X 8 MM, Use to inject insulin 2 times a day., Disp: 200 each, Rfl: 2    Triamcinolone Acetonide 0.1 % External Ointment,   Apply to affected area on neck 2 times a day., Disp: 15 g, Rfl: 0

## 2022-07-04 NOTE — Progress Notes (Signed)
Nurse Care Management Brief Note:    Visit type: Appointed    Location: phone    The language the patient speaks: [place]   Interpreter present: None    Other(s) present:  NO    Summary: phone outreach to [place].  Diabetes:  Hemoglobin A1C (%)   Date Value   06/09/2022 6.0   11/25/2021 6.1 (H)   05/20/2021 6.1 (H)      Latest Reference Range & Units 05/20/21 14:44 11/25/21 14:01   Albumin (Micro), URN mg/dL 2.05 0.88   Albumin/Creatinine Ratio, URN <30 mg/gcreat 7 7     Humulin NPH/Reg 70/30 mix 35/20, reduced to 20/10 due h/o hypoglycemia.  Discussed addition of weekly GLP1 RA if covered and available.  Explained benefits of using CGM sensor to help safely adjust insulin.      BP Readings from Last 3 Encounters:   06/30/22 (!) 148/104   06/27/22 (!) 162/90   06/20/22 (!) 130/97     Enalapril 2.5 mg added 12/14    H/o PE: reinforced new dose of eloquis.    Prostate cancer: completed radiation tx.    Assessment: Plan   Diabetes: h/o recurrent hypoglycemia on NPH/Reg mix.  Dose reduced by Dr. Franchot Erichsen to 20 am 10 pm    Call to [facility] [place], out of ozempic, has trulicity in stock.  Request order for dulaglutide, 0.75 mg weekly.    Agrees to order of CGM sensor  Assisted our [facility] Dani with order on parachute to DME vendor [employer].    Arrange follow up when receives sensors and/or trulicity for on site training.    Spoke with [employer], ozempic on back order, trulicity is available.  Request for order sent to Dr. Franchot Erichsen.    Next Follow up:             07/11/2022 2:00 PM (Arrive by 1:45 PM) [facility]    09/22/2022 1:00 PM (Arrive by 12:45 PM) [facility]    09/22/2022 2:00 PM (Arrive by 1:45 PM) [name], MD [facility]

## 2022-07-07 ENCOUNTER — Telehealth (HOSPITAL_BASED_OUTPATIENT_CLINIC_OR_DEPARTMENT_OTHER): Payer: Self-pay | Admitting: Internal Medicine

## 2022-07-07 NOTE — Telephone Encounter (Signed)
I called Nathan [name] (07/07/22) to inquire about his medication regimen given there are multiple changes occurring.     He stated he picked up Dulaglutide this weekend, read the instructions and gave himself a first injection this morning.  He confirmed he received a total of 4 injector units.      Vaughan Basta will call him to coordinate glycemic control.      He reported that his BS was 104, BP 164/86, HR 69 this morning.   He will take BP every 1 - 2 days.      He confirmed taking the new dose of Eliquis 2.5 mg bid (he discarded the 5 mg dose)    He confirmed receiving Enalapril 5 mg (1/2 tab daily).  I provided information that this is for his blood pressure.              Current Outpatient Medications:     acetaminophen 500 MG tablet, Take 2 tablets (1,000 mg) by mouth every 6 hours as needed for pain. (Patient not taking: Reported on 04/07/2022), Disp: 20 tablet, Rfl: 0    Albuterol Sulfate HFA 108 (90 Base) MCG/ACT Inhalation Aero Soln, Inhale 2 puffs by mouth every 6 hours as needed for shortness of breath/wheezing., Disp: 1 Inhaler, Rfl: 6    allopurinol 100 MG tablet, Take 2 tablets (200 mg) by mouth daily., Disp: 180 tablet, Rfl: 0    apixaban (Eliquis) 2.5 MG tablet, Take 1 tablet (2.5 mg) by mouth 2 times a day., Disp: 60 tablet, Rfl: 3    atorvastatin 40 MG tablet, Take 1 tablet (40 mg) by mouth every evening. To lower cholesterol., Disp: 90 tablet, Rfl: 0    blood glucose test strip, Use 1 strip 4 times a day. Use to check blood sugar., Disp: 400 strip, Rfl: 4    ciprofloxacin 500 MG tablet, Take 1 tablet (500 mg) by mouth 2 times a day. Continue taking until it is gone., Disp: 3 tablet, Rfl: 0    dulaglutide (Trulicity) 6.23 JS/2.8BT pen-injector, Inject 0.5 mL (0.75 mg) under the skin one time a week. 4 once weekly doses provided, Disp: 2 mL, Rfl: 1    enalapril 5 MG tablet, Take 0.5 tablets (2.5 mg) by mouth daily., Disp: 30 tablet, Rfl: 2    Fluticasone Propionate HFA 110 MCG/ACT Inhalation Aerosol, Inhale 1  puff by mouth every 12 hours., Disp: 1 Inhaler, Rfl: 6    glucometer (OneTouch Verio Flex System) w/Device kit, Use to check blood sugar 4 times a day., Disp: 1 kit, Rfl: 6    glucose 4 g chewable tablet, Chew and swallow 4 tablets (16 g) by mouth every 15 minutes as needed for low blood sugar. Recheck blood sugar as needed., Disp: 50 tablet, Rfl: 3    ibuprofen 400 MG tablet, Take 1 tablet (400 mg) by mouth every 6 hours as needed for pain., Disp: 20 tablet, Rfl: 0    insulin NPH & REGULAR (HumuLIN 70/30 KwikPen) (70-30) 100 UNIT/ML pen-injector, Inject 20 units under the skin every morning AND 10 units every evening., Disp: 75 mL, Rfl: 2    ketoconazole 2 % shampoo, apply topically face and scalp if needed, Disp: 120 mL, Rfl: 1    lancet (OneTouch Delica Plus) 51V, Use 1 to check blood glucose 4 times a day., Disp: 400 each, Rfl: 2    metFORMIN 500 MG tablet, Take 1 tablet (500 mg) by mouth 2 times a day with meals., Disp: 180 tablet, Rfl: 0  pen needle (TechLite Pen Needles) 31G X 8 MM, Use to inject insulin 2 times a day., Disp: 200 each, Rfl: 2    Triamcinolone Acetonide 0.1 % External Ointment, Apply to affected area on neck 2 times a day., Disp: 15 g, Rfl: 0

## 2022-07-08 ENCOUNTER — Telehealth (HOSPITAL_BASED_OUTPATIENT_CLINIC_OR_DEPARTMENT_OTHER): Payer: Self-pay | Admitting: Internal Medicine

## 2022-07-08 ENCOUNTER — Other Ambulatory Visit (HOSPITAL_BASED_OUTPATIENT_CLINIC_OR_DEPARTMENT_OTHER): Payer: Self-pay

## 2022-07-08 NOTE — Telephone Encounter (Signed)
I called and spoke with Nathan [name].  His BS is 117 today.    I confirmed his use of Apixaban 2.5 mg bid and Enalapril 2.5 mg daily.  He stated he would check his blood pressure every other day.      I asked if he was taking his 70/30 20 units and 10 units and he expressed disappointment about the need to continue with twice daily insulin and the weekly Trulicity.  I provided the reasons for this recommendation.  He stated he would do this.  I let him know Vaughan Basta would be calling soon and would further discuss this with him.

## 2022-07-08 NOTE — Progress Notes (Signed)
Nurse Care Management Brief Note:    Visit type: outreach     Location: phone    The language the patient speaks: [place]   Interpreter present: None    Other(s) present:  NO    Summary: Phone follow up re: start of trulicity on Monday.      BG 117 this am  77 yesterday  NPH/Reg 70/30  insulin   20 units am 10 units pm    Discussed titration of trulicity dose monthly   weekly follow up by phone to review SMBG   Titration of insulin to achieve target glucose.    Assessment: Plan   Teach back on dose of insulin, trulicity.  Goal to reduce to basal insulin only or no insulin.    CGM order in process.    Arrange phone follow up 1 week.    Next Follow up:             07/11/2022 2:00 PM (Arrive by 1:45 PM) [facility]    09/22/2022 1:00 PM (Arrive by 12:45 PM) [facility]    09/22/2022 2:00 PM (Arrive by 1:45 PM) [name], MD [facility]

## 2022-07-11 ENCOUNTER — Ambulatory Visit: Payer: Medicare HMO

## 2022-07-11 DIAGNOSIS — E1165 Type 2 diabetes mellitus with hyperglycemia: Secondary | ICD-10-CM

## 2022-07-11 NOTE — Progress Notes (Signed)
Nurse Care Management Brief Note:    Visit type: Appointed    Location: phone    The language the patient speaks: [place]   Interpreter present: None    Other(s) present:  NO    Summary: Phone follow up with [name] to review BG with addition of trulicity and reduction of NPH/Regular mix to 20 am 10 mg  SMBG  100, 127  Mild nausea after initiating trulicity.    Hemoglobin A1C (%)   Date Value   06/09/2022 6.0   11/25/2021 6.1 (H)   05/20/2021 6.1 (H)     Blood pressure  BP Readings from Last 3 Encounters:   06/30/22 (!) 148/104   06/27/22 (!) 162/90   06/20/22 (!) 130/97       Assessment: Plan     Teach back on dose of insulin, trulicity.  Advised about dietary changes known to reduce GI s/e on GLP1 RA  Agrees to bid smbg  Arrange phone follow up 1 week.      CGM order in process.     Arrange phone follow up 1 week.       Next Follow up:             07/18/2022 3:30 PM (Arrive by 3:15 PM) [facility]    09/22/2022 1:00 PM (Arrive by 12:45 PM) [facility]    09/22/2022 2:00 PM (Arrive by 1:45 PM) [name], MD [facility]

## 2022-07-15 ENCOUNTER — Encounter (HOSPITAL_BASED_OUTPATIENT_CLINIC_OR_DEPARTMENT_OTHER): Payer: Self-pay

## 2022-07-18 ENCOUNTER — Ambulatory Visit: Payer: Medicare HMO

## 2022-07-18 DIAGNOSIS — E1165 Type 2 diabetes mellitus with hyperglycemia: Secondary | ICD-10-CM

## 2022-07-18 NOTE — Progress Notes (Signed)
Nurse Care Management Brief Note:    Visit type: Appointed    Location: phone    The language the patient speaks: [place]   Interpreter present: None    Other(s) present:  NO    Summary:   Summary: Phone follow up with Nathan [name] to review BG with addition of trulicity and reduction of NPH/Regular mix to 20 am 10 mg  SMBG  64-172    Fasting glucose 70's    Hemoglobin A1C (%)   Date Value   06/09/2022 6.0   11/25/2021 6.1 (H)   79/09/8331 6.1 (H)   Trulicity 8.32 mg weekly  NPH/Regular mix 20 am 10 pm    BP Readings from Last 3 Encounters:   06/30/22 (!) 148/104   06/27/22 (!) 162/90   06/20/22 (!) 130/97      Latest Reference Range & Units 05/20/21 14:44 11/25/21 14:01   Albumin (Micro), URN mg/dL 2.05 0.88   Albumin/Creatinine Ratio, URN <30 mg/gcreat 7 7     Enalapril 2.5 mg     06/20/2022 06/27/2022 06/30/2022   Vitals      Weight (pounds)  250 lb 12.8 oz !  250 lb !    Weight (kg)  113.762 kg !  113.399 kg !    BMI (Calculated)          Assessment: Plan   Diabetes: tolerating addition of trulicity.  cgm order in process through DME advanced diabetes supply  Trial off pm insulin due to glucose 70's in am with addition of trulicity,   Continue am insulin.  Low glucose precautions reviewed.   Trulicity every Monday. Titrate monthly if tolerating.  Arrange on site visit 1 week for CGM training.    BP above target;   Continue to monitor  Advise about NAS meal plan.    Prostate cancer: recently completed radiation tx.     Next Follow up:             09/22/2022 1:00 PM (Arrive by 12:45 PM) [facility]    09/22/2022 2:00 PM (Arrive by 1:45 PM) [name], MD [facility]

## 2022-07-28 ENCOUNTER — Ambulatory Visit
Admission: RE | Admit: 2022-07-28 | Discharge: 2022-07-28 | Disposition: A | Discharge status: Home / Self Care | Payer: Medicare HMO

## 2022-07-28 DIAGNOSIS — C61 Malignant neoplasm of prostate: Secondary | ICD-10-CM | POA: Insufficient documentation

## 2022-07-28 DIAGNOSIS — R972 Elevated prostate specific antigen [PSA]: Secondary | ICD-10-CM | POA: Insufficient documentation

## 2022-07-28 DIAGNOSIS — E119 Type 2 diabetes mellitus without complications: Secondary | ICD-10-CM | POA: Insufficient documentation

## 2022-07-28 DIAGNOSIS — E785 Hyperlipidemia, unspecified: Secondary | ICD-10-CM | POA: Insufficient documentation

## 2022-07-28 NOTE — Progress Notes (Signed)
Radiation Oncology Follow Up Visit     Diagnosis:  prostate cancer  Stage: high-risk  Prior RT:  SBRT to the prostate, SV and pelvic lymph nodes completed on 06/30/22.   Providers: Patient Care Team:  [name], MD as PCP - General (Internal Medicine)  Royston Bake, MD as Managed Care (Internal Medicine)  Eduard Roux, MD as Supervising Physician (Internal Medicine)  Debarah Crape, MD as Surgical Oncologist (Urology)  Brooks Sailors, MD as Radiation Oncologist (Radiation Oncology)  PCP: Mayme Genta, MD     CC:     Nathan [name] is a 72 year old male with PMHx of Type 2 DM, HLD and newly diagnosed high risk prostate cancer (iT3a/T3b, Gleason score 4+3=7, 11/12 cores positive, iPSA 28.33, prostate volume 36.6cc).  PSMA PET/CT showed localized disease with possible invasion into the Left SV base.  He is s/p SBRT to the prostate, SV and pelvic lymph nodes completed on 06/30/22 with concurrent ADT.       Interval History:     He is doing well. Urinry frequency and urgency and weak stream are all improvomg compared to the end of RT, and now he is 90% back to baseline. He has regular BM and denies diarrhea and constipation.     He is tolerating lupron well has had 2-3 hot flashes so far. He denies fatigue.     Urinary function:       04/23/2022     4:05 PM   AUA BPH    How often have you had a sensation of not emptying your bladder completely after you finished urinating? 1   How often have you had to urinate again less than two hours after you finished urinating? 1   How often have you stopped and started again several times when you urinated? 0   How often have you found it difficult to postpone urination? 0   How often have you had a weak urinary stream? 0   How often have you had to push or strain to begin urination? 0   How many times did you most typically get up to urinate from the time you went to bed at night until he time you got up in the morning? 2   If you were to spend the rest of your life  with your urinary condition the way it is now, how would you feel about that? 1   Total Symptom Score:   1-7 Mild   8-19 Moderate   20-35 Severe 4        Sexual function:      04/23/2022     4:05 PM   IIEF-5   When you had erections with sexual stimulation, HOW OFTEN were your erections hard enough for penetration? 1   During sexual intercourse, HOW OFTEN were you able to maintain your erection after you had penetrated (entered) your partner? 1   During sexual intercourse, HOW DIFFICULT was it to maintain your erection to completion of intercourse? 1   When you attempted sexual intercourse, HOW OFTEN was it satisfactory for you? 1      Prior RT:  See above     ROS: Negative except those detailed above.       PE:    Temp: 36.3 C  Pulse: 71  BP: 124/82  Resp: 16  SpO2: 98 %  Weight: 107.2 kg (236 lb 4.8 oz)     General  Well-appearing well-nourished, appears stated age, in  no acute distress.    HEENT  Normocephalic, atraumatic,anicteric. No eye/nose/ear discharge.     Neck  No gross lymphadenopathy. No goiter.    Lung  Normal respiratory effort, breathing comfortably on RA.    Abdomen   No apparent abdominal distention.    Extremities  No visible edema   Neuro  Moving all extremities spontaneously.     Psych  Attentive, conversant. Normal speech and affect.           Performance status: ECOG 0       Data Review:     Per HPI.     No results found.      Assessment/Plan:    Nathan [name] is a 72 year old male with PMHx of Type 2 DM, HLD and newly diagnosed high risk prostate cancer (iT3a/T3b, Gleason score 4+3=7, 11/12 cores positive, iPSA 28.33, prostate volume 36.6cc).  PSMA PET/CT showed localized disease with possible invasion into the Left SV base.  He is s/p SBRT to the prostate, SV and pelvic lymph nodes completed on 06/30/22 with concurrent ADT.      He is doing well with expected radiation side effects which are resolving. He is also tolerating lupron well.     Plan:  - RTC in 2 months with prior PSA and  testosterone.   - First Lupron 05/26/22, next due around 08/26/22.     Tilda Burrow, MD, PhD  Radiation Oncology      40 minutes were spent on the patient's care, which included  5 minutes in pre-clinic review of clinical records and independent review of imaging studies;  25 minutes counseling with the patient face-to-face and review of logistics/potential side effects of radiotherapy;  and 5 minutes in post-clinic documentation of clinical information in the electronic health record and care coordination.

## 2022-08-01 ENCOUNTER — Telehealth (HOSPITAL_BASED_OUTPATIENT_CLINIC_OR_DEPARTMENT_OTHER): Payer: Self-pay

## 2022-08-01 NOTE — Telephone Encounter (Signed)
RETURN CALL: Voicemail - Detailed Message      SUBJECT:  Appointment Request     REASON FOR VISIT: Per referral  PREFERRED DATE/TIME: N/A  REASON UNABLE TO APPOINT: Patient called to schedule from referral after receiving a text. Referral states clinic to schedule. Please call patient back.

## 2022-08-05 NOTE — Telephone Encounter (Signed)
Called patient, scheduled appointment, mailed appointment slip.

## 2022-08-15 ENCOUNTER — Other Ambulatory Visit (HOSPITAL_BASED_OUTPATIENT_CLINIC_OR_DEPARTMENT_OTHER): Payer: Self-pay

## 2022-08-15 NOTE — Progress Notes (Signed)
Follow up call to [facility].  Discontinued insulin after our last call 12/29  The plan was to continue 20 units qam.  Started trulicity 0.22 weekly  SMBG   Mostly in target.  Received CGM supplies.    Hemoglobin A1C (%)   Date Value   06/09/2022 6.0   11/25/2021 6.1 (H)   05/20/2021 6.1 (H)     A/p  Ok to trial off insulin,  Advised about next dose of trulicity.  Agrees to come in for CGM training.  Assist with titration of trulicity at next visit.  Arranged appointment when on site 1/29 with Ed Blalock.               08/18/2022 2:30 PM (Arrive by 2:15 PM) [facility]    08/18/2022 3:00 PM (Arrive by 2:45 PM) [facility]    08/26/2022 1:30 PM [facility]    08/26/2022 2:00 PM [facility]    09/22/2022 1:00 PM (Arrive by 12:45 PM) [facility]    09/22/2022 2:00 PM (Arrive by 1:45 PM) [name], MD [facility]    11/21/2022 1:40 PM (Arrive by 1:25 PM) COMPREHENSIVE UNASSIGNED [facility]

## 2022-08-18 ENCOUNTER — Ambulatory Visit: Payer: Medicare HMO | Attending: Internal Medicine | Admitting: Registered"

## 2022-08-18 ENCOUNTER — Ambulatory Visit (HOSPITAL_BASED_OUTPATIENT_CLINIC_OR_DEPARTMENT_OTHER): Payer: Medicare HMO

## 2022-08-18 VITALS — BP 112/77 | HR 86

## 2022-08-18 DIAGNOSIS — E1165 Type 2 diabetes mellitus with hyperglycemia: Secondary | ICD-10-CM | POA: Insufficient documentation

## 2022-08-18 DIAGNOSIS — Z794 Long term (current) use of insulin: Secondary | ICD-10-CM | POA: Insufficient documentation

## 2022-08-18 DIAGNOSIS — E119 Type 2 diabetes mellitus without complications: Secondary | ICD-10-CM

## 2022-08-18 NOTE — Progress Notes (Unsigned)
Nutrition Note    Clinic:  Adult Medicine    Note Type: Followup    Reason For Visit: Diabetes management   Referral Dr Franchot Erichsen 06/10/22    An interpreter was not needed for the visit.    Assessment    Patient Active Problem List   Diagnosis    Obesity, unspecified    Generalized osteoarthrosis, unspecified site    Other hyperlipidemia    Presbyopia    Gout    Irregular heart beat    Achilles tendinosis of right lower extremity    Mild intermittent asthma without complication    Healthcare maintenance    Non-seasonal allergic rhinitis    Subacute massive pulmonary embolism (HCC)    Type 2 diabetes mellitus with hyperglycemia, with long-term current use of insulin (HCC)    Syncope    PSA elevation    Wellness examination    Prostate cancer ([place])     Weight History:    Weight (pounds)   07/28/2022 236 lb 4.8 oz       Adult BMI Classification: obese categ. III (>40)     Weight (pounds)   06/30/2022 250 lb       Weight (pounds)   08/21/2021 245 lb 8 oz      Initial RD visit  Vitals Weight (lbs)   11/28/2020 248 lb     Labs:    DIABETES [facility] AMB    A1C   Latest Ref Rng 4.0 - 6.0 %   11/04/2020 17.5 (H)    05/20/2021 6.1 (H)    11/25/2021 6.1 (H)    06/09/2022 6.0      Self monitored blood glucose: Has [employer]- started today  From Glucometer  BG 89, 115, 92, 108, 94, 104  Hs  90, 137, 84, 90      Medications include:  70/30  decreased to 20am/ 10pm (from 35/20) 2/2 hypoglycemia (Dr Franchot Erichsen note 07/03/22 mentions stopping w/ initiation of Trulicity )  Trulicity .'75mg'$ /wk     Vitamins/Minerals/Herbal Supplements: not discussed    Previous: started using apple cider vinegar 1tsp every other day (friend told him it could help w/ weight and diabetes)    Barriers to adequate intake: none    GI tolerance: None, Per [name] no issues with cancer treatment    Activity:  Previously reported  fishing, some walking involved not as active since going through chemo/radiation for cancer    Food intolerances/allergies: None reported    Diet  History: not discussed 08/18/22- CGM- based visit  B: frosted cheerios  L: not often  D: 4-5p eats boiled chicken/turkey hotdog/fish,veg/ salad;  Red beans and rice, sandwiches   Snacks: potato chips/crackers and cheese  Beverages: zero sugar gatorade, cranberry juice- heard this was healthy    Previous  Stayed away from sausage and bacon 2/2 recommendations from provider in Oncology  Does not use salt  Snacks: pears, pecans/nuts  Drinking orange juice especially if he feels woozy- feels that it works better than glucose tablets  Evening snack: no longer buying sugar-free pie "I learned from you that the filling was not the issue but the crust"    Information from Patient: .     Previous  -Learning more about nutrition management helpful- better in small chunks of information  -Would like to know what he needs to do to not need insulin  -Pt had many questions about individual foods based on videos he'd seen on YouTube (e.g. sourdough, gluten, olive oil, tomatoes).  Assessment Summary: 72 year old male w/ history of asthma, mild CKD, gout PE, NSTEMI, HHS,  DM diagnosed 10/2020,  AKI, has been going through treatment for prostate CA,   .    Nutrition Diagnosis:   Previous  Food and nutrition related knowledge deficit evidenced by pt questions and report.  Pt benefits from repeated education regarding carbohydrate managed diet especially concept of carbohydrate vs sugar.   Diabetes management improved based on A1c and SMBG, continued lows especially in the evening - possibly d/t not eating midday.  change in medications 12/14 may help prevent hypos  - weight stable through cancer treatment.       Interventions and Patient Goals:  Meda Klinefelter 3 (08/18/28)     -Scan sensor every 8 hours per manufacturer (ideally every 6 hours)  - sensor good for 14 days though it may fail a couple of days sooner  - There is a difference between interstitial and blood glucose and associated time lag  - Significance of glucose trend  arrows and rate of change with each symbol- straight down arrow indicates glucose is falling rapidly at rate of '3mg'$ /dL/minute- expected drop of '30mg'$ /dL in 10 minutes  - Do not submerge >3 feet nor leave under water >30 minutes    Previous  Reviewed hypoglycemia treatment: If glucose is less than 70 ok to drink juice, should only need ~4oz    Education Provided:  Please refer to Education section of chart     Education materials provided:   Previous  Insulin dosage plan   My Food Plan  Insulin instructions in AVS    Time Spent with Patient: 30  mins    Plan/Recommendations    Follow up: 09/22/22    Plan for next visit: answer questions, review SMBG    Total Time Spent:  45 mins      Ed Blalock, MS, RD, CD, [facility]  Department mobile: [phone]. [phone]  Email: [email or web address]

## 2022-08-18 NOTE — Progress Notes (Signed)
Nurse Care Management Brief Note:    Visit type: Appointed    Location: [place]    The language the patient speaks: [place]   Interpreter present: None    Other(s) present:  NO    Summary: Follow up with Nathan [name] around visit with Ed Blalock.  Diabetes: stopped nph/regular insulin last month  BG 89, 115, 92, 108, 94, 104  Hs  90, 137  84, 90  Started trulicity 0.34 mg weekly. 12/15   06/09/2022 06/18/2022 06/20/2022 06/27/2022 06/30/2022   Vitals        Weight (pounds) 248 lb !    250 lb 12.8 oz !  250 lb !    Weight (kg) 112.492 kg !    113.762 kg !  113.399 kg !    BMI (Calculated) 38.92           07/28/2022   Vitals    Weight (pounds) 236 lb 4.8 oz !    Weight (kg) 107.185 kg !    BMI (Calculated)       Hemoglobin A1C (%)   Date Value   06/09/2022 6.0   11/25/2021 6.1 (H)   05/20/2021 6.1 (H)       Assessment: Plan   Diabetes, Stable SMBG off insulin.  Continue low dose trulicity.  Libre 3 sensor placed toay.  RTC 2 weeks to review date.      Next Follow up:             08/26/2022 1:30 PM [place] [facility]    08/26/2022 2:00 PM [facility]    09/01/2022 2:00 PM (Arrive by 1:45 PM) [facility]    09/22/2022 1:00 PM (Arrive by 12:45 PM) [facility]    09/22/2022 2:00 PM (Arrive by 1:45 PM) [name], MD [facility]    11/21/2022 1:40 PM (Arrive by 1:25 PM) COMPREHENSIVE UNASSIGNED [facility]

## 2022-08-21 ENCOUNTER — Other Ambulatory Visit (HOSPITAL_BASED_OUTPATIENT_CLINIC_OR_DEPARTMENT_OTHER): Payer: Self-pay | Admitting: Internal Medicine

## 2022-08-21 ENCOUNTER — Ambulatory Visit: Payer: Medicare HMO

## 2022-08-21 ENCOUNTER — Other Ambulatory Visit (HOSPITAL_COMMUNITY): Payer: Self-pay

## 2022-08-21 DIAGNOSIS — C61 Malignant neoplasm of prostate: Secondary | ICD-10-CM

## 2022-08-21 DIAGNOSIS — I10 Essential (primary) hypertension: Secondary | ICD-10-CM

## 2022-08-21 LAB — CBC (HEMOGRAM)
Hematocrit: 42 % (ref 38.0–50.0)
Hemoglobin: 14.5 g/dL (ref 13.0–18.0)
MCH: 29.5 pg (ref 27.3–33.6)
MCHC: 34.3 g/dL (ref 32.2–36.5)
MCV: 86 fL (ref 81–98)
Platelet Count: 274 10*3/uL (ref 150–400)
RBC: 4.92 10*6/uL (ref 4.40–5.60)
RDW-CV: 13 % (ref 11.0–14.5)
WBC: 5.14 10*3/uL (ref 4.3–10.0)

## 2022-08-21 LAB — COMPREHENSIVE METABOLIC PANEL
ALT (GPT): 17 U/L (ref 10–48)
AST (GOT): 17 U/L (ref 9–38)
Albumin: 4.4 g/dL (ref 3.5–5.2)
Alkaline Phosphatase (Total): 80 U/L (ref 36–161)
Anion Gap: 12 (ref 4–12)
Bilirubin (Total): 0.9 mg/dL (ref 0.2–1.3)
Calcium: 9.8 mg/dL (ref 8.9–10.2)
Carbon Dioxide, Total: 21 meq/L — ABNORMAL LOW (ref 22–32)
Chloride: 106 meq/L (ref 98–108)
Creatinine: 1.85 mg/dL — ABNORMAL HIGH (ref 0.51–1.18)
Glucose: 120 mg/dL (ref 62–125)
Potassium: 4.3 meq/L (ref 3.6–5.2)
Protein (Total): 7.7 g/dL (ref 6.0–8.2)
Sodium: 139 meq/L (ref 135–145)
Urea Nitrogen: 37 mg/dL — ABNORMAL HIGH (ref 8–21)
eGFR by CKD-EPI 2021: 39 mL/min/{1.73_m2} — ABNORMAL LOW (ref >59)

## 2022-08-21 LAB — PSA, TOTAL & REFLEXIVE FREE: PSA, Diagnostic/Monitoring: 0.18 ng/mL (ref 0.00–4.00)

## 2022-08-23 LAB — TESTOSTERONE: Testosterone: 11 ng/dL — ABNORMAL LOW (ref 264–916)

## 2022-08-26 ENCOUNTER — Ambulatory Visit: Payer: Medicare HMO

## 2022-08-26 ENCOUNTER — Ambulatory Visit (HOSPITAL_BASED_OUTPATIENT_CLINIC_OR_DEPARTMENT_OTHER): Payer: Medicare HMO

## 2022-08-26 VITALS — BP 142/72 | HR 56 | Temp 97.9°F | Resp 20

## 2022-08-26 DIAGNOSIS — R152 Fecal urgency: Secondary | ICD-10-CM | POA: Insufficient documentation

## 2022-08-26 DIAGNOSIS — Z5111 Encounter for antineoplastic chemotherapy: Secondary | ICD-10-CM | POA: Insufficient documentation

## 2022-08-26 DIAGNOSIS — R232 Flushing: Secondary | ICD-10-CM | POA: Insufficient documentation

## 2022-08-26 DIAGNOSIS — C61 Malignant neoplasm of prostate: Secondary | ICD-10-CM | POA: Insufficient documentation

## 2022-08-26 DIAGNOSIS — R350 Frequency of micturition: Secondary | ICD-10-CM | POA: Insufficient documentation

## 2022-08-26 MED ORDER — LEUPROLIDE ACETATE (3 MONTH) 22.5 MG IM KIT
22.5000 mg | PACK | Freq: Once | INTRAMUSCULAR | Status: AC
Start: 2022-08-26 — End: 2022-08-26
  Administered 2022-08-26: 22.5 mg via INTRAMUSCULAR
  Filled 2022-08-26: qty 1

## 2022-08-26 NOTE — Progress Notes (Signed)
Oncology Infusion Nurse Note    Subjective / Visit Information:    Reason for Visit: OP Infusion (Lupron)      Objective:   Vitals:    08/26/22 1249   Temp: 36.6 C   Pulse: (!) 56   BP: (!) 142/72   Resp: 20   SpO2: 99%       Medication Administrations This Visit         leuprolide (3 month) (Lupron Depot) injection 22.5 mg Admin Date  08/26/2022  12:53 Action  Given Dose  22.5 mg Route  Intramuscular Site  Left Ventrogluteal Documented By  Zipporah Plants, RN    Ordering Provider: Brooks Sailors, MD    [place]: [phone]                Discharge:   Patient tolerated treatment well and without complications.  Discharge disposition:   Accompanied by: Self  Discharged to: Home  Response: Aware of existing appointments, Aware of clinic contact information, Restates plan, and No further questions          Additional documentation may exist in Flowsheets or Education Activity

## 2022-08-29 ENCOUNTER — Ambulatory Visit
Admission: RE | Admit: 2022-08-29 | Discharge: 2022-08-29 | Disposition: A | Discharge status: Home / Self Care | Payer: Medicare HMO | Attending: Critical Care Medicine | Admitting: Critical Care Medicine

## 2022-08-29 DIAGNOSIS — J4520 Mild intermittent asthma, uncomplicated: Secondary | ICD-10-CM | POA: Insufficient documentation

## 2022-08-29 DIAGNOSIS — Z79899 Other long term (current) drug therapy: Secondary | ICD-10-CM | POA: Insufficient documentation

## 2022-08-29 LAB — PULMONARY FUNCTION TESTING
FEF25-75-%Pred-Pre: 58 %
FEF25-75-Pre: 1.16 L/sec
FEF25-75-Pred: 1.98 L/sec
FEV1-%Pred-Pre: 64 %
FEV1/FVC-Pre: 69 %
FVC-LLN: 2.68 L
FVC-Pre: 2.57 L
Oximetry Baseline: 97
PEF-Post: 145.7 L/min

## 2022-09-01 ENCOUNTER — Ambulatory Visit: Payer: Medicare HMO

## 2022-09-01 DIAGNOSIS — Z794 Long term (current) use of insulin: Secondary | ICD-10-CM | POA: Insufficient documentation

## 2022-09-01 DIAGNOSIS — E1165 Type 2 diabetes mellitus with hyperglycemia: Secondary | ICD-10-CM | POA: Insufficient documentation

## 2022-09-01 NOTE — Progress Notes (Signed)
Nurse Care Management Brief Note:    Visit type: Appointed    [facility]    The language the patient speaks: [place]   Interpreter present: None    Other(s) present:  NO    Summary: Follow up with [name] to [employer] 3 CGM reports  Cough with foamy phlegm for few months, noticed for 2-3 weeks.    Recent PFT's 2/9    Diabetes:  Hemoglobin A1C (%)   Date Value   06/09/2022 6.0   11/25/2021 6.1 (H)   [id number] 6.1 (H)     Trulicity A999333 mg weekly  Metformin 500 mg 2 qd  Download libre 3 CGM from libreview.com       06/27/2022 06/30/2022 07/28/2022   Vitals      Weight (pounds) 250 lb 12.8 oz !  250 lb !  236 lb 4.8 oz !    Weight (kg) 113.762 kg !  113.399 kg !  107.185 kg !       Legend:  ! Abnormal      DIABETIC FOOT SCREEN     Right Foot Left Foot   Exam findings Palpated pedal pulses, sensate to 10 gm monofilament , mild non pitting edema Same as right   Weakness in the ankle or foot: N/A N/A   Nails thick, too long, or ingrown: no NO   Brachial Systolic Pressure:     Ankle Systolic Pressure:     Ischemic Index:     Does patient use appropriate footware? YES YES   Risk Category:* 0 0       *Risk Category  0 - No loss of protective sensation   1 - Loss of protective sensation (no weakness, deformity, callus, pre-ulcer or callus, but no hx of ulceration)   2 - Loss of protective sensation with  weakness, deformity, callus, pre-ulcer or callus, but no hx of ulceration   3 - History of plantar ulceration     Exam performed by Rhunette Croft, RN     Assessment: Plan   Diabetes: at goal by time in range on CGM and A1C  Continue dietary modifications and safe physical activity.    Arrange follow up when on site in March  Call sooner for questions/concerns/    Declined covid and flu vaccine.  Continue to explore barriers.    Next Follow up:             09/22/2022 1:00 PM (Arrive by 12:45 PM) [facility]    09/22/2022 1:30 PM (Arrive by 1:15 PM) [place]  [facility]    09/22/2022 2:00 PM (Arrive by 1:45 PM) [name], MD [facility]    11/21/2022 1:40 PM (Arrive by 1:25 PM) COMPREHENSIVE UNASSIGNED [facility]

## 2022-09-11 LAB — PULMONARY FUNCTION TESTING
FEF25-75-%Change-Post: 20 %
FEF25-75-Post: 1.4 L/sec
FEV1-Post: 1.76 L
FEV1/FVC-%Change-Post: -9 %
FEV1/FVC-%Pred-Post: 80 %
FVC-Post: 2.83 L
PEF-%Change-Post: -39 %
Total Distance Walked: 900

## 2022-09-16 LAB — PULMONARY FUNCTION TESTING
EXPTIME-%Change-Post: -7 %
EXPTIME-Post: 7.46 s
EXPTIME-Pre: 8.05 s
FEF25-75-%Pred-Post: 70 %
FEF25-75-LLN: 0.72 L/sec
FEV1-%Change-Post: 0 %
FEV1-%Pred-Post: 64 %
FEV1-LLN: 2.01 L
FEV1-Pre: 1.77 L
FEV1-Pred: 2.75 L
FEV1/FVC-%Pred-Pre: 88 %
FEV1/FVC-LLN: 64 %
FEV1/FVC-Post: 62 %
FEV1/FVC-Pred: 77 %
FVC-%Change-Post: 7 %
FVC-%Pred-Post: 78 %
FVC-%Pred-Pre: 71 %
FVC-Pred: 3.6 L
Oximetry End of Test: 90
Oximetry Lowest: 90
PEF-Pre: 241.4 L/min
Pulse Rate Baseline: 66
Pulse Rate End of Test: 93
Pulse Rate Highest: 93
Total Minutes Walked: 6

## 2022-09-21 NOTE — Progress Notes (Unsigned)
Adult Medicine Clinic - Outpatient Return Clinic Note    Date:  09/22/2022    Nathan [name] is a 72 year old male who is here today for   Chief Complaint   Patient presents with    Wellness     INTERVAL HISTORY/HPI:    Nathan [name] is a 72 year old man hospitalized 4/14 - 11/05/20 for syncope, PE, NSTEMI, Hyoxic resp failure, HHS, Newly diagnosed DM, Asthma, AKI/CKD, and gout.      Today Nathan [name] reports that he has a dry cough, occasional sputum, mouth feels dry, left arm hurts after COVID shot.      He has had visits with Vaughan Basta and [name].  He is on Trulicity and Metformin and using a Libre 3 sensor.  NPH and regular have been stopped.      He reports taking Eliquis and Enalapril but is somewhat unsure of the doses.      He is receiving treatment with Lupron (Leuprolide) negative feedback LHRH agonist, 22.5 mg q 3 months.      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 Mixed type cataracts, both eyes  - mild visual significance left eye  - monitor   3.    Presbyopia  - Discussed with patient the need for reading glasses as he ages.  He understands and is happy to try reading glasses  - recommend +2.00 to +2.50 OTC readers"      Allergies: Grass and cherry blossoms. He reports they have been kicking in late November. Not allergic to ASA       SH:  He has been staying at home.  He lives at home alone, likes to fish in the warmer months.  He states he believes in [place], and lives on a fixed income.  He likes to "speak when spoken to".        PROBLEM LIST: See Epic Problem List    Patient Active Problem List   Diagnosis    Obesity, unspecified    Generalized osteoarthrosis, unspecified site    Other hyperlipidemia    Presbyopia    Gout    Irregular heart beat    Achilles tendinosis of right lower extremity     Mild intermittent asthma without complication    Healthcare maintenance    Non-seasonal allergic rhinitis    Subacute massive pulmonary embolism (HCC)    Type 2 diabetes mellitus with hyperglycemia, with long-term current use of insulin (HCC)    Syncope    PSA elevation    Wellness examination    Prostate cancer [facility])     MEDICATIONS:    Current Outpatient Medications:     acetaminophen 500 MG tablet, Take 2 tablets (1,000 mg) by mouth every 6 hours as needed for pain. (Patient not taking: Reported on 04/07/2022), Disp: 20 tablet, Rfl: 0    Albuterol Sulfate HFA 108 (90 Base) MCG/ACT Inhalation Aero Soln, Inhale 2 puffs by mouth every 6 hours as needed for shortness of breath/wheezing., Disp: 1 Inhaler, Rfl: 6    allopurinol 100 MG tablet, Take 2 tablets (200 mg) by mouth daily., Disp: 180 tablet, Rfl: 0    apixaban (Eliquis) 2.5 MG tablet, Take 1 tablet (2.5 mg) by mouth 2 times a day., Disp: 60 tablet, Rfl: 3  atorvastatin 40 MG tablet, Take 1 tablet (40 mg) by mouth every evening. To lower cholesterol., Disp: 90 tablet, Rfl: 0    blood glucose test strip, Use 1 strip 4 times a day. Use to check blood sugar., Disp: 400 strip, Rfl: 4    ciprofloxacin 500 MG tablet, Take 1 tablet (500 mg) by mouth 2 times a day. Continue taking until it is gone., Disp: 3 tablet, Rfl: 0    dulaglutide (Trulicity) A999333 0000000 pen-injector, Inject 0.5 mL (0.75 mg) under the skin one time a week. 4 once weekly doses provided, Disp: 2 mL, Rfl: 1    enalapril 5 MG tablet, Take 0.5 tablets (2.5 mg) by mouth daily., Disp: 30 tablet, Rfl: 2    Fluticasone Propionate HFA 110 MCG/ACT Inhalation Aerosol, Inhale 1 puff by mouth every 12 hours., Disp: 1 Inhaler, Rfl: 6    glucometer (OneTouch Verio Flex System) w/Device kit, Use to check blood sugar 4 times a day., Disp: 1 kit, Rfl: 6    glucose 4 g chewable tablet, Chew and swallow 4 tablets (16 g) by mouth every 15 minutes as needed for low blood sugar. Recheck blood sugar as needed.,  Disp: 50 tablet, Rfl: 3    ibuprofen 400 MG tablet, Take 1 tablet (400 mg) by mouth every 6 hours as needed for pain., Disp: 20 tablet, Rfl: 0    ketoconazole 2 % shampoo, apply topically face and scalp if needed, Disp: 120 mL, Rfl: 1    metFORMIN 500 MG tablet, Take 1 tablet (500 mg) by mouth 2 times a day with meals., Disp: 180 tablet, Rfl: 0    Triamcinolone Acetonide 0.1 % External Ointment, Apply to affected area on neck 2 times a day., Disp: 15 g, Rfl: 0    ALLERGIES:  Review of patient's allergies indicates:  No Known Allergies    PHYSICAL EXAM:    BP 122/68   Pulse 60   Temp 36.5 C (Temporal)   Resp 18   Ht '5\' 7"'$  (1.702 m)   Wt (!) 104.8 kg (231 lb)   SpO2 97%   BMI 36.18 kg/m     NAD  Alert and interactive  Lungs clear without wheezing  COR RRR w/o  Flat polypoid skin tag on mid- low back 5 mm  (cryotherapy applied) - resolved.  LE with compression stocking, no significant edema.      LAB AND IMAGING RESULTS:  Orders Only on 08/21/22   1. Testosterone   Result Value Ref Range    Testosterone 11 (L) 264 - 916 ng/dL    Testosterone by LCMS Method       This test was developed and its performance characteristics determined by the [facility] Department of Laboratory Medicine and Pathology. It has not been cleared or approved by the [place]   Food and Drug Administration.     2. Psa, Total & Reflexive Free   Result Value Ref Range    PSA, Diagnostic/Monitoring 0.18 0.00 - 4.00 ng/mL   3. Comprehensive Metabolic Panel   Result Value Ref Range    Sodium 139 135 - 145 meq/L    Potassium 4.3 3.6 - 5.2 meq/L    Chloride 106 98 - 108 meq/L    Carbon Dioxide, Total 21 (L) 22 - 32 meq/L    Anion Gap 12 4 - 12    Glucose 120 62 - 125 mg/dL    Urea Nitrogen 37 (H) 8 - 21 mg/dL    Creatinine 1.85 (  H) 0.51 - 1.18 mg/dL    Protein (Total) 7.7 6.0 - 8.2 g/dL    Albumin 4.4 3.5 - 5.2 g/dL    Bilirubin (Total) 0.9 0.2 - 1.3 mg/dL    Calcium 9.8 8.9 - 10.2 mg/dL    AST (GOT) 17 9 - 38 U/L    Alkaline  Phosphatase (Total) 80 36 - 161 U/L    ALT (GPT) 17 10 - 48 U/L    eGFR by CKD-EPI 2021 39 (L) >59 mL/min/1.73_m2   4. CBC   Result Value Ref Range    WBC 5.14 4.3 - 10.0 10*3/uL    RBC 4.92 4.40 - 5.60 10*6/uL    Hemoglobin 14.5 13.0 - 18.0 g/dL    Hematocrit 42 38.0 - 50.0 %    MCV 86 81 - 98 fL    MCH 29.5 27.3 - 33.6 pg    MCHC 34.3 32.2 - 36.5 g/dL    Platelet Count 274 150 - 400 10*3/uL    RDW-CV 13.0 11.0 - 14.5 %     ASSESSMENT AND PLAN:    Diabetes:  Trulicity A999333 mg sc q wk and metformin 500 mg bid  Foot Exam - 11/26/20, 07/04/21, 11/25/21 completed.   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  Mixed type cataracts, both eyes, mild. Monitor. Next visit 11/20/22.  Alb/Creat = 7 (11/25/21)       A1C [facility] AMB    A1C   Latest Ref Rng 4.0 - 6.0 %   11/04/2020 17.5 (H)    05/20/2021 6.1 (H)    11/25/2021 6.1 (H)    06/09/2022 6.0       2. 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  He is currently taking 2.5 mg daily and the script is written for 2.5 mg bid.       3. Asthma.  He uses -Fluticasone 110 mcg BID  -Albuterol Q6hr PRN and has a stable pattern. PFTs (08/29/22):  FEV1/FVC 88%, FEVI 64% predicted.  Interpretation:  Narrative & Impression: No obstructive disorder. Spirometry suggests a possible mild restrictive disorder.        4. Cardiac Risk Factors.  BP 124/62 off meds, BMI 47, LDL 96, DM no, No FH CAD, No tobacco. No MI or Stroke.  EKG (11/01/21): NSR, Axis - 52 (LAD), PR N, QRS 168, QTc 459, RBBB, LAHB, No Q-waves. ASCVD Risk 9.8%.         Cholesterol     LDL Cholesterol   Latest Ref Rng <130 mg/dL   11/01/2020 107    05/20/2021 62    06/09/2022 74       5. HTN. Enalapril 2.5 mg daily      Vitals     Systolic Diastolic   [id number]  77    08/26/2022 142 !  72 !    09/22/2022 122  68       6. Cardiac Function     Cards note 11/01/20: MICU for hypotension and respiratory failure found to  have troponin elevation of 0.17-->1.6 found to have new severe RV dilation and systolic dysfunction found to have large PE. Also considered primary ACS however patient is not in any chest pain, likely location the right sided or posterior however EKG is not presently suggestive of ACS in such anatomic distribution. Patient does have risk factors for ACS including diabetes, obesity, and tobacco history. Given that he  has large PE with RV dysfunction I suspect his troponin is secondary to PE.     11/01/20: ECHO: Conclusion  1. The left ventricle is normal in size. There is normal left ventricular  wall thickness. Global left ventricular function is normal (biplane EF 68%).  No regional wall motion abnormalities are present.  2. The right ventricle is severely dilated. The right ventricular systolic  function is severely decreased. Right ventricular segmental wall motion  abnormalities are present with decreased function of the mid segment. The  right atrium is mildly dilated.  3. Normal valve structure and function.  4. The pulmonary artery systolic pressure is borderline elevated at 32 mmHg.  5, There is no pericardial effusion.     08/06/21: ECHO:  Conclusion: Normal LV size with mildly reduced systolic function. Regional wall motion abnormalities shown below.  Normal RV size and systolic function. Normal RA pressure. Could not evaluate PA pressure. RV/LV ratio 0.7 which is normal. Normal valve structure and function.  Compared with prior study from 4/14//22, RV size and systolic function has improved.      6. CKD (Grade 3a).  Stable pattern over time.   Creatinine 1.60 - 1.45 mg/dL    GFR 44 - 56 ml/min (as of 06/09/22)  Prot / Creat < 0.1 (02/22/16)   Alb/Creat 7 (05/20/21)      Renal Function Testing    Creatinine   Latest Ref Rng 0.51 - 1.18 mg/dL   02/14/2022 1.6 (H)    05/26/2022 1.5 !    06/09/2022 1.57 (H)    08/21/2022 1.85 (H)       7.  Prostate Cancer Screening:  No FH of prostate CA.   He elects to obtain a PSA  after SDM.  (07/04/21)  PSA 28.19 (referral sent on 07/10/21, patients questions were addressed but closed.)  11/25/21:  Again SDM today with patient agreement to proceed with referral to Urology.  He is undergoing therapy for prostate cancer (XRT and Lupron).      PSA [facility] AMB    Prostate Specific Antigen   Latest Ref Rng 0.00 - 4.00 ng/mL   07/08/2021 28.19 (H)    11/25/2021 23.06 (H)    01/09/2022 28.33 (H)    08/21/2022 0.18        8. Chronic gout.  He report intermittent foot pain but is non-specific on interview.  Records indicate last flare podagra (L great toe) in June 2017.  He is minimizing red meat, and no alcohol.  He has CKD therefore goal is to use tylenol over NSAIDs for pain.  He is on Allopurinol 200 mg daily, Colchicine 0.6 mg 2 tabs for flare, then 1 tab 1 hr later.  Uric Acid 7.4 (07/07/18).  CrCl at 50 mL/min, allopurinol and colchicine doses acceptable.  ASA has been stopped due to gout and CKD.  (stable 11/26/20)     9. Tinea Capitus.  Ketoconazole shampoo      10.  Derm.  He has multiple skin tags.  Cryotherapy provided today.       11.  Social:  He has no DPOA.  But his brother Elta Guadeloupe who lives in [place] calls and checks on him the most.  He would want Elta Guadeloupe involved if her were unable to make decisions for himself.         HEALTH CARE MAINTANENCE / SCREENING:  (See below)      PHQ 2 Screening:         PHQ-9  PHQ2 Total Score   07/11/2019 0    04/23/2022 0    06/30/2022 0       Colorectal Cancer Screening:  FIT negative (07/23/18 and 07/10/21). Patient declines colonoscopy.  2017 - 07/10/21: FIT Negative x 5.  FIT ordered 09/22/22.      Tobacco Use:  Quit at age 28.  ETOH Use:  Occ use   Marijuana:  Occ      Hepatitis C / HIV Screening:  HIV negative 2013.  Hep C (1945-65 + high risk): Neg 2013       He declines a flu vaccine. Bivalent COVID booster. (07/04/21).        Declines COVID and Flu.  RSV at the local pharmacy.  He declines all today.  (06/09/22).    Immunization History   Administered Date(s)  Administered    COVID-19 Moderna mRNA monovalent 12 yrs and older 09/20/2019, 10/18/2019    COVID-19 Pfizer mRNA bivalent 12 yrs and older 07/04/2021    COVID-19 Pfizer mRNA monovalent tris-sucrose 12 yrs and older (gray cap) 11/26/2020    Hepatitis B, unspecified 10/05/2007, 11/05/2007, 05/08/2008    Influenza quadrivalent PF 04/19/2013, 06/12/2014, 05/01/2015    Influenza quadrivalent adjuvanted (Fluad) 05/19/2019    Influenza trivalent PF 07/07/2012    Influenza, unspecified 09/26/2010    Pneumococcal polysaccharide PPSV23 (Pneumovax 23) 02/12/2009    Tdap 01/21/2011     Future Appointments   Date Time Provider [name]   09/22/2022  1:00 PM [place] AM   09/22/2022  1:30 PM H042 CARE MANAGEMENT [facility]   09/22/2022  2:00 PM [name], MD H Adlt20 [facility] AM   11/21/2022  1:40 PM COMPREHENSIVE UNASSIGNED H Op[facility]20 [facility] EYE/ENT     F/U Dr. Franchot Erichsen in 4 months.    Total time spent in the direct care of this patient and in reviewing records and documentation on the date of service was 35 minutes.

## 2022-09-22 ENCOUNTER — Ambulatory Visit (HOSPITAL_BASED_OUTPATIENT_CLINIC_OR_DEPARTMENT_OTHER): Payer: Medicare HMO

## 2022-09-22 ENCOUNTER — Ambulatory Visit: Payer: Medicare HMO | Attending: Internal Medicine | Admitting: Internal Medicine

## 2022-09-22 ENCOUNTER — Ambulatory Visit (HOSPITAL_BASED_OUTPATIENT_CLINIC_OR_DEPARTMENT_OTHER): Payer: Medicare HMO | Admitting: Registered"

## 2022-09-22 VITALS — BP 122/68 | HR 60 | Temp 97.7°F | Resp 18 | Ht 67.0 in | Wt 231.0 lb

## 2022-09-22 DIAGNOSIS — E119 Type 2 diabetes mellitus without complications: Secondary | ICD-10-CM | POA: Insufficient documentation

## 2022-09-22 DIAGNOSIS — Z713 Dietary counseling and surveillance: Secondary | ICD-10-CM

## 2022-09-22 DIAGNOSIS — Z794 Long term (current) use of insulin: Secondary | ICD-10-CM | POA: Insufficient documentation

## 2022-09-22 DIAGNOSIS — E1165 Type 2 diabetes mellitus with hyperglycemia: Secondary | ICD-10-CM | POA: Insufficient documentation

## 2022-09-22 DIAGNOSIS — Z0000 Encounter for general adult medical examination without abnormal findings: Secondary | ICD-10-CM | POA: Insufficient documentation

## 2022-09-22 MED ORDER — METFORMIN HCL 500 MG OR TABS
500.0000 mg | ORAL_TABLET | Freq: Two times a day (BID) | ORAL | 0 refills | Status: DC
Start: 2022-09-22 — End: 2022-11-12

## 2022-09-22 MED ORDER — TRULICITY 0.75 MG/0.5ML SC SOAJ
0.7500 mg | SUBCUTANEOUS | 1 refills | Status: DC
Start: 2022-09-22 — End: 2022-11-12

## 2022-09-22 NOTE — Patient Instructions (Addendum)
Today we confirmed that you diabetes management is going well.    You are mentioning a mild cough, which seems well managed today.     I have placed a request for an annual FIT study.      I will call you to go over your medications.      Please call [facility] for CO-PAY ASSISTANCE for glucose sensors.        I will see you again in 4 months.

## 2022-09-22 NOTE — Progress Notes (Unsigned)
Nutrition Note    Clinic:  Adult Medicine    Note Type: Followup    Reason For Visit: Diabetes management   Referral Dr Franchot Erichsen 06/10/22    An interpreter was not needed for the visit.    Assessment    Patient Active Problem List   Diagnosis    Obesity, unspecified    Generalized osteoarthrosis, unspecified site    Other hyperlipidemia    Presbyopia    Gout    Irregular heart beat    Achilles tendinosis of right lower extremity    Mild intermittent asthma without complication    Healthcare maintenance    Non-seasonal allergic rhinitis    Subacute massive pulmonary embolism (HCC)    Type 2 diabetes mellitus with hyperglycemia, with long-term current use of insulin (HCC)    Syncope    PSA elevation    Wellness examination    Prostate cancer ([place])     Weight History:    Weight (pounds) BMI (Calculated)   09/22/2022 231 lb  36.26      Adult BMI Classification: obese categ. III (>40)     Weight (pounds)   07/28/2022 236 lb 4.8 oz       Weight (pounds)   06/30/2022 250 lb       Weight (pounds)   08/21/2021 245 lb 8 oz      Initial RD visit  Vitals Weight (lbs)   11/28/2020 248 lb     Labs:    DIABETES [facility] AMB    A1C   Latest Ref Rng 4.0 - 6.0 %   11/04/2020 17.5 (H)    05/20/2021 6.1 (H)    11/25/2021 6.1 (H)    06/09/2022 6.0      Self monitored blood glucose:      Medications include:  Trulicity .'75mg'$ /wk   Metformin '500mg'$  bid    Vitamins/Minerals/Herbal Supplements: not discussed    Previous: started using apple cider vinegar 1tsp every other day (friend told him it could help w/ weight and diabetes)    Barriers to adequate intake: none    GI tolerance: None, Per [name] no issues with cancer treatment    Activity:  Previously reported  fishing, some walking involved not as active since going through chemo/radiation for cancer    Food intolerances/allergies: None reported    Diet History:   Notes decreased intake/appetite  No red meat since ~1980's, eggs none since ~Jan 2024, likes chicken, salmon  Stays away from processed meats:  hot links, hot dogs, sausage, bacon  Eats salad first when he gets a pizza "does not get all the time"  Steamed broccoli and cauliflower-  cornish game hen ;seasoned with lemon pepper- sodium- free,  Listened to doctor on line who gave lots of nutrition information regarding dementia, health (stay away from ripe bananas, cook with avocado oil vs olive oil)    Previous  B: frosted cheerios  L: not often  D: 4-5p eats boiled chicken/turkey hotdog/fish,veg/ salad;  Red beans and rice, sandwiches   Snacks: potato chips/crackers and cheese  Beverages: zero sugar gatorade, cranberry juice- heard this was healthy  Does not use salt    Information from Patient: .   Hayfever starting, struggling with cough    Previous  -Learning more about nutrition management helpful- better in small chunks of information  -Would like to know what he needs to do to not need insulin  -Pt had many questions about individual foods based on videos he'd seen on YouTube (e.g.  sourdough, gluten, olive oil, tomatoes).     Assessment Summary: 72 year old male w/ history of asthma, mild CKD, gout PE, NSTEMI, HHS,  DM diagnosed 10/2020,  AKI, has been going through treatment for prostate CA,   .    Nutrition Diagnosis:   Improved diabetes based on SMBG.  Continued weight loss since starting on Ozempic, appears to be eating adequate food    Previous  Food and nutrition related knowledge deficit evidenced by pt questions and report.  Pt benefits from repeated education regarding carbohydrate managed diet especially concept of carbohydrate vs sugar.   Diabetes management improved based on A1c and SMBG, continued lows especially in the evening - possibly d/t not eating midday.  change in medications 12/14 may help prevent hypos  - weight stable through cancer treatment.       Interventions and Patient Goals:  Commended Nathan [name] on his efforts, support based visit    Previous  Reviewed hypoglycemia treatment: If glucose is less than 70 ok to drink juice,  should only need ~4oz    Education Provided:  Please refer to Education section of chart     Education materials provided:   Previous  Insulin dosage plan   My Food Plan  Insulin instructions in AVS    Time Spent with Patient: 30  mins    Plan/Recommendations    Follow up: 10/20/22    Plan for next visit: answer questions, review SMBG    Total Time Spent:  45 mins      Ed Blalock, MS, RD, CD, [facility]  Department mobile: [phone]. [phone]  Email: [email or web address]

## 2022-09-22 NOTE — Progress Notes (Signed)
Nurse Care Management Brief Note:    Visit type: Appointed    Location: [place]    The language the patient speaks: [place]   Interpreter present: None    Other(s) present:  YES:  Dr. Franchot Erichsen    Summary: Follow up with Nathan [name] around visit with Dr. Franchot Erichsen.  [place] 3 reader, reviewed glucose report, and scanned into media                  Trulicity A999333 mg weekly  Metformin 500 mg bid      BP Readings from Last 3 Encounters:   09/22/22 122/68   08/26/22 (!) 142/72   08/18/22 112/77     Enalapril 2.5 mg      Prostate cancer: completed radiation, receiving lupron injections at [place] Reference Range & Units 01/09/22 10:50 08/21/22 15:11   Prostate Specific Antigen 0.00 - 4.00 ng/mL 28.33 (H) 0.18       Assessment: Plan   Diabetes: time in range 123XX123  Tolerating trulicity without s/e  Prefers cgm sensor over BGM  Arrange follow up 4-6 weeks    Next follow up              10/20/2022 2:00 PM (Arrive by 1:45 PM) [facility]    10/20/2022 2:30 PM (Arrive by 2:15 PM) [facility]    11/21/2022 1:40 PM (Arrive by 1:25 PM) COMPREHENSIVE UNASSIGNED [facility]    01/05/2023 2:00 PM (Arrive by 1:45 PM) [name], MD [facility]

## 2022-09-22 NOTE — Progress Notes (Signed)
Fecal Occult Blood Test    Ordered by: Mayme Genta, MD  Primary Learner: Patient  Challenges: None  Motivation of Learning: Engaging with Education  Method of Teaching: Discussion with Patient  Topic Taught: Fecal Occult Blood test x 1 to take home, instructed patient to turn in specimen as soon as possible, Exp.  Post Education Response: Patient states understanding  Total time spent teaching: 5 minutes  Patient tolerated it well. Patient left without any complication.

## 2022-09-24 ENCOUNTER — Telehealth (HOSPITAL_BASED_OUTPATIENT_CLINIC_OR_DEPARTMENT_OTHER): Payer: Self-pay

## 2022-09-24 NOTE — Telephone Encounter (Signed)
RN received call from pt wants to talk to [employer] . RN advised pt that Vaughan Basta does not work on Wednesdays. Pt asked RN to send message to Vaughan Basta to call him back in regards to his diabetic patch.   Phone appt scheduled for pt with RN Vaughan Basta tomorrow at 2 pm, pt verbalized understanding.    Note forwarded to [employer].

## 2022-09-25 ENCOUNTER — Ambulatory Visit: Payer: Medicare HMO

## 2022-09-25 DIAGNOSIS — E1165 Type 2 diabetes mellitus with hyperglycemia: Secondary | ICD-10-CM

## 2022-09-25 NOTE — Progress Notes (Signed)
Nurse Care Management Brief Note:    Visit type: Appointed    Location: phone    The language the patient speaks: [place]   Interpreter present: None    Other(s) present:  NO    Summary: Phone follow up with Nathan [name] re: bill for CGM sensors from [employer].  We did 3 way call and left voice message to call Nathan [name] .      Assessment: Plan   Billing concerns around CGM copays.  Agrees to follow up next week if not resolved.  Advised to apply for copay assist through [employer].      Next Follow up:             10/20/2022 2:00 PM (Arrive by 1:45 PM) [facility]    10/20/2022 2:30 PM (Arrive by 2:15 PM) [facility]    11/21/2022 1:40 PM (Arrive by 1:25 PM) COMPREHENSIVE UNASSIGNED [facility]    01/05/2023 2:00 PM (Arrive by 1:45 PM) [name], MD [facility]

## 2022-10-08 ENCOUNTER — Other Ambulatory Visit (HOSPITAL_BASED_OUTPATIENT_CLINIC_OR_DEPARTMENT_OTHER): Payer: Self-pay | Admitting: Internal Medicine

## 2022-10-08 ENCOUNTER — Ambulatory Visit: Payer: Medicare HMO | Attending: Internal Medicine

## 2022-10-08 DIAGNOSIS — Z0000 Encounter for general adult medical examination without abnormal findings: Secondary | ICD-10-CM | POA: Insufficient documentation

## 2022-10-08 LAB — OCCULT BLOOD BY IA, STL: Occult Bld 1 Result: NEGATIVE

## 2022-10-16 ENCOUNTER — Telehealth (HOSPITAL_BASED_OUTPATIENT_CLINIC_OR_DEPARTMENT_OTHER): Payer: Self-pay | Admitting: Internal Medicine

## 2022-10-16 NOTE — Telephone Encounter (Signed)
RETURN CALL: Voicemail - Detailed Message      SUBJECT:  General Message     MESSAGE: Patient called in wanting to speak with someone named Bonita Quin in regards to his Glucose Monitor patch. Stated he was told to reach out to her. Call back as soon as possible.

## 2022-10-20 ENCOUNTER — Ambulatory Visit: Payer: Medicare HMO | Attending: Internal Medicine

## 2022-10-20 ENCOUNTER — Ambulatory Visit (HOSPITAL_BASED_OUTPATIENT_CLINIC_OR_DEPARTMENT_OTHER): Payer: Medicare HMO | Admitting: Registered"

## 2022-10-20 VITALS — Ht 67.0 in | Wt 231.0 lb

## 2022-10-20 DIAGNOSIS — Z713 Dietary counseling and surveillance: Secondary | ICD-10-CM

## 2022-10-20 DIAGNOSIS — E119 Type 2 diabetes mellitus without complications: Secondary | ICD-10-CM

## 2022-10-20 DIAGNOSIS — Z794 Long term (current) use of insulin: Secondary | ICD-10-CM | POA: Insufficient documentation

## 2022-10-20 DIAGNOSIS — E1165 Type 2 diabetes mellitus with hyperglycemia: Secondary | ICD-10-CM | POA: Insufficient documentation

## 2022-10-20 NOTE — Progress Notes (Addendum)
Nutrition Note    Clinic:  Adult Medicine    Note Type: Followup    Reason For Visit: Diabetes management   Referral Dr Renaldo Harrison 06/10/22    An interpreter was not needed for the visit.    Assessment    Patient Active Problem List   Diagnosis    Obesity, unspecified    Generalized osteoarthrosis, unspecified site    Other hyperlipidemia    Presbyopia    Gout    Irregular heart beat    Achilles tendinosis of right lower extremity    Mild intermittent asthma without complication    Healthcare maintenance    Non-seasonal allergic rhinitis    Subacute massive pulmonary embolism (HCC)    Type 2 diabetes mellitus with hyperglycemia, with long-term current use of insulin (HCC)    Syncope    PSA elevation    Wellness examination    Prostate cancer (HCC)     Weight History:    Weight (pounds) BMI (Calculated)   10/20/2022 231 lb  36.26      Adult BMI Classification: obese categ. II (35- 39.9)    Last RD visit   Weight (pounds)   09/22/2022 231 lb      Initial RD visit  Vitals Weight (lbs)   11/28/2020 248 lb     Labs:    DIABETES [facility]    A1C   Latest Ref Rng 4.0 - 6.0 %   11/04/2020 17.5 (H)    05/20/2021 6.1 (H)    11/25/2021 6.1 (H)    06/09/2022 6.0      Self monitored blood glucose:          Medications include:  Trulicity . /wk   Metformin  bid    Vitamins/Minerals/Herbal Supplements: not discussed    Previous: started using apple cider vinegar 1tsp every other day (friend told him it could help w/ weight and diabetes)    Barriers to adequate intake: none    GI tolerance: None, Per Nathan [name] no issues with cancer treatment    Activity:  Previously reported  fishing, some walking involved not as active since going through chemo/radiation for cancer    Food intolerances/allergies: None reported    Diet History:   Wants to know if he can have a milkshake.  He makes them at home and has not had one for over a year.  Previoulsy ~4 scoops ice cream and almond milk "thinks about it every few months but not really craving" .   Acknowledges previous portions too much.   This morning [place] sandwich (cheese, mustard, lettuce, mayo) buys prepared and also likes bbq ham sandwich  Got blueberry yogurt and cranberry juice from cafeteria today around noon.  His glucose was ~ /dL on CGM about 45 minutes after this  Really like Tazo tea recommended by Nathan [name]  Eating more pears sprinkled with lemon pepper  Not eating pizza as often-  likes [employer] chocolate milk from [employer]- does not drink all the time    Previous  No red meat since ~1980's, eggs none since ~Jan 2024, likes chicken, salmon  B: frosted cheerios  L: not often  D: 4-5p eats boiled chicken/turkey hotdog/fish,veg/ salad;  Red beans and rice, sandwiches   Snacks: potato chips/crackers and cheese  Beverages: zero sugar gatorade, cranberry juice- heard this was healthy  Does not use salt    Information from Patient:   Collects matchbox cars  Fishing season about to start (~ beginning of May)  Previous  -Learning more about nutrition management helpful- better in small chunks of information  -Would like to know what he needs to do to not need insulin  -Pt had many questions about individual foods based on videos he'd seen on YouTube (e.g. sourdough, gluten, olive oil, tomatoes).     Assessment Summary: 72 year old male w/ history of asthma, mild CKD, gout PE, NSTEMI, HHS,  DM diagnosed 10/2020,  AKI, has been going through treatment for prostate CA,   .    Nutrition Diagnosis:   Improved diabetes based on SMBG and A1c.    Weight stable since our last visit    Previous  Food and nutrition related knowledge deficit evidenced by pt questions and report.  Pt benefits from repeated education regarding carbohydrate managed diet especially concept of carbohydrate vs sugar.   Diabetes management improved based on A1c and SMBG, continued lows especially in the evening - possibly d/t not eating midday.  change in medications 12/14 may help prevent hypos  - weight  stable through cancer treatment.       Interventions and Patient Goals:  Commended Nathan [name] on his efforts, support based visit  Suggest ok to have milkshake on occasion- keeping small will help prevent high blood sugars.  Pt aware and does not plan on having milkshakes very often  Not interested in [employer] vouchers    Previous  Reviewed hypoglycemia treatment: If glucose is less than 70 ok to drink juice, should only need ~4oz    Education Provided:  Please refer to Education section of chart     Education materials provided:   Previous  Insulin dosage plan   My Food Plan  Insulin instructions in AVS    Time Spent with Patient: 30  mins    Plan/Recommendations    Follow up: prn    Total Time Spent:  45 mins      [name], MS, RD, CD, CDCES  [facility] Dietitian  Department mobile: [phone]. [phone]  Email: [email or web address]

## 2022-10-20 NOTE — Progress Notes (Signed)
Nurse Care Management Brief Note:    Visit type: Appointed    Location: [facility]    The language the patient speaks: [place]   Interpreter present: None    Other(s) present:  NO    Summary: follow up with Nathan [name]    Diabetes: type 2  Hemoglobin A1C (%)   Date Value   06/09/2022 6.0   11/25/2021 6.1 (H)   05/20/2021 6.1 (H)      Latest Reference Range & Units 05/20/21 14:44 11/25/21 14:01   Creatinine/Unit, URN mg/dL 161 096   Albumin (Micro), URN mg/dL 0.45 4.09   Albumin/Creatinine Ratio, URN <30 mg/gcreat 7 7       Trulicity 0.75 mg weekly  Metformin 500 mg bid    Prostate cancer: lupron injection every 3 months.  Completed radiation tx.   Latest Reference Range & Units 07/08/21 14:22 11/25/21 14:01 01/09/22 10:50 08/21/22 15:11   Prostate Specific Antigen 0.00 - 4.00 ng/mL 28.19 (H) 23.06 (H) 28.33 (H) 0.18   (H): Data is abnormally high    H/o PE, on apixaben  2.5 mg bid  Denies SOB, able to walk to bus stop, around stores  Walks 2 flights of stairs at apartment    Blood pressure  BP Readings from Last 3 Encounters:   10/20/22 (!) 131/100   09/22/22 122/68   08/26/22 (!) 142/72     Enalapril 5 mg              Assessment: Plan   Diabetes: due for A1C with next lab.  100% TIR  GMI 5.9 % off insulin, on weekly GLP1    BP above target on enalapril 5 mg  Advised about NAS meal plan, relaxation, home BP monitoring  Agrees to bring home cuff in for correlation.    Arrange follow up 1 month.    Call sooner for questions/concerns.   Next Follow up:             11/21/2022 1:40 PM (Arrive by 1:25 PM) COMPREHENSIVE UNASSIGNED [facility]    12/01/2022 2:00 PM (Arrive by 1:45 PM) [phone] CARE MANAGEMENT [facility]    01/05/2023 2:00 PM (Arrive by 1:45 PM) [name], MD [facility]    01/05/2023 2:30 PM (Arrive by 2:15 PM) [phone] CARE MANAGEMENT [facility]

## 2022-11-01 ENCOUNTER — Encounter (HOSPITAL_COMMUNITY): Payer: Self-pay

## 2022-11-10 ENCOUNTER — Other Ambulatory Visit (HOSPITAL_BASED_OUTPATIENT_CLINIC_OR_DEPARTMENT_OTHER): Payer: Self-pay | Admitting: Internal Medicine

## 2022-11-10 DIAGNOSIS — B350 Tinea barbae and tinea capitis: Secondary | ICD-10-CM

## 2022-11-11 ENCOUNTER — Telehealth (HOSPITAL_BASED_OUTPATIENT_CLINIC_OR_DEPARTMENT_OTHER): Payer: Self-pay | Admitting: Internal Medicine

## 2022-11-11 ENCOUNTER — Encounter (HOSPITAL_BASED_OUTPATIENT_CLINIC_OR_DEPARTMENT_OTHER): Payer: Self-pay | Admitting: Internal Medicine

## 2022-11-11 ENCOUNTER — Other Ambulatory Visit (HOSPITAL_BASED_OUTPATIENT_CLINIC_OR_DEPARTMENT_OTHER): Payer: Self-pay

## 2022-11-11 DIAGNOSIS — R7303 Prediabetes: Secondary | ICD-10-CM

## 2022-11-11 DIAGNOSIS — C61 Malignant neoplasm of prostate: Secondary | ICD-10-CM

## 2022-11-11 DIAGNOSIS — E1165 Type 2 diabetes mellitus with hyperglycemia: Secondary | ICD-10-CM

## 2022-11-11 DIAGNOSIS — M1A3790 Chronic gout due to renal impairment, unspecified ankle and foot, without tophus (tophi): Secondary | ICD-10-CM

## 2022-11-11 DIAGNOSIS — I10 Essential (primary) hypertension: Secondary | ICD-10-CM

## 2022-11-11 DIAGNOSIS — I2699 Other pulmonary embolism without acute cor pulmonale: Secondary | ICD-10-CM

## 2022-11-11 DIAGNOSIS — E785 Hyperlipidemia, unspecified: Secondary | ICD-10-CM

## 2022-11-11 NOTE — Progress Notes (Signed)
Latest Reference Range & Units 05/04/17 22:23 07/23/18 22:18 07/26/19 01:00 07/10/21 23:55 10/08/22 01:46   Occult Bld 1 Result NRN  Negative Negative Negative Negative Negative

## 2022-11-11 NOTE — Telephone Encounter (Signed)
RETURN CALL: Voicemail - General Message    SUBJECT:  Refill Request     NAME OF MEDICATION(S): eloquist, allopurinol, atorvastatin, metformin, enalapril, Trulicity    DATE NEEDED BY: 4/29     PRESCRIBING PROVIDER: Dr. Lyn Henri    PHARMACY NAME/LOCATION: [facility], [address]     ADDITIONAL INFORMATION: Patient is nearly out of his medications. Patient's Pharmacy sated that we need to send out another prescription for this mediciations. In addition, Trulicity on back order at the [facility]. Patient needs alternatives/different pharmacy for Trulicty. Thanks

## 2022-11-12 ENCOUNTER — Other Ambulatory Visit (HOSPITAL_BASED_OUTPATIENT_CLINIC_OR_DEPARTMENT_OTHER): Payer: Self-pay | Admitting: Internal Medicine

## 2022-11-12 ENCOUNTER — Other Ambulatory Visit (HOSPITAL_BASED_OUTPATIENT_CLINIC_OR_DEPARTMENT_OTHER): Payer: Self-pay

## 2022-11-12 DIAGNOSIS — R7303 Prediabetes: Secondary | ICD-10-CM

## 2022-11-12 DIAGNOSIS — M1A3790 Chronic gout due to renal impairment, unspecified ankle and foot, without tophus (tophi): Secondary | ICD-10-CM

## 2022-11-12 DIAGNOSIS — E1165 Type 2 diabetes mellitus with hyperglycemia: Secondary | ICD-10-CM

## 2022-11-12 DIAGNOSIS — E785 Hyperlipidemia, unspecified: Secondary | ICD-10-CM

## 2022-11-12 DIAGNOSIS — I2699 Other pulmonary embolism without acute cor pulmonale: Secondary | ICD-10-CM

## 2022-11-12 DIAGNOSIS — I10 Essential (primary) hypertension: Secondary | ICD-10-CM

## 2022-11-12 MED ORDER — KETOCONAZOLE 2 % EX SHAM
MEDICATED_SHAMPOO | CUTANEOUS | 1 refills | Status: AC
Start: 2022-11-12 — End: ?

## 2022-11-12 MED ORDER — METFORMIN HCL 500 MG OR TABS
500.0000 mg | ORAL_TABLET | Freq: Two times a day (BID) | ORAL | 0 refills | Status: DC
Start: 2022-11-12 — End: 2023-03-20

## 2022-11-12 MED ORDER — ALLOPURINOL 100 MG OR TABS
200.0000 mg | ORAL_TABLET | Freq: Every day | ORAL | 0 refills | Status: DC
Start: 2022-11-12 — End: 2023-07-14

## 2022-11-12 MED ORDER — TRULICITY 0.75 MG/0.5ML SC SOAJ
0.7500 mg | SUBCUTANEOUS | 1 refills | Status: DC
Start: 2022-11-12 — End: 2022-12-29
  Filled 2022-11-12: qty 2, 28d supply, fill #0
  Filled 2022-12-03: qty 2, 28d supply, fill #1

## 2022-11-12 MED ORDER — ENALAPRIL MALEATE 5 MG OR TABS
2.5000 mg | ORAL_TABLET | Freq: Every day | ORAL | 0 refills | Status: DC
Start: 2022-11-12 — End: 2022-11-18

## 2022-11-12 MED ORDER — ATORVASTATIN CALCIUM 40 MG OR TABS
ORAL_TABLET | ORAL | 1 refills | Status: DC
Start: 2022-11-12 — End: 2023-07-14

## 2022-11-12 MED ORDER — APIXABAN 2.5 MG OR TABS
2.5000 mg | ORAL_TABLET | Freq: Two times a day (BID) | ORAL | 2 refills | Status: DC
Start: 2022-11-12 — End: 2023-07-14

## 2022-11-12 NOTE — Telephone Encounter (Signed)
Defer to Dr Renaldo Harrison-    "In addition, Trulicity on back order at the [facility]. Patient needs alternatives/different pharmacy for Trulicty. Thanks "

## 2022-11-12 NOTE — Telephone Encounter (Signed)
Medication Alternative Request    Drug Ordered:  Trulicity 0.75 MG/0.5ML Pen Injection     Directions:  Inject 0.5 mL (0.75 mg) under the skin one time a week. 4 once weekly doses provided     Reason for change:  Backordered (see other encounter)    Comments:  Mounjaro 5 MG and 7.5 MG also on backorder.     [name], Nathan     ZT    11/11/22  7:06 PM  Note      RETURN CALL: Voicemail - General Message     SUBJECT:  Refill Request      NAME OF MEDICATION(S): eloquist, allopurinol, atorvastatin, metformin, enalapril, Trulicity     DATE NEEDED BY: 4/29      PRESCRIBING PROVIDER: Dr. Lyn Henri     PHARMACY NAME/LOCATION: [facility], [address]      ADDITIONAL INFORMATION: Patient is nearly out of his medications. Patient's Pharmacy sated that we need to send out another prescription for this mediciations. In addition, Trulicity on back [facility]. Patient needs alternatives/different pharmacy for Trulicty. Thanks

## 2022-11-12 NOTE — Telephone Encounter (Signed)
Metformin was updated 09/22/22 90 days on file. See other encounter for Trulicity issue

## 2022-11-12 NOTE — Telephone Encounter (Signed)
Checked with [facility] to confirm. They note all strengths Trulicity OOS and they don't have an ETA. Same with Ozempic/Mounjaro. Victoza off and on. Checked with [facility] and able to order Trulicity 0.75 mg strength (others backorder).    Spoke with patient to see what he wants to do. He'd like to get Trulicity from [facility] pharmacy. Will redirect rx to there. He plans to pick up tomorrow or Friday.

## 2022-11-13 ENCOUNTER — Other Ambulatory Visit (HOSPITAL_BASED_OUTPATIENT_CLINIC_OR_DEPARTMENT_OTHER): Payer: Self-pay

## 2022-11-13 NOTE — Telephone Encounter (Signed)
Refills approved on 11/12/2022 in separate encounter.

## 2022-11-17 ENCOUNTER — Other Ambulatory Visit (HOSPITAL_BASED_OUTPATIENT_CLINIC_OR_DEPARTMENT_OTHER): Payer: Self-pay

## 2022-11-17 NOTE — Progress Notes (Signed)
Nurse Care Management Brief Note:    Visit type: return call    Location: home    The language the patient speaks: [place]   Interpreter present: None    Other(s) present:  NO    Summary: Return call to [place] re: copays for CGM sensors.    Diabetes:   Hemoglobin A1C (%)   Date Value   06/09/2022 6.0   11/25/2021 6.1 (H)   05/20/2021 6.1 (H)         Assessment: Plan   Advised to request Copay assist from [employer].    Assist with calling [employer] at our visit 5/13 is issue not resolved.        Next Follow up:             11/21/2022 1:40 PM (Arrive by 1:25 PM) COMPREHENSIVE UNASSIGNED [facility]    12/01/2022 2:00 PM (Arrive by 1:45 PM) [phone] CARE MANAGEMENT [facility]    01/05/2023 2:00 PM (Arrive by 1:45 PM) [name], MD [facility]    01/05/2023 2:30 PM (Arrive by 2:15 PM) [phone] CARE MANAGEMENT [facility]

## 2022-11-18 ENCOUNTER — Telehealth (HOSPITAL_BASED_OUTPATIENT_CLINIC_OR_DEPARTMENT_OTHER): Payer: Self-pay | Admitting: Internal Medicine

## 2022-11-18 DIAGNOSIS — J3089 Other allergic rhinitis: Secondary | ICD-10-CM

## 2022-11-18 DIAGNOSIS — I10 Essential (primary) hypertension: Secondary | ICD-10-CM

## 2022-11-18 MED ORDER — ENALAPRIL MALEATE 5 MG OR TABS
2.5000 mg | ORAL_TABLET | Freq: Every day | ORAL | 2 refills | Status: DC
Start: 2022-11-18 — End: 2023-07-14

## 2022-11-18 NOTE — Telephone Encounter (Signed)
Loratadine-  Discontinued by: Clare Gandy, RN on 01/15/2022 15:37   Reason: Med list cleanup      OK to restart?  Defer to provider for review and response

## 2022-11-18 NOTE — Telephone Encounter (Signed)
RETURN CALL: Voicemail - Detailed Message      SUBJECT:  Medication Questions     NAME OF MEDICATION(S): loratadine   enalapril 5 MG tablet    ADDITIONAL INFORMATION: patient was told by his pharmacy that he is needing a new prescription for the Loratadine due to it being expired and not showing up on his medication list.    For enalapril he is needing a refill for that.

## 2022-11-19 ENCOUNTER — Other Ambulatory Visit (HOSPITAL_BASED_OUTPATIENT_CLINIC_OR_DEPARTMENT_OTHER): Payer: Self-pay | Admitting: Internal Medicine

## 2022-11-19 DIAGNOSIS — J3089 Other allergic rhinitis: Secondary | ICD-10-CM

## 2022-11-19 MED ORDER — LORATADINE 10 MG OR TABS
10.0000 mg | ORAL_TABLET | Freq: Every day | ORAL | 1 refills | Status: DC
Start: 2022-11-19 — End: 2023-07-14

## 2022-11-19 NOTE — Progress Notes (Signed)
Chart reviewed, loratadine refilled.

## 2022-11-21 ENCOUNTER — Ambulatory Visit: Payer: Medicare HMO | Attending: Ophthalmology | Admitting: Unknown Physician Specialty

## 2022-11-21 DIAGNOSIS — E1165 Type 2 diabetes mellitus with hyperglycemia: Secondary | ICD-10-CM | POA: Insufficient documentation

## 2022-11-21 DIAGNOSIS — Z794 Long term (current) use of insulin: Secondary | ICD-10-CM | POA: Insufficient documentation

## 2022-11-21 NOTE — Progress Notes (Signed)
CC: Annual diabetes eye exam     HISTORY OF PRESENT ILLNESS:  Mr. Cuzzort is a 72 year old male who presents 11/21/2022 for annual diabetes exam.     Last seen on 02/19/2021 to establish care.     Patient reports that his vision has been stable. Finds that he sometimes has to focus when looking at distance vision. Has done well with OTC readers. Notes intermittent allergic rhinitis. Has intermittent floaters in both eyes, stable overall. Denies flashes and curtains. Denies issues with glare and halos around lights.       Lab Results   Component Value Date    A1C 6.0 06/09/2022        Past Ocular History:  None    OMeds:  None    MEDICATIONS:  Reviewed names without dosages with patient and updated in epic tabs 02/19/2021     OCT macula 02/19/21  OD: hyaloid face down, normal fc, normal retinal laminations, no IRF/SRF, ?fibrovascular PED vs druse nasally adjacent to nerve.   OS: hyaloid face down, normal fc, normal retinal laminations, no IRF/SRF.    IMPRESSION/PLAN:    # DM2 without retinopathy  - no evidence of diabetic retinopathy on exam today   - last A1c 6.0 on 06/09/2022   - discussed importance of good blood glucose control  - recommend DFE every 1-2 years     # Mixed type cataracts, both eyes  - not visually significant   - continue to monitor     # Presbyopia  - Doing well with OTC readers   - continue to monitor     RTC comprehensive 1-2 years for [facility]     Seen with Dr. Berneda Rose, MD  Ophthalmology, PGY-1   [place]

## 2022-11-21 NOTE — Patient Instructions (Signed)
Continue excellent management of your diabetes!     Return to clinic in 1 year for an annual diabetic eye exam.

## 2022-11-27 ENCOUNTER — Other Ambulatory Visit (HOSPITAL_BASED_OUTPATIENT_CLINIC_OR_DEPARTMENT_OTHER): Payer: Self-pay

## 2022-12-01 ENCOUNTER — Ambulatory Visit: Payer: Medicare HMO | Attending: Internal Medicine

## 2022-12-01 ENCOUNTER — Other Ambulatory Visit (HOSPITAL_BASED_OUTPATIENT_CLINIC_OR_DEPARTMENT_OTHER): Payer: Self-pay

## 2022-12-01 VITALS — BP 136/90 | HR 62 | Resp 18 | Wt 232.7 lb

## 2022-12-01 DIAGNOSIS — C61 Malignant neoplasm of prostate: Secondary | ICD-10-CM

## 2022-12-01 DIAGNOSIS — Z794 Long term (current) use of insulin: Secondary | ICD-10-CM

## 2022-12-01 DIAGNOSIS — E1165 Type 2 diabetes mellitus with hyperglycemia: Secondary | ICD-10-CM | POA: Insufficient documentation

## 2022-12-01 LAB — CBC (HEMOGRAM)
Hematocrit: 38 % (ref 38.0–50.0)
Hemoglobin: 12.7 g/dL — ABNORMAL LOW (ref 13.0–18.0)
MCH: 30.2 pg (ref 27.3–33.6)
MCHC: 33.7 g/dL (ref 32.2–36.5)
MCV: 90 fL (ref 81–98)
Platelet Count: 219 10*3/uL (ref 150–400)
RBC: 4.21 10*6/uL — ABNORMAL LOW (ref 4.40–5.60)
RDW-CV: 12.9 % (ref 11.0–14.5)
WBC: 4.16 10*3/uL — ABNORMAL LOW (ref 4.3–10.0)

## 2022-12-01 LAB — COMPREHENSIVE METABOLIC PANEL
ALT (GPT): 14 U/L (ref 10–48)
AST (GOT): 15 U/L (ref 9–38)
Albumin: 4.2 g/dL (ref 3.5–5.2)
Alkaline Phosphatase (Total): 72 U/L (ref 36–161)
Anion Gap: 13 — ABNORMAL HIGH (ref 4–12)
Bilirubin (Total): 0.6 mg/dL (ref 0.2–1.3)
Calcium: 9.7 mg/dL (ref 8.9–10.2)
Carbon Dioxide, Total: 23 meq/L (ref 22–32)
Chloride: 106 meq/L (ref 98–108)
Creatinine: 1.41 mg/dL — ABNORMAL HIGH (ref 0.51–1.18)
Glucose: 98 mg/dL (ref 62–125)
Potassium: 4 meq/L (ref 3.6–5.2)
Protein (Total): 7.1 g/dL (ref 6.0–8.2)
Sodium: 142 meq/L (ref 135–145)
Urea Nitrogen: 21 mg/dL (ref 8–21)
eGFR by CKD-EPI 2021: 54 mL/min/{1.73_m2} — ABNORMAL LOW (ref >59)

## 2022-12-01 LAB — ALBUMIN/CREATININE RATIO, RANDOM URINE
Albumin (Micro), URN: 0.71 mg/dL
Albumin/Creatinine Ratio, URN: 4 mg/g{creat} (ref <30)
Creatinine/Unit, URN: 159 mg/dL

## 2022-12-01 LAB — PSA, DIAGNOSTIC/MONITORING: PSA, Diagnostic/Monitoring: <0.03 ng/mL (ref 0.00–4.00)

## 2022-12-01 NOTE — Progress Notes (Signed)
Nurse Care Management Brief Note:    Visit type: Appointed    Location: [facility]    The language the patient speaks: [place]   Interpreter present: None    Other(s) present:  NO    Summary: Follow up with Nathan [name]  Diabetes  Hemoglobin A1C (%)   Date Value   06/09/2022 6.0   11/25/2021 6.1 (Nathan)   05/20/2021 6.1 (Nathan)     Trulicity 0.75 mg weekly  Metformin 500 mg 1bid       Latest Reference Range & Units 05/20/21 14:44 11/25/21 14:01   Albumin (Micro), URN mg/dL 1.61 0.96   Albumin/Creatinine Ratio, URN <30 mg/gcreat 7 7       Blood pressure  BP Readings from Last 3 Encounters:   12/01/22 136/90   10/20/22 (!) 131/100   09/22/22 122/68     Home BP 136/90  [name] cuff 133/86    Home 152/93  126/74  104/70  152/99  139/86  142/87    Enalapril 2.5 mg started in December    C/o dry cough for > 3 months.  Occurs night and day      Prostate cancer: receives quarterly lupron injections fred hutch   Latest Reference Range & Units 07/08/21 14:22 11/25/21 14:01 01/09/22 10:50 08/21/22 15:11   Prostate Specific Antigen 0.00 - 4.00 ng/mL 28.19 (Nathan) 23.06 (Nathan) 28.33 (Nathan) 0.18       Assessment: Plan   Diabetes without retinopathy  Due for A1C, urine m/a/c/r  Agrees to stop at lab today.    Assist with call to [employer] to explore reason for CGM copays.  Left voice message requesting return call.    Variable BP, good home cuff correlation with [name] cuff.  Chronic cough onset shortly after starting ACE I. Message to Dr. Renaldo Harrison to review and consider change to ARB.    Coordination of care: reviewed future visits.     Next Follow up:             12/03/2022 5:30 PM Nathan [name]    12/03/2022 6:00 PM Nathan [name]    12/19/2022 3:00 PM Nathan [name]    01/05/2023 2:00 PM (Arrive by 1:45 PM) Nathan [name]    01/05/2023 2:30 PM (Arrive by 2:15 PM) [phone] CARE MANAGEMENT  [name]

## 2022-12-02 ENCOUNTER — Other Ambulatory Visit (HOSPITAL_BASED_OUTPATIENT_CLINIC_OR_DEPARTMENT_OTHER): Payer: Self-pay

## 2022-12-02 LAB — HEMOGLOBIN A1C, HPLC: Hemoglobin A1C: 5.4 % (ref 4.0–6.0)

## 2022-12-03 ENCOUNTER — Ambulatory Visit (HOSPITAL_BASED_OUTPATIENT_CLINIC_OR_DEPARTMENT_OTHER): Payer: Medicare HMO

## 2022-12-03 ENCOUNTER — Other Ambulatory Visit (HOSPITAL_BASED_OUTPATIENT_CLINIC_OR_DEPARTMENT_OTHER): Payer: Self-pay

## 2022-12-05 ENCOUNTER — Other Ambulatory Visit (HOSPITAL_BASED_OUTPATIENT_CLINIC_OR_DEPARTMENT_OTHER): Payer: Self-pay

## 2022-12-18 ENCOUNTER — Other Ambulatory Visit (HOSPITAL_BASED_OUTPATIENT_CLINIC_OR_DEPARTMENT_OTHER): Payer: Self-pay

## 2022-12-19 ENCOUNTER — Ambulatory Visit: Admit: 2022-12-19 | Discharge: 2022-12-19 | Disposition: A | Discharge status: Home / Self Care | Payer: Medicare HMO

## 2022-12-19 ENCOUNTER — Ambulatory Visit (HOSPITAL_BASED_OUTPATIENT_CLINIC_OR_DEPARTMENT_OTHER): Payer: Medicare HMO

## 2022-12-19 VITALS — BP 119/81 | HR 62 | Temp 98.6°F | Resp 16

## 2022-12-19 DIAGNOSIS — C61 Malignant neoplasm of prostate: Secondary | ICD-10-CM

## 2022-12-19 DIAGNOSIS — Z79818 Long term (current) use of other agents affecting estrogen receptors and estrogen levels: Secondary | ICD-10-CM | POA: Insufficient documentation

## 2022-12-19 DIAGNOSIS — Z5111 Encounter for antineoplastic chemotherapy: Secondary | ICD-10-CM | POA: Insufficient documentation

## 2022-12-19 MED ORDER — LEUPROLIDE ACETATE (3 MONTH) 22.5 MG IM KIT
22.5000 mg | PACK | Freq: Once | INTRAMUSCULAR | Status: AC
Start: 2022-12-19 — End: 2022-12-19
  Administered 2022-12-19: 22.5 mg via INTRAMUSCULAR
  Filled 2022-12-19: qty 1

## 2022-12-19 NOTE — Progress Notes (Signed)
Oncology Infusion Nurse Note    Subjective / Visit Information:    Reason for Visit: OP Infusion (Lupron)      Objective:   Vitals:    12/19/22 1553   Temp: 37 C   Pulse: 62   BP: 119/81   Resp: 16   SpO2: 98%       Medication Administrations This Visit         leuprolide (3 month) (Lupron Depot) injection 22.5 mg Admin Date  12/19/2022  15:58 Action  Given Dose  22.5 mg Route  Intramuscular Site  Right Ventrogluteal Documented By  Chauncy Passy    Ordering Provider: [name], MD    NDC: [phone]            Assessment and Plan:            Hand off: No    Discharge:   Patient tolerated treatment well and without complications.  Discharge disposition:   Accompanied by: Self  Discharged to: Home  Response: Aware of existing appointments, Aware of clinic contact information, States understanding of plan, and No further questions    Summary: Patient denies having any new concerns. He tolerated injection well and discharged ambulatory in NAD      Additional documentation may exist in Flowsheets or Education Activity

## 2022-12-19 NOTE — Progress Notes (Signed)
Radiation Oncology Follow Up Visit     Diagnosis:  prostate cancer  Stage: high-risk  Prior RT:  SBRT to the prostate, SV and pelvic lymph nodes completed on 06/30/22.   Providers: Patient Care Team:  [name], MD as PCP - General (Internal Medicine)  Cheri Kearns, MD as Managed Care (Internal Medicine)  Luisa Dago, MD as Supervising Physician (Internal Medicine)  Alycia Rossetti, Early Osmond, MD as Surgical Oncologist (Urology)  [name], MD as Radiation Oncologist (Radiation Oncology)  Nicholaus Corolla, RN as Registered Nurse (Nursing)  PCP: Standley Brooking, MD     CC:     Nathan [name] is a 72 year old male with PMHx of Type 2 DM, HLD and newly diagnosed high risk prostate cancer (iT3a/T3b, Gleason score 4+3=7, 11/12 cores positive, iPSA 28.33, prostate volume 36.6cc).  PSMA PET/CT showed localized disease with possible invasion into the Left SV base.  He is s/p SBRT to the prostate, SV and pelvic lymph nodes completed on 06/30/22 with concurrent ADT.       Interval History:     He is doing well. His urinary symptoms are largely back to baseline. The only difference he notices is that the flow is slightly slower. He has regular BM and denies diarrhea and constipation.     He is tolerating lupron well has had 1-2 hot flashes/2 weeks but "it is getting better".     Urinary function:       04/23/2022     4:05 PM   AUA BPH    How often have you had a sensation of not emptying your bladder completely after you finished urinating? 1   How often have you had to urinate again less than two hours after you finished urinating? 1   How often have you stopped and started again several times when you urinated? 0   How often have you found it difficult to postpone urination? 0   How often have you had a weak urinary stream? 0   How often have you had to push or strain to begin urination? 0   How many times did you most typically get up to urinate from the time you went to bed at night until he time you got up in the  morning? 2   If you were to spend the rest of your life with your urinary condition the way it is now, how would you feel about that? 1   Total Symptom Score:   1-7 Mild   8-19 Moderate   20-35 Severe 4      IPSS: 7  QOL:1    Sexual function:      04/23/2022     4:05 PM   IIEF-5   When you had erections with sexual stimulation, HOW OFTEN were your erections hard enough for penetration? 1   During sexual intercourse, HOW OFTEN were you able to maintain your erection after you had penetrated (entered) your partner? 1   During sexual intercourse, HOW DIFFICULT was it to maintain your erection to completion of intercourse? 1   When you attempted sexual intercourse, HOW OFTEN was it satisfactory for you? 1      Prior RT:  See above     ROS: Negative except those detailed above.       PE:    Temp: 35.2 C  Pulse: 69  BP: 132/84  Resp: 20  SpO2: 99 %  Weight: 103.9 kg (229 lb)  General  Well-appearing well-nourished, appears stated age, in no acute distress.    HEENT  Normocephalic, atraumatic,anicteric. No eye/nose/ear discharge.     Neck  No gross lymphadenopathy. No goiter.    Lung  Normal respiratory effort, breathing comfortably on RA.    Abdomen   No apparent abdominal distention.    Extremities  No visible edema   Neuro  Moving all extremities spontaneously.     Psych  Attentive, conversant. Normal speech and affect.           Performance status: ECOG 0       Data Review:     Per HPI.     PSA, Diagnostic/Monitoring (ng/mL)   Date Value   12/01/2022 <0.03   08/21/2022 0.18   01/09/2022 28.33 (H)     PSA, Screening (ng/mL)   Date Value   11/25/2021 23.06 (H)   07/08/2021 28.19 (H)     Testosterone (ng/dL)   Date Value   54/03/8118 11 (L)          Assessment/Plan:    Nathan [name] is a 72 year old male with PMHx of Type 2 DM, HLD and newly diagnosed high risk prostate cancer (iT3a/T3b, Gleason score 4+3=7, 11/12 cores positive, iPSA 28.33, prostate volume 36.6cc).  PSMA PET/CT showed localized disease with  possible invasion into the Left SV base.  He is s/p SBRT to the prostate, SV and pelvic lymph nodes completed on 06/30/22 with concurrent ADT.      He is doing well with hs urinary and bowel function almost back to baseline. He is also tolerating lupron well.     Plan:  - RTC in 3 months with prior PSA and testosterone.   - He is past due for his third shot of lupron. We had to schedule and reschedule multiple times. He is going to get his third Lupron today after seeing me in the clinic.   - Will refer him to medical oncology to manage long term ADT for his high-risk prostate cancer and he is onboard with that.     Barnetta Hammersmith, MD, PhD  Radiation Oncology      40 minutes were spent on the patient's care, which included  5 minutes in pre-clinic review of clinical records and independent review of imaging studies;  25 minutes counseling with the patient face-to-face and review of logistics/potential side effects of radiotherapy;  and 5 minutes in post-clinic documentation of clinical information in the electronic health record and care coordination.

## 2022-12-23 ENCOUNTER — Telehealth (HOSPITAL_BASED_OUTPATIENT_CLINIC_OR_DEPARTMENT_OTHER): Payer: Self-pay

## 2022-12-23 ENCOUNTER — Encounter (HOSPITAL_BASED_OUTPATIENT_CLINIC_OR_DEPARTMENT_OTHER): Payer: Self-pay

## 2022-12-23 ENCOUNTER — Other Ambulatory Visit (HOSPITAL_BASED_OUTPATIENT_CLINIC_OR_DEPARTMENT_OTHER): Payer: Self-pay

## 2022-12-23 NOTE — Telephone Encounter (Signed)
Nathan Hicklin, RN of [facility] Nurse Navigation contacted patient by phone to provide education and care coordination for patient's diagnosis of prostate cancer with rising PSA on ADT. Patient is referred by Dr. Brian [name] to [facility] for evaluation and Treatment.     Patient's intention for visit: Receive Care at [facility]     Patient's understanding of current situation:   Patient's granddaughter, Nathan [name] at [phone] coordinates the patient's care. NN spoke to Nathan [name] and the patient agreed to get schedule for consultation with ***.   Assessment:  Date of diagnosis: 03/31/13       Metastatic: {Y/N:101112}   Work Up: Labs: YES: 11/18/22  Prior Imaging: {Prior Imaging:128805}  History of cancers: {Priorcancer:128800}          Oncologic History: {Vanishing Tip  Click here to document the patient's Oncology History:9999}   Oncology History Overview     Report reviewed per Dr [name]'s 11/24/22 Clinic note plus [facility]    11/2012, acute urinary retention. PSA unavailable. 01/2013, prostate biopsy had Gleason 7 in 6/6 cores.     PSA levels and Lupron injections include:  11/18/22: 6.03  06/09/22... 4.86 ng/mL  10/14/21... Lupron 45 mg  10/11/21... 4.06  08/05/21... 4.28  04/08/21... Lupron 45mg 6 month depo  04/02/21... PSA 4.28  10/09/20... Lupron 45 mg  09/27/20... 7.11 ng/ml  02/09/20... 9.01 ng/ml  02/09/20... Lupron 22.5 mg  07/04/19... 2.59 ng/ml  03/29/19... 2.83 ng/ml  12/22/18... 30 mg Lupron injection  11/23/18... 6.54 ng/ml with T level 0.3 ng/ml  08/19/17... 2.08 ng/ml  02/17/18... 30 mg Lupron injection  01/26/18... 3.44 ng/ml  10/28/17... 1.71 ng/ml  06/10/17... 30 mg Lupron injection  06/04/17... 2.14 ng/ml  12/04/16... 30 mg Lupron injection  11/20/16... 8.71 ng/ml  08/20/16... 1.6 ng/ml  05/27/16... 1.55 ng/ml  03/11/16... 30 mg Lupron injection  02/26/16... 8.77 ng/ml    02/11/13: Bone Scan:  1. Isolated metastasis in the right ischial bone, corresponding to blastic focus on CT.     02/11/13: Bone Scan:  1. 17 mm indeterminate (21  Hounsfield units) left adrenal nodule. This is potentially a metastasis. MRI may improve diagnostic specificity and is recommended.   2. No other evidence of visceral, lung, local soft tissue, or nodal metastasis. 2 cm blastic focus right ischium worrisome for a metastasis.   3. Bilateral extrarenal pelves without signs of obstruction.   4. Diffusely thick walled bladder, without focal mass.   5. Normal appearance of prostate and seminal vesicles.     03/31/13: TURPT  Prostate, transurethral resection (A1-1 - A3-1):  - Prostatic adenocarcinoma.              - Gleason score: 4+5=9, WHO grade group 5               - Percentage of Pattern 4: 50%, Pattern 5: 45%               - Involves approximately 80% of the sampled parenchyma               - Cribriform pattern: absent.    10/11/13: Bone Scan  1. Interval decreased uptake corresponding to the right ischial tuberosity.   2. Moderate degenerative changes seen corresponding to bilateral L5-S1 facets.   3. Multiple level thoracic costovertebral joint degenerative changes.   4. Right knee metabolically active osteoarthrosis.   5. Bilateral sternoclavicular, acromioclavicular joint osteoarthritis.     TREATMENT:  2014-present, on IADT        Relevant Medical History: {Vanishing   Tip  Click here to document the patient's Medical History:9999}   Past Medical History:   Diagnosis Date    Arthritis     Elevated PSA     Prostate CA (HCC)     Urinary retention     UTI (urinary tract infection)       Relevant Surgical History: {Vanishing Tip  Click here to document the patient's Surgical History:9999}   Past Surgical History:   Procedure Laterality Date    CATARACT EXTRACTION  08/2017    left and right eye        Family History:  {Vanishing Tip  Click here to document the patient's Family History:9999}   No family history on file.     Social History: {Vanishing Tip  Click here to document the patient's Social History:9999}  Social History     Social History Narrative    Fluid  Intake- Cups Per Day: Drinks over 16 ounces of water per day.       Clinical Trial Pre-Screening Questionnaire:    What is your preferred form of communication should a research nurse have information to share with you in advance? {communication:127677}    Prior therapies: {records yes no:127686}  Has the patient had genetic testing? {Yes***/No:114545}  Does the patient have neuroendocrine or small cell cancer features (prostate CA only)? {YES/NO:105354}  ACTIVE secondary malignancy, which they are currently receiving treatment for? {YES/NO:105354}  Diagnosis of a second primary cancer within the last 5 years?  {YES/NO:105354}  History of autoimmune disease on steroids or active therapy?  {YES/NO:105354}                 Myocardial event within the last 6 months?  {YES/NO:105354}                                            Stroke within the last 6 months?  {YES/NO:105354}                   History of solid organ or bone marrow transplant?  {YES/NO:105354}  Has [employer] insurance?  {YES/NO:105354}       Patient is diagnosed with Metastatic prostate cancer. He is being referred by Dr. Dr. Brian [name] to [facility] for Evaluation and Treatment. NN spoke to the patient's granddaughter, Nathan [name] at [phone] who coordinates the patient's ccare. Please call patient's granddaughter, Nathan [name] at [phone] and schedule the patient with [facility]. It appears that the patient's images were requested before, but they do not seem to have been loaded to PACS. [facility] can you please double check before requesting the images. Thank you    Date of Diagnosis 04/01/23   Insurance Coverage MEDICARE PART A AND B. Secondary: [place] MEDICAID    Urgent? Yes   Pt reported information? {YES/NO:105354}   To be scheduled with: [facility]   Other Referrals Placed: None     To be Requested   Path   Date Description Facility   03/2013 Prostate Biopsy [facility]        Films   Date Description Facility   02/11/13 Bone Scan Films  [facility]    02/11/13  CT A/P Films [facility]    10/11/13 Bone Scan Films [facility]         Records/Labs   Date Description Facility   2022 to present Clinic notes, pathology report, radiology report, labs EPIC

## 2022-12-23 NOTE — Telephone Encounter (Signed)
Nurse Navigator contacted the patient for initial intake but the patient did not answer the call. The NN Left a voice message for the patient to call back at [phone]. NN will follow up with the patient on 12/24/22 if the patient does not call back.

## 2022-12-25 ENCOUNTER — Encounter (HOSPITAL_BASED_OUTPATIENT_CLINIC_OR_DEPARTMENT_OTHER): Payer: Self-pay

## 2022-12-25 ENCOUNTER — Telehealth (HOSPITAL_BASED_OUTPATIENT_CLINIC_OR_DEPARTMENT_OTHER): Payer: Self-pay

## 2022-12-25 NOTE — Telephone Encounter (Signed)
Nathan Jaksch, RN of [facility] Nurse Navigation contacted patient by phone to provide education and care coordination for patient's diagnosis of high risk prostate cancer (iT3a/T3b, Gleason score 4+3=7) s/p SBRT to the prostate, SV and pelvic lymph nodes completed on 06/30/22 with concurrent ADT. He has received 3 lupron injections. Patient was referred by Dr. [name] to Dr. Welton Flakes for management of Long term ADT.     Patient's intention for visit: Receive [facility]     Patient's understanding of current situation:   Patient informed the NN that he received his #3 Lupron injection on 12/19/22 and the he is supposed to be receiving Lupron for 1 & 1/2 years. He agreed to get schedule for consultation with Dr. Welton Flakes.    Assessment:  Date of diagnosis: 01/24/22       Metastatic: No   Localized: Yes  Work Up: Labs: YES: 12/01/22 PSA <0.03  Prior Imaging: Yes PET Facility All images are in PACS  History of cancers: No    Oncologic History:    Oncology History Overview   PSA Monitoring:  07/08/21: PSA 28.19  11/25/21: PSA 23.06  01/09/22: PSA 28.33  08/21/22: PSA 0.18  12/01/22: PSA <0.03    01/24/22: Prostate biopsy:  Highest Gleason score: 4+3=7, WHO grade group 3               Composite Gleason score: 3+4=7, WHO grade group 2               - Percentage of Pattern 4: 30%               - Cribriform pattern: present, small.    02/14/22: CT A/P  Sclerotic lesions in the pelvis are concerning for prostate cancer metastases given the clinical history.  No convincing evidence for intraperitoneal disease    03/26/22: PSMA PET CT  Markedly tracer avid prostate cancer involving peripheral zone from apex to bilateral mid and left base, possibly extending to the base of the left seminal vesicle. No PSMA PET/CT evidence of nodal, osseous or visceral metastases.     05/26/22: MRI Prostate   - PI-RADS v2.1 score 5: clinically significant cancer is highly likely to be present.  - No evidence of macroscopic extracapsular extension. Left seminal  vesicle invasion is likely present.  - Findings consistent with T3B disease, provided targeted biopsies are positive.   - No lymphadenopathy.  No suspicious bone lesions.  - No prior prostate MRI scans available for comparison.    06/18/22 -06/30/22: SBRT to the prostate, SV, and pelvic lymph nodes + ADT  ([facility])    12/19/22: #3 Lupron Injection q3 months          Relevant Medical History:    Past Medical History:   Diagnosis Date    History of radiation therapy 06/18/2022    SBRT 06/18/22 -06/30/22    Prostate cancer (HCC) 2023    Obesity, unspecified 11/24/2008    Unspecified asthma, with status asthmaticus 11/24/2008    Generalized osteoarthrosis, unspecified site 11/24/2008    Other and unspecified hyperlipidemia 11/24/2008    Urinary complications 11/24/2008    Diabetes mellitus (HCC)     History of hyperlipidemia     Kidney disease     Myocardial infarction [facility])     Pulmonary embolism (HCC)       Relevant Surgical History:    Past Surgical History:   Procedure Laterality Date    ANKLE SURGERY Right     achilles x2  gout tophus excision  2016    PROSTATE BIOPSY  2023        Family History:     Family History   Problem Relation Age of Onset    No Known Problems Mother     No Known Problems Father     Blindness No Hx Of     Cataracts No Hx Of     Glaucoma No Hx Of     Macular Degeneration No Hx Of     Retinal Detachment No Hx Of         Social History:   Social History     Social History Narrative    Originally from [place]. Lives by himself in apartment with section 8. Has brother in [place] and one in [place]. 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 Likes to go fishing, reads the bible.         Tobacco: smoked age [age] approx 1 pack a week. Stopped now.     Alcohol: Denies. Remote drinking in last teens     Substances: Denies        Patient is diagnosed with Gleason score 4+3=7 s/p Radiation Therapy with concurrent  ADT. He has received 3 lupron injections. Patient was referred by Dr. [name] to Dr. Welton Flakes for management of Long term ADT. NN spoke to the patient and he is ready for scheduling. Please collect the identified records and schedule the patient with Dr. Welton Flakes.    Date of Diagnosis 01/24/22   Insurance Coverage [employer] DUAL COMPLETE HMO D-SNP MEDICARE    Urgent? No   Pt reported information? Yes Plus Chart Review   To be scheduled with: [facility] Dr. Welton Flakes   Other Referrals Placed: None     To be Requested   Path   Date Description Facility   01/24/22 Prostate Biopsy  IN EPIC        Films   Date Description Facility   2023 to present CT Scan, PSMA PET CT, MRI Prostate In PACS        Records/Labs   Date Description Facility   2023 to Present [facility] records, imaging report, and Labs In [facility]

## 2022-12-26 NOTE — Progress Notes (Signed)
Attending Note:  I did not see the patient but have reviewed the note and agree with the assessment and plan.    Kassey Laforest T. Chayson Charters, MD, PhD  Attending Physician   Division of Comprehensive Ophthalmology   [facility]

## 2022-12-29 ENCOUNTER — Other Ambulatory Visit (HOSPITAL_BASED_OUTPATIENT_CLINIC_OR_DEPARTMENT_OTHER): Payer: Self-pay | Admitting: Internal Medicine

## 2022-12-29 DIAGNOSIS — E1165 Type 2 diabetes mellitus with hyperglycemia: Secondary | ICD-10-CM

## 2022-12-31 ENCOUNTER — Other Ambulatory Visit (HOSPITAL_BASED_OUTPATIENT_CLINIC_OR_DEPARTMENT_OTHER): Payer: Self-pay

## 2022-12-31 MED ORDER — TRULICITY 0.75 MG/0.5ML SC SOAJ
0.7500 mg | SUBCUTANEOUS | 0 refills | Status: DC
Start: 2022-12-31 — End: 2023-01-26
  Filled 2022-12-31: qty 2, 28d supply, fill #0

## 2023-01-03 ENCOUNTER — Telehealth (HOSPITAL_BASED_OUTPATIENT_CLINIC_OR_DEPARTMENT_OTHER): Payer: Self-pay

## 2023-01-03 NOTE — Telephone Encounter (Signed)
RETURN CALL: Voicemail - Detailed Message      SUBJECT:  Cancellation/Reschedule Request     APPOINTMENT DATE TO CANCEL: 01/05/20    REASON FOR CANCELLATION: n/a    RESCHEDULE PREFERRED DATE/TIME:   03/09/23    ADDITIONAL INFORMATION: Patient called wanting to reschedule his appt, next available with Dr.Goss was 03/09/23 which we scheduled however I was unable to reschedule the clinical support appt because their schedule does not go out that far. If there are any issues please give the patient a call. Thank you!

## 2023-01-03 NOTE — Progress Notes (Deleted)
[facility] - Outpatient Return Clinic Note    Date:  01/05/2023    INTERVAL HISTORY/HPI:    ***      PROBLEM LIST: See Epic Problem List    MEDICATIONS:    Current Outpatient Medications:     acetaminophen 500 MG tablet, Take 2 tablets (1,000 mg) by mouth every 6 hours as needed for pain. (Patient not taking: Reported on 04/07/2022), Disp: 20 tablet, Rfl: 0    Albuterol Sulfate HFA 108 (90 Base) MCG/ACT Inhalation Aero Soln, Inhale 2 puffs by mouth every 6 hours as needed for shortness of breath/wheezing., Disp: 1 Inhaler, Rfl: 6    allopurinol 100 MG tablet, Take 2 tablets (200 mg) by mouth daily., Disp: 180 tablet, Rfl: 0    apixaban (Eliquis) 2.5 MG tablet, Take 1 tablet (2.5 mg) by mouth 2 times a day., Disp: 60 tablet, Rfl: 2    atorvastatin 40 MG tablet, Take 1 tablet (40 mg) by mouth every evening. To lower cholesterol., Disp: 90 tablet, Rfl: 1    blood glucose test strip, Use 1 strip 4 times a day. Use to check blood sugar., Disp: 400 strip, Rfl: 4    ciprofloxacin 500 MG tablet, Take 1 tablet (500 mg) by mouth 2 times a day. Continue taking until it is gone. (Patient not taking: Reported on 12/19/2022), Disp: 3 tablet, Rfl: 0    dulaglutide (Trulicity) 0.75 MG/0.5ML pen-injector, Inject 0.5 mL (0.75 mg) under the skin one time a week. 4 once weekly doses provided, Disp: 2 mL, Rfl: 0    enalapril 5 MG tablet, Take 0.5 tablets (2.5 mg) by mouth daily., Disp: 45 tablet, Rfl: 2    Fluticasone Propionate HFA 110 MCG/ACT Inhalation Aerosol, Inhale 1 puff by mouth every 12 hours., Disp: 1 Inhaler, Rfl: 6    glucometer (OneTouch Verio Flex System) w/Device kit, Use to check blood sugar 4 times a day., Disp: 1 kit, Rfl: 6    glucose 4 g chewable tablet, Chew and swallow 4 tablets (16 g) by mouth every 15 minutes as needed for low blood sugar. Recheck blood sugar as needed., Disp: 50 tablet, Rfl: 3    ibuprofen 400 MG tablet, Take 1 tablet (400 mg) by mouth every 6 hours as needed for pain., Disp: 20  tablet, Rfl: 0    ketoconazole 2 % shampoo, apply topically face and scalp if needed, Disp: 120 mL, Rfl: 1    loratadine 10 MG tablet, Take 1 tablet (10 mg) by mouth daily., Disp: 30 tablet, Rfl: 1    metFORMIN 500 MG tablet, Take 1 tablet (500 mg) by mouth 2 times a day with meals., Disp: 180 tablet, Rfl: 0    Triamcinolone Acetonide 0.1 % External Ointment, Apply to affected area on neck 2 times a day., Disp: 15 g, Rfl: 0       ALLERGIES:  Review of patient's allergies indicates:  No Known Allergies    PHYSICAL EXAM:    ***    LAB AND IMAGING RESULTS:  Orders Only on 12/01/22   1. Prostate Specific Antigen   Result Value Ref Range    PSA, Diagnostic/Monitoring <0.03 0.00 - 4.00 ng/mL   2. Comprehensive Metabolic Panel   Result Value Ref Range    Sodium 142 135 - 145 meq/L    Potassium 4.0 3.6 - 5.2 meq/L    Chloride 106 98 - 108 meq/L    Carbon Dioxide, Total 23 22 - 32 meq/L    Anion Gap 13 (  H) 4 - 12    Glucose 98 62 - 125 mg/dL    Urea Nitrogen 21 8 - 21 mg/dL    Creatinine 1.61 (H) 0.51 - 1.18 mg/dL    Protein (Total) 7.1 6.0 - 8.2 g/dL    Albumin 4.2 3.5 - 5.2 g/dL    Bilirubin (Total) 0.6 0.2 - 1.3 mg/dL    Calcium 9.7 8.9 - 09.6 mg/dL    AST (GOT) 15 9 - 38 U/L    Alkaline Phosphatase (Total) 72 36 - 161 U/L    ALT (GPT) 14 10 - 48 U/L    eGFR by CKD-EPI 2021 54 (L) >59 mL/min/1.73_m2   3. CBC   Result Value Ref Range    WBC 4.16 (L) 4.3 - 10.0 10*3/uL    RBC 4.21 (L) 4.40 - 5.60 10*6/uL    Hemoglobin 12.7 (L) 13.0 - 18.0 g/dL    Hematocrit 38 04.5 - 50.0 %    MCV 90 81 - 98 fL    MCH 30.2 27.3 - 33.6 pg    MCHC 33.7 32.2 - 36.5 g/dL    Platelet Count 409 811 - 400 10*3/uL    RDW-CV 12.9 11.0 - 14.5 %

## 2023-01-05 ENCOUNTER — Other Ambulatory Visit (HOSPITAL_BASED_OUTPATIENT_CLINIC_OR_DEPARTMENT_OTHER): Payer: Self-pay

## 2023-01-05 ENCOUNTER — Ambulatory Visit (HOSPITAL_BASED_OUTPATIENT_CLINIC_OR_DEPARTMENT_OTHER): Payer: Medicare HMO | Admitting: Internal Medicine

## 2023-01-05 ENCOUNTER — Ambulatory Visit (HOSPITAL_BASED_OUTPATIENT_CLINIC_OR_DEPARTMENT_OTHER): Payer: Medicare HMO

## 2023-01-05 NOTE — Progress Notes (Signed)
Follow up call to [facility] re: missed visit today.  Said called to Cancel/Reschedule with Dr. Renaldo Harrison.    Diabetes: uncertain if able to stay on CGM sensor due to copays.  Hemoglobin A1C (%)   Date Value   12/01/2022 5.4   06/09/2022 6.0   11/25/2021 6.1 (H)     Off insulin, on trulicity, 0.75 mg and metformin 1 gm   Latest Reference Range & Units 08/21/22 15:11 12/01/22 15:18   Sodium 135 - 145 meq/L 139 142   Potassium 3.6 - 5.2 meq/L 4.3 4.0   Chloride 98 - 108 meq/L 106 106   Carbon Dioxide, Total 22 - 32 meq/L 21 (L) 23   Anion Gap 4 - 12  12 13  (H)   Glucose 62 - 125 mg/dL 161 98   Urea Nitrogen 8 - 21 mg/dL 37 (H) 21   Creatinine 0.51 - 1.18 mg/dL 0.96 (H) 0.45 (H)   eGFR by CKD-EPI 2021 >59 mL/min/1.73_m2 39 (L) 54 (L)   Calcium 8.9 - 10.2 mg/dL 9.8 9.7   (L): Data is abnormally low   Latest Reference Range & Units 05/20/21 14:44 11/25/21 14:01 12/01/22 16:27   Albumin (Micro), URN mg/dL 4.09 8.11 9.14   Albumin/Creatinine Ratio, URN <30 mg/gcreat 7 7 4      BP Readings from Last 3 Encounters:   12/19/22 132/84   12/19/22 119/81   12/01/22 136/90         Home BP: correlated cuff with clinic cuff last visit.  Not checking very often.    A/p  Diabetes: A1C in target off insulin, on weekly GLP1 RA  BP: Continue home monitoring alternate am/pm  Assist with follow up with [name] RD              01/09/2023 1:15 PM Ria Comment, MD; [facility] B2 4TH FLOOR MA; [facility] B2 4TH FLOOR FLEX ROOM [name]    03/09/2023 3:30 PM (Arrive by 3:15 PM) [name], MD [facility]

## 2023-01-09 ENCOUNTER — Ambulatory Visit
Payer: Medicare HMO | Attending: Student in an Organized Health Care Education/Training Program | Admitting: Student in an Organized Health Care Education/Training Program

## 2023-01-09 VITALS — BP 111/75 | HR 57 | Temp 98.5°F | Resp 16 | Ht 66.54 in | Wt 226.4 lb

## 2023-01-09 DIAGNOSIS — C61 Malignant neoplasm of prostate: Secondary | ICD-10-CM | POA: Insufficient documentation

## 2023-01-09 NOTE — Progress Notes (Unsigned)
[facility]  GENITOURINARY ONCOLOGY CLINIC NOTE    ONCOLOGY CARE TEAM:  Patient Care Team:  Autumn Patty, MD as Surgical Oncologist (Urology)  [name], MD as Radiation Oncologist (Radiation Oncology)  Ria Comment, MD as Oncologist (Hematology/Oncology)    REFERRED BY: Dr. [name]     IDENTIFICATION/CC:  Nathan [name] is a 72 year old male with new diagnosis of high risk localized (gleason 4+3=7, gg3, at least T3a, PSA 28.3) s/p SBRT to the prostate, SV, and pelvic lymph nodes completed on 06/30/2022 in combination with ADT, here to establish care for long course ADT.     CURRENT TREATMENT:  ADT since 05/26/2022  S/p SBRT to prostate, SV, and lymph nodes completed 06/30/2022    TREATMENT HISTORY:  Oncology History Overview   07/08/2021: PSA 28.19  11/25/2021: PSA 23.06  01/09/2022: PSA 28.33  01/24/2022: Prostate biopsy with gleason 4+3=7 prostate cancer in 11/12 cores, 30% pattern 4  02/14/2022: CT abdomen/pelvis shows sclerotic lesions in the pelvis perhaps concerning for prostate cancer  03/27/2023: PSMA PET shows tracer uptake in the PZ from apex to bilateral mid and L base extending to SV. No PSMA evident nodal or osseous met  05/26/2022: MRI Prostate with PIRADS 5 lesion, no macroscopic ECE, likely L SV invasion, no lympadenopathy or bone lesions. Starts ADT with lupron 22.5mg  q3 months  06/18/2022 -06/30/2022: SBRT to the prostate, SV, and pelvic lymph nodes + ADT, 4200cGy in 5 fractions   08/21/2022: PSA 0.18  12/01/2022: PSA undetectable         HPI:  Nathan [name] presents to establish care in medical oncology to take over administration of long course ADT for high risk localized prostate cancer. He has in general been feeling well on the treatment. He has taken this opportunity to change his lifestyle in regards to diet and exercise, and has eliminated processed foods and junk foods from his diet for the most part. Is eating lots more vegetables and whole foods, which is helping  with diabetes control and weight loss. Feels very well from a prostate cancer perspective, and is looking forward to going out fishing this summer.     REVIEW OF SYSTEMS:  A complete review of systems was completed and negative unless documented above in HPI    HOME MEDICATIONS:    Current Outpatient Medications:     acetaminophen 500 MG tablet, Take 2 tablets (1,000 mg) by mouth every 6 hours as needed for pain. (Patient not taking: Reported on 04/07/2022), Disp: 20 tablet, Rfl: 0    Albuterol Sulfate HFA 108 (90 Base) MCG/ACT Inhalation Aero Soln, Inhale 2 puffs by mouth every 6 hours as needed for shortness of breath/wheezing., Disp: 1 Inhaler, Rfl: 6    allopurinol 100 MG tablet, Take 2 tablets (200 mg) by mouth daily., Disp: 180 tablet, Rfl: 0    apixaban (Eliquis) 2.5 MG tablet, Take 1 tablet (2.5 mg) by mouth 2 times a day., Disp: 60 tablet, Rfl: 2    atorvastatin 40 MG tablet, Take 1 tablet (40 mg) by mouth every evening. To lower cholesterol., Disp: 90 tablet, Rfl: 1    blood glucose test strip, Use 1 strip 4 times a day. Use to check blood sugar., Disp: 400 strip, Rfl: 4    ciprofloxacin 500 MG tablet, Take 1 tablet (500 mg) by mouth 2 times a day. Continue taking until it is gone. (Patient not taking: Reported on 12/19/2022), Disp: 3 tablet, Rfl: 0  dulaglutide (Trulicity) 0.75 MG/0.5ML pen-injector, Inject 0.5 mL (0.75 mg) under the skin one time a week. 4 once weekly doses provided, Disp: 2 mL, Rfl: 0    enalapril 5 MG tablet, Take 0.5 tablets (2.5 mg) by mouth daily., Disp: 45 tablet, Rfl: 2    Fluticasone Propionate HFA 110 MCG/ACT Inhalation Aerosol, Inhale 1 puff by mouth every 12 hours., Disp: 1 Inhaler, Rfl: 6    glucometer (OneTouch Verio Flex System) w/Device kit, Use to check blood sugar 4 times a day., Disp: 1 kit, Rfl: 6    glucose 4 g chewable tablet, Chew and swallow 4 tablets (16 g) by mouth every 15 minutes as needed for low blood sugar. Recheck blood sugar as needed., Disp: 50 tablet,  Rfl: 3    ibuprofen 400 MG tablet, Take 1 tablet (400 mg) by mouth every 6 hours as needed for pain., Disp: 20 tablet, Rfl: 0    ketoconazole 2 % shampoo, apply topically face and scalp if needed, Disp: 120 mL, Rfl: 1    loratadine 10 MG tablet, Take 1 tablet (10 mg) by mouth daily., Disp: 30 tablet, Rfl: 1    metFORMIN 500 MG tablet, Take 1 tablet (500 mg) by mouth 2 times a day with meals., Disp: 180 tablet, Rfl: 0    Triamcinolone Acetonide 0.1 % External Ointment, Apply to affected area on neck 2 times a day., Disp: 15 g, Rfl: 0     PAST MEDICAL HISTORY:  Prostate cancer as above   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   CHEST: Normal work of breathing  NEURO: Appropriate gait. Moving all extremities spontaneously   MSK: Normal bulk and tone in all extremities  SKIN: No apparent rashes or lesions    ECOG: 1    LABORATORY STUDIES:  CBC:  Lab Results   Component Value Date/Time    WBC 4.16 (L) 12/01/2022 03:18 PM    HEMOGLOBIN 12.7 (L) 12/01/2022 03:18 PM    HEMATOCRIT 38 12/01/2022 03:18 PM    PLATELET 219 12/01/2022 03:18 PM     Metabolic panel:  Lab Results   Component Value Date/Time    POTASSIUM 4.0 12/01/2022 03:18 PM    CL 106 12/01/2022 03:18 PM    CO2 23 12/01/2022 03:18 PM    BUN 21 12/01/2022 03:18 PM    CREATININE 1.41 (H) 12/01/2022 03:18 PM    AST 15 12/01/2022 03:18 PM    ALT 14 12/01/2022 03:18 PM    ALK 72 12/01/2022 03:18 PM    BILIRUBN 0.6 12/01/2022 03:18 PM    ALBUMIN 4.2 12/01/2022 03:18 PM    PROTEIN 7.1 12/01/2022 03:18 PM     Tumor Markers:  Lab Results    Component Value Date/Time    PSA <0.03 12/01/2022 03:18 PM    PSA 0.18 08/21/2022 03:11 PM    PSA 28.33 (H) 01/09/2022 10:50 AM    PSA 23.06 (  H) 11/25/2021 02:01 PM    PSA 28.19 (H) 07/08/2021 02:22 PM     ASSESSMENT:  Nathan [name] is a 72 year old male with new diagnosis of high risk localized (gleason 4+3=7, gg3, at least T3a, PSA 28.3) s/p SBRT to the prostate, SV, and pelvic lymph nodes completed on 06/30/2022 in combination with ADT, here to establish care for long course ADT.     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 Is now s/p definitive radiation to the prostate, SV, and pelvic lymph nodes, in combination with ADT. We reviewed the treatment plan, which will be to continue with ADT x2 years, to end in 05/2024. He is tolerating ADT well and is remaining active, and actually is losing weight. Notably, he does not meet STAMPEDE criteria to add abi, as he does not have lymph node positive disease, and only meets 1/3 criteria (T3b disease, PSA >40, or gg4+ disease.)  -Next lupron 22.5mg  due 03/19/2023    Genetics   Meets criteria for germline genetic testing given high risk localized disease, will discuss at later date.     FOLLOW UP:  03/19/2023 for labs, provider visit, and lupron

## 2023-01-26 ENCOUNTER — Other Ambulatory Visit (HOSPITAL_BASED_OUTPATIENT_CLINIC_OR_DEPARTMENT_OTHER): Payer: Self-pay | Admitting: Internal Medicine

## 2023-01-26 DIAGNOSIS — E1165 Type 2 diabetes mellitus with hyperglycemia: Secondary | ICD-10-CM

## 2023-01-28 ENCOUNTER — Other Ambulatory Visit (HOSPITAL_BASED_OUTPATIENT_CLINIC_OR_DEPARTMENT_OTHER): Payer: Self-pay

## 2023-01-28 MED ORDER — TRULICITY 0.75 MG/0.5ML SC SOAJ
0.7500 mg | SUBCUTANEOUS | 0 refills | Status: DC
Start: 2023-01-28 — End: 2023-02-26
  Filled 2023-01-28: qty 2, 28d supply, fill #0

## 2023-01-29 ENCOUNTER — Other Ambulatory Visit (HOSPITAL_BASED_OUTPATIENT_CLINIC_OR_DEPARTMENT_OTHER): Payer: Self-pay

## 2023-02-18 ENCOUNTER — Telehealth (HOSPITAL_BASED_OUTPATIENT_CLINIC_OR_DEPARTMENT_OTHER): Payer: Self-pay | Admitting: Internal Medicine

## 2023-02-18 NOTE — Telephone Encounter (Signed)
RETURN CALL: Voicemail - Detailed Message      SUBJECT:  Medication Questions     NAME OF MEDICATION(S): allopurinol  ADDITIONAL INFORMATION: Xzorion is requesting a call back from the care team. [name] stated that [facility] wasn't able to refill prescription due to not being able to reach provider [name] clinic. Patient wants to know if clinic will be refilling prescription due to patient only having 7 tablets left. [name] is also requesting to speak with care team due to believing that the prescription is causing a cough. [name] wants to discuss the reaction with the care team. CCR was unable to transfer due to no answer. Please return call to confirm if patient should take the last 7 tablets and wait for refill, or how patient should continue going forward. Nathan [name]

## 2023-02-18 NOTE — Telephone Encounter (Signed)
RN returned patient call ( see note below). Patient stated that with taking Allopurinol has mild coughing x 4mos.     Patient stated that he only has 7 days left and inquiring if he should continue.     RN advised to complete 7 days and follow up with PCP on scheduled 03/09/23 appt.  RN advised note would be forwarded to PCP for further orders.  Patient verbalized understanding.         Earley Favor, [place], [id number] minutes ago (10:17 AM)     LS     RETURN CALL: Voicemail - Detailed Message        SUBJECT:  Medication Questions      NAME OF MEDICATION(S): allopurinol  ADDITIONAL INFORMATION: Murle is requesting a call back from the care team. Kerri stated that [facility] wasn't able to refill prescription due to not being able to reach provider [name] clinic. Patient wants to know if clinic will be refilling prescription due to patient only having 7 tablets left. Hridaan is also requesting to speak with care team due to believing that the prescription is causing a cough. Towan wants to discuss the reaction with the care team. CCR was unable to transfer due to no answer. Please return call to confirm if patient should take the last 7 tablets and wait for refill, or how patient should continue going forward. Marland Kitchen

## 2023-02-24 ENCOUNTER — Telehealth (HOSPITAL_BASED_OUTPATIENT_CLINIC_OR_DEPARTMENT_OTHER): Payer: Self-pay | Admitting: Internal Medicine

## 2023-02-24 NOTE — Telephone Encounter (Signed)
RETURN CALL: Voicemail - Detailed Message      SUBJECT:  General Message     MESSAGE: Molly Maduro from [employer] that he was calling to ask and seek a prior authorization for a glucose monitor glucometer (OneTouch Verio Flex System) w/Device kit

## 2023-02-26 ENCOUNTER — Other Ambulatory Visit (HOSPITAL_BASED_OUTPATIENT_CLINIC_OR_DEPARTMENT_OTHER): Payer: Self-pay | Admitting: Internal Medicine

## 2023-02-26 DIAGNOSIS — E1165 Type 2 diabetes mellitus with hyperglycemia: Secondary | ICD-10-CM

## 2023-03-02 ENCOUNTER — Other Ambulatory Visit (HOSPITAL_BASED_OUTPATIENT_CLINIC_OR_DEPARTMENT_OTHER): Payer: Self-pay

## 2023-03-02 MED ORDER — TRULICITY 0.75 MG/0.5ML SC SOAJ
0.7500 mg | SUBCUTANEOUS | 3 refills | Status: DC
Start: 2023-03-02 — End: 2023-06-15
  Filled 2023-03-02: qty 2, 28d supply, fill #0
  Filled 2023-03-28: qty 2, 28d supply, fill #1
  Filled 2023-04-20: qty 2, 28d supply, fill #2
  Filled 2023-05-18: qty 2, 28d supply, fill #3

## 2023-03-02 NOTE — Telephone Encounter (Signed)
Faxed prior authorization request to [employer] at [phone]

## 2023-03-03 ENCOUNTER — Encounter (HOSPITAL_BASED_OUTPATIENT_CLINIC_OR_DEPARTMENT_OTHER): Payer: Self-pay | Admitting: Internal Medicine

## 2023-03-03 NOTE — Progress Notes (Signed)
Received form from [employer]. Printed and placed in PCP box.

## 2023-03-04 ENCOUNTER — Other Ambulatory Visit (HOSPITAL_BASED_OUTPATIENT_CLINIC_OR_DEPARTMENT_OTHER): Payer: Self-pay

## 2023-03-05 NOTE — Progress Notes (Signed)
[facility]  GENITOURINARY ONCOLOGY CLINIC NOTE    ONCOLOGY CARE TEAM:  Patient Care Team:  Autumn Patty, MD as Surgical Oncologist (Urology)  [name], MD as Radiation Oncologist (Radiation Oncology)  Ria Comment, MD as Oncologist (Hematology/Oncology)    REFERRED BY: Dr. 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     CURRENT TREATMENT:  ADT since 05/26/2022  S/p SBRT to prostate, SV, and lymph nodes completed 06/30/2022    TREATMENT HISTORY:  Oncology History Overview   07/08/2021: PSA 28.19  11/25/2021: PSA 23.06  01/09/2022: PSA 28.33  01/24/2022: Prostate biopsy with gleason 4+3=7 prostate cancer in 11/12 cores, 30% pattern 4  02/14/2022: CT abdomen/pelvis shows sclerotic lesions in the pelvis perhaps concerning for prostate cancer  03/27/2023: PSMA PET shows tracer uptake in the PZ from apex to bilateral mid and L base extending to SV. No PSMA evident nodal or osseous met  05/26/2022: MRI Prostate with PIRADS 5 lesion, no macroscopic ECE, likely L SV invasion, no lympadenopathy or bone lesions. Starts ADT with lupron 22.5mg  q3 months  06/18/2022 -06/30/2022: SBRT to the prostate, SV, and pelvic lymph nodes + ADT, 4200cGy in 5 fractions   08/21/2022: PSA 0.18  12/01/2022: PSA undetectable         HPI:  Generally he reports he is doing stably. It seems that he has had two episodes of hematuria since his radiation, but otherwise has had no further, and otherwise denies significant urinary and bowel issues. Endorses infrequent hot flashes that he does not find troublesome. Has been staying active with fishing, but describes an unfortunate  encounter with another fisherman while fishing at [employer] in which that person was being rather disrespectful. He describes continuing to avoid junk foods and particular focus on avoidance of processed meats.     He tells me about cough that he has discussed with his PCP and this was described to him as being likely an ACE inhibitor cough.    REVIEW OF SYSTEMS:  A complete review of systems was completed and negative unless documented above in HPI    HOME MEDICATIONS:    Current Outpatient Medications:     acetaminophen 500 MG tablet, Take 2 tablets (1,000 mg) by mouth every 6 hours as needed for pain. (Patient not taking: Reported on 04/07/2022), Disp: 20 tablet, Rfl: 0    Albuterol Sulfate HFA 108 (90 Base) MCG/ACT Inhalation Aero Soln, Inhale 2 puffs by mouth every 6 hours as needed for shortness of breath/wheezing., Disp: 1 Inhaler, Rfl: 6    allopurinol 100 MG tablet, Take 2 tablets (200 mg) by mouth daily., Disp: 180 tablet, Rfl: 0    apixaban (Eliquis) 2.5 MG tablet, Take 1 tablet (2.5 mg) by mouth 2 times a day., Disp: 60 tablet, Rfl: 2    atorvastatin 40 MG tablet, Take 1 tablet (40 mg) by mouth every evening. To lower cholesterol., Disp: 90 tablet, Rfl: 1    blood glucose test strip, Use 1 strip 4 times a day. Use to check blood sugar., Disp: 400 strip, Rfl: 4    ciprofloxacin 500 MG tablet, Take 1  tablet (500 mg) by mouth 2 times a day. Continue taking until it is gone. (Patient not taking: Reported on 12/19/2022), Disp: 3 tablet, Rfl: 0    dulaglutide (Trulicity) 0.75 MG/0.5ML pen-injector, Inject 0.5 mL (0.75 mg) under the skin one time a week., Disp: 2 mL, Rfl: 3    enalapril 5 MG tablet, Take 0.5 tablets (2.5 mg) by mouth daily., Disp: 45 tablet, Rfl: 2    Fluticasone Propionate HFA 110 MCG/ACT Inhalation Aerosol, Inhale 1 puff by mouth every 12 hours., Disp: 1 Inhaler, Rfl: 6    glucometer (OneTouch Verio Flex System) w/Device kit, Use to check blood sugar 4 times a day., Disp: 1 kit, Rfl: 6     glucose 4 g chewable tablet, Chew and swallow 4 tablets (16 g) by mouth every 15 minutes as needed for low blood sugar. Recheck blood sugar as needed., Disp: 50 tablet, Rfl: 3    ibuprofen 400 MG tablet, Take 1 tablet (400 mg) by mouth every 6 hours as needed for pain., Disp: 20 tablet, Rfl: 0    ketoconazole 2 % shampoo, apply topically face and scalp if needed, Disp: 120 mL, Rfl: 1    loratadine 10 MG tablet, Take 1 tablet (10 mg) by mouth daily., Disp: 30 tablet, Rfl: 1    metFORMIN 500 MG tablet, Take 1 tablet (500 mg) by mouth 2 times a day with meals., Disp: 180 tablet, Rfl: 0    Triamcinolone Acetonide 0.1 % External Ointment, Apply to affected area on neck 2 times a day., Disp: 15 g, Rfl: 0     PAST MEDICAL HISTORY:  Prostate cancer as above   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   CHEST: Normal work of breathing  NEURO: Appropriate gait. Moving all extremities spontaneously   MSK: Normal bulk and tone in all extremities  SKIN: No apparent rashes or lesions    ECOG: 1    LABORATORY STUDIES:  CBC  Recent Labs     03/12/23  1212   WBC 4.67   RBC 4.20 L   HEMOGLOBIN 12.5 L   HEMATOCRIT 37 L   MCV 87   PLATELET 222     CMP  Recent Labs     03/12/23  1212   SODIUM 140   POTASSIUM 4.5   CL 108   CO2 23   IONGAP 9   GLUCOSE 104   BUN 31 H   CREATININE 1.74 H   GFR 42 L   CA 9.8   AST 18   ALT 14   ALK 74   BILIRUBN 0.5   PROTEIN 6.9   ALBUMIN 4.4     PSA  Recent Labs     03/12/23  1212 12/01/22  1518 08/21/22  1511 01/09/22  1050 11/25/21  1401 07/08/21  1422   PSA <0.03 <0.03 0.18  28.33 H 23.06 H 28.19 H     ASSESSMENT:  Jaymee Wooters is a 72 year old male with new diagnosis of 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 PSA now remaining undetected since 12/01/22, CBC and CMP with Cr 1.74 stable for recent baseline as seen in our lab draw, H/H 12.5/37; we'll continue to monitor all these. Overall he reports he is tolerating course stably with minimal hot flashes and is staying active.     Recall he was diagnosed about 1 year ago with gg3 disease with extracapsular extension and pT3a disease, possibly pT3b disease with some MRI evidence of extension into the L SV. Is now s/p definitive radiation to the prostate, SV, and pelvic lymph nodes, in combination with ADT. We reviewed the treatment plan, which will be to continue with ADT x2 years, to end in 05/2024. He is tolerating ADT well and is remaining active, and actually is losing weight. Notably, he does not meet STAMPEDE criteria to add abi, as he does not have lymph node positive disease, and only meets 1/3 criteria (T3b disease, PSA >40, or gg4+ disease.)  -Next lupron 22.5mg  due in 12 weeks, follow up then or sooner if needed.    Genetics   Meets criteria for germline genetic testing given high risk localized disease, will discuss at later date.     FOLLOW UP:  Next in 3 months at next lupron dose, sooner if needed.

## 2023-03-08 NOTE — Progress Notes (Unsigned)
Adult Medicine Clinic - Outpatient Return Clinic Note    Date:  03/09/2023    Nathan [name] is a 72 year old male who is here today for   Chief Complaint   Patient presents with    Diabetes     INTERVAL HISTORY/HPI:    Mr. Bobbit is a 72 year old man hospitalized 4/14 - 11/05/20 for syncope, PE, NSTEMI, Hyoxic resp failure, HHS, Newly diagnosed DM, Asthma, AKI/CKD, and gout.      Ikechi arrives for a follow-up clinic visit.  He reported that someone from [place] [facility] has interacted with him and apparently has obtained blood and urine specimen.  He presented a letter that had a urine albumin to creatinine ratio of approximately 30.  There was also a letter that indicated that he would require a prior authorization for ongoing use of continuous glucose monitors (CGM).  During the visit I directly called the number that he had for preauthorization and after approximately 10 minutes of speaking to one person, providing a significant amount of information,  she then indicated that she would not be the appropriate person and transferred me to another number where there was no answer after several minutes of ringing.  At that point I had to and the attempted preauthorization and I asked our pharmacist if there would be some assistance that could be provided.    I indicated to Nalu that if the preauthorization was not approved then I asked if he was prepared to check his blood sugars in the traditional fashion.  He stated that he would consider doing this.    He indicated that he was having a dry cough and he thought that it may have been due to allopurinol.  I let him know that I thought enalapril would be more likely a culprit.  I stated that I would review this with our pharmacist and consider a change to a medication without associated cough (ARB).  Discussed with Boneta Lucks in [facility].      He confirmed that he is taking Eliquis.    He is receiving treatment with Lupron (Leuprolide) negative feedback LHRH  agonist, 22.5 mg q 3 months.       Allergies: Grass and cherry blossoms. He reports they have been kicking in late November. Not allergic to ASA       SH:  He has been staying at home.  He lives at home alone, likes to fish in the warmer months.  He states he believes in [place], and lives on a fixed income.  He likes to "speak when spoken to".     PROBLEM LIST: See Epic Problem List    MEDICATIONS:    Current Outpatient Medications:     acetaminophen 500 MG tablet, Take 2 tablets (1,000 mg) by mouth every 6 hours as needed for pain. (Patient not taking: Reported on 04/07/2022), Disp: 20 tablet, Rfl: 0    Albuterol Sulfate HFA 108 (90 Base) MCG/ACT Inhalation Aero Soln, Inhale 2 puffs by mouth every 6 hours as needed for shortness of breath/wheezing., Disp: 1 Inhaler, Rfl: 6    allopurinol 100 MG tablet, Take 2 tablets (200 mg) by mouth daily., Disp: 180 tablet, Rfl: 0    apixaban (Eliquis) 2.5 MG tablet, Take 1 tablet (2.5 mg) by mouth 2 times a day., Disp: 60 tablet, Rfl: 2    atorvastatin 40 MG tablet, Take 1 tablet (40 mg) by mouth every evening. To lower cholesterol., Disp: 90 tablet, Rfl: 1  blood glucose test strip, Use 1 strip 4 times a day. Use to check blood sugar., Disp: 400 strip, Rfl: 4    ciprofloxacin 500 MG tablet, Take 1 tablet (500 mg) by mouth 2 times a day. Continue taking until it is gone. (Patient not taking: Reported on 12/19/2022), Disp: 3 tablet, Rfl: 0    dulaglutide (Trulicity) 0.75 MG/0.5ML pen-injector, Inject 0.5 mL (0.75 mg) under the skin one time a week., Disp: 2 mL, Rfl: 3    enalapril 5 MG tablet, Take 0.5 tablets (2.5 mg) by mouth daily., Disp: 45 tablet, Rfl: 2    Fluticasone Propionate HFA 110 MCG/ACT Inhalation Aerosol, Inhale 1 puff by mouth every 12 hours., Disp: 1 Inhaler, Rfl: 6    glucometer (OneTouch Verio Flex System) w/Device kit, Use to check blood sugar 4 times a day., Disp: 1 kit, Rfl: 6    glucose 4 g chewable tablet, Chew and swallow 4 tablets (16 g) by mouth every  15 minutes as needed for low blood sugar. Recheck blood sugar as needed., Disp: 50 tablet, Rfl: 3    ibuprofen 400 MG tablet, Take 1 tablet (400 mg) by mouth every 6 hours as needed for pain., Disp: 20 tablet, Rfl: 0    ketoconazole 2 % shampoo, apply topically face and scalp if needed, Disp: 120 mL, Rfl: 1    loratadine 10 MG tablet, Take 1 tablet (10 mg) by mouth daily., Disp: 30 tablet, Rfl: 1    metFORMIN 500 MG tablet, Take 1 tablet (500 mg) by mouth 2 times a day with meals., Disp: 180 tablet, Rfl: 0    Triamcinolone Acetonide 0.1 % External Ointment, Apply to affected area on neck 2 times a day., Disp: 15 g, Rfl: 0       ALLERGIES:  Review of patient's allergies indicates:  No Known Allergies    PHYSICAL EXAM:    BP 115/62   Pulse (!) 55   Temp 36.5 C (Temporal)   Resp 16   Ht 5\' 6"  (1.676 m)   Wt (!) 101.6 kg (224 lb)   SpO2 97% Comment: at RA, resting  BMI 36.15 kg/m     NAD  Alert and interactive  Lungs clear without wheezing  COR RRR w/o  Flat polypoid skin tag on mid- low back 5 mm  (cryotherapy applied) - resolved.  LE with compression stocking, no significant edema.      LAB AND IMAGING RESULTS:  Orders Only on 12/01/22   1. Prostate Specific Antigen   Result Value Ref Range    PSA, Diagnostic/Monitoring <0.03 0.00 - 4.00 ng/mL   2. Comprehensive Metabolic Panel   Result Value Ref Range    Sodium 142 135 - 145 meq/L    Potassium 4.0 3.6 - 5.2 meq/L    Chloride 106 98 - 108 meq/L    Carbon Dioxide, Total 23 22 - 32 meq/L    Anion Gap 13 (H) 4 - 12    Glucose 98 62 - 125 mg/dL    Urea Nitrogen 21 8 - 21 mg/dL    Creatinine 1.61 (H) 0.51 - 1.18 mg/dL    Protein (Total) 7.1 6.0 - 8.2 g/dL    Albumin 4.2 3.5 - 5.2 g/dL    Bilirubin (Total) 0.6 0.2 - 1.3 mg/dL    Calcium 9.7 8.9 - 09.6 mg/dL    AST (GOT) 15 9 - 38 U/L    Alkaline Phosphatase (Total) 72 36 - 161 U/L    ALT (GPT) 14  10 - 48 U/L    eGFR by CKD-EPI 2021 54 (L) >59 mL/min/1.73_m2   3. CBC   Result Value Ref Range    WBC 4.16 (L) 4.3 -  10.0 10*3/uL    RBC 4.21 (L) 4.40 - 5.60 10*6/uL    Hemoglobin 12.7 (L) 13.0 - 18.0 g/dL    Hematocrit 38 16.1 - 50.0 %    MCV 90 81 - 98 fL    MCH 30.2 27.3 - 33.6 pg    MCHC 33.7 32.2 - 36.5 g/dL    Platelet Count 096 045 - 400 10*3/uL    RDW-CV 12.9 11.0 - 14.5 %          ASSESSMENT AND PLAN:    1.  Diabetes:  Dulaglutide = Trulicity 0.75 mg sc q wk and metformin 500 mg bid.   To see [facility] pharmacy next week for dose adjustment.       Foot Exam - 11/26/20, 07/04/21, 11/25/21 completed.   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  Mixed type cataracts, both eyes, mild. Monitor. Next visit 11/20/22.  Alb/Creat = 7 (11/25/21)       A1C UWM AMB    A1C   Latest Ref Rng 4.0 - 6.0 %   05/20/2021 6.1 (H)    11/25/2021 6.1 (H)    06/09/2022 6.0    12/01/2022 5.4        2. 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  He is currently taking 2.5 mg daily and the script is written for 2.5 mg bid.       3. Asthma.  He uses -Fluticasone 110 mcg BID  -Albuterol Q6hr PRN and has a stable pattern. PFTs (08/29/22):  FEV1/FVC 88%, FEVI 64% predicted.  Interpretation:  Narrative & Impression: No obstructive disorder. Spirometry suggests a possible mild restrictive disorder.        4. Cardiac Risk Factors.  BP 124/62 off meds, BMI 47, LDL 96, DM no, No FH CAD, No tobacco. No MI or Stroke.  EKG (11/01/21): NSR, Axis - 52 (LAD), PR N, QRS 168, QTc 459, RBBB, LAHB, No Q-waves. ASCVD Risk 9.8%.  Atorvastatin 40 mg qd.       Cholesterol     LDL Cholesterol   Latest Ref Rng <130 mg/dL   10/27/8117 147    82/95/6213 62    06/09/2022 74       5. HTN. Enalapril 2.5 mg daily begun 06/2022.  Self report of dry cough.  Will consider change to ARB with next pharmacy check in.       Vitals     Systolic Diastolic   12/19/2022 132  84     119  81    01/09/2023 111  75    03/09/2023 115  62         6. Cardiac Function     Cards note 11/01/20:  MICU for hypotension and respiratory failure found to have troponin elevation of 0.17-->1.6 found to have new severe RV dilation and systolic dysfunction found to have large PE. Also considered primary ACS however patient is not in any chest pain, likely location the right sided or posterior however EKG is not presently suggestive of ACS in such anatomic distribution. Patient does have risk factors for ACS including diabetes, obesity, and tobacco history. Given that he has large PE with RV dysfunction I suspect his troponin is secondary  to PE.     11/01/20: ECHO: Conclusion  1. The left ventricle is normal in size. There is normal left ventricular  wall thickness. Global left ventricular function is normal (biplane EF 68%).  No regional wall motion abnormalities are present.  2. The right ventricle is severely dilated. The right ventricular systolic  function is severely decreased. Right ventricular segmental wall motion  abnormalities are present with decreased function of the mid segment. The  right atrium is mildly dilated.  3. Normal valve structure and function.  4. The pulmonary artery systolic pressure is borderline elevated at 32 mmHg.  5, There is no pericardial effusion.     08/06/21: ECHO:  Conclusion: Normal LV size with mildly reduced systolic function. Regional wall motion abnormalities shown below.  Normal RV size and systolic function. Normal RA pressure. Could not evaluate PA pressure. RV/LV ratio 0.7 which is normal. Normal valve structure and function.  Compared with prior study from 4/14//22, RV size and systolic function has improved.      6. CKD (Grade 3a).  Stable pattern over time.   Currently on ACE-I, likely change to ARB.    Creatinine 1.60 - 1.45 mg/dL    GFR 44 - 56 ml/min (as of 06/09/22)  Prot / Creat < 0.1 (02/22/16)   Alb/Creat 7 (05/20/21)      Renal Function Testing    Creatinine   Latest Ref Rng 0.51 - 1.18 mg/dL   9/81/1914 1.6 (H)    05/26/2022 1.5 !    06/09/2022 1.57 (H)     08/21/2022 1.85 (H)    12/01/2022 1.41 (H)       GFR 54 (12/01/22)  Stage 3a.       7.  Prostate Cancer:  No FH of prostate CA.   He elects to obtain a PSA after SDM.  (07/04/21)  PSA 28.19 (referral sent on 07/10/21, patients questions were addressed but closed.)  11/25/21:  Again SDM today with patient agreement to proceed with referral to Urology.  He is undergoing therapy for prostate cancer (XRT and Lupron).      PSA [facility] AMB    Prostate Specific Antigen   Latest Ref Rng 0.00 - 4.00 ng/mL   07/08/2021 28.19 (H)    11/25/2021 23.06 (H)    01/09/2022 28.33 (H)    08/21/2022 0.18    12/01/2022 <0.03        8. Chronic gout.  He report intermittent foot pain but is non-specific on interview.  Records indicate last flare podagra (L great toe) in June 2017.  He is minimizing red meat, and no alcohol.  He has CKD therefore goal is to use tylenol over NSAIDs for pain.  He is on Allopurinol 200 mg daily, Colchicine 0.6 mg 2 tabs for flare, then 1 tab 1 hr later.  Uric Acid 7.4 (07/07/18).  CrCl at 50 mL/min, allopurinol and colchicine doses acceptable.  ASA has been stopped due to gout and CKD.  (stable 03/09/23)     9. Tinea Capitus.  Ketoconazole shampoo      10.  Derm.  He has multiple skin tags.  Cryotherapy treatment previously provided.       11.  Social:  He has no DPOA.  But his brother Loraine Leriche who lives in [place] calls and checks on him the most.  He would want Loraine Leriche involved if her were unable to make decisions for himself.         HEALTH CARE MAINTANENCE / SCREENING:  (See  below)      PHQ 2 Screening:         PHQ-9     PHQ2 Total Score   07/11/2019 0    04/23/2022 0    06/30/2022 0       Colorectal Cancer Screening:  FIT negative (07/23/18 and 07/10/21). Patient declines colonoscopy.  2017 - 07/10/21: FIT Negative x 5.  FIT ordered 09/22/22.      Tobacco Use:  Quit at age 17.  ETOH Use:  Occ use   Marijuana:  Occ      Hepatitis C / HIV Screening:  HIV negative 2013.  Hep C (1945-65 + high risk): Neg 2013       He declines a flu  vaccine. Bivalent COVID booster. (07/04/21).        Declines COVID and Flu.  RSV at the local pharmacy.  He declines all today.  (06/09/22).     Pneumococcal:   IF Age > 65.          1) If PPSV23 Only, PCV20 > 1 yr;  2)  If Both PPSV23/PCV13, PCV20 after 5 yrs after last pnx (SDM if PPSV23 last given was > 65); 3) If NO prior pnx - Give PCV20: Due for PCV20. - written for upcoming pharmacy visit.     Shingrix (> Age 3, +/- Zostavax or shingles) - 2 doses:  Will discuss with patient.    Tdap, then Td every 10 yrs: Tdap 2012, due for Td.  Written to be given at 03/19/23 [facility] pharmacy visit.   Influenza: Plan for fall 2024  COVID: Plan for fall 2024  RSV: Not obtained    Immunization History   Administered Date(s) Administered    COVID-19 Moderna mRNA monovalent 12 yrs and older 09/20/2019, 10/18/2019    COVID-19 Pfizer mRNA bivalent 12 yrs and older 07/04/2021    COVID-19 Pfizer mRNA monovalent tris-sucrose 12 yrs and older 11/26/2020    Hepatitis B, unspecified 10/05/2007, 11/05/2007, 05/08/2008    Influenza quadrivalent PF 04/19/2013, 06/12/2014, 05/01/2015    Influenza quadrivalent adjuvanted (Fluad) 05/19/2019    Influenza trivalent PF 07/07/2012    Influenza, unspecified 09/26/2010    Pneumococcal polysaccharide PPSV23 (Pneumovax 23) 02/12/2009    Tdap 01/21/2011     Future Appointments   Date Time Provider Department Center   03/09/2023  3:30 PM [name], MD [facility] AM   03/12/2023 12:00 PM [facility] GU VISIT [id number] [facility]   03/12/2023 12:45 PM [facility] GU VISIT [id number]   03/12/2023  1:15 PM Pietro Cassis, ARNP [facility]     F/U Dr. Renaldo Harrison in x months.    Total time spent in the direct care of this patient and in reviewing records and documentation on the date of service was 25 minutes.

## 2023-03-09 ENCOUNTER — Ambulatory Visit: Payer: Medicare HMO | Attending: Internal Medicine | Admitting: Internal Medicine

## 2023-03-09 VITALS — BP 115/62 | HR 55 | Temp 97.7°F | Resp 16 | Ht 66.0 in | Wt 224.0 lb

## 2023-03-09 DIAGNOSIS — R972 Elevated prostate specific antigen [PSA]: Secondary | ICD-10-CM | POA: Insufficient documentation

## 2023-03-09 DIAGNOSIS — I129 Hypertensive chronic kidney disease with stage 1 through stage 4 chronic kidney disease, or unspecified chronic kidney disease: Secondary | ICD-10-CM

## 2023-03-09 DIAGNOSIS — C61 Malignant neoplasm of prostate: Secondary | ICD-10-CM | POA: Insufficient documentation

## 2023-03-09 DIAGNOSIS — J4520 Mild intermittent asthma, uncomplicated: Secondary | ICD-10-CM | POA: Insufficient documentation

## 2023-03-09 DIAGNOSIS — M1A9XX0 Chronic gout, unspecified, without tophus (tophi): Secondary | ICD-10-CM

## 2023-03-09 DIAGNOSIS — N1831 Chronic kidney disease, stage 3a: Secondary | ICD-10-CM

## 2023-03-09 DIAGNOSIS — E119 Type 2 diabetes mellitus without complications: Secondary | ICD-10-CM

## 2023-03-09 DIAGNOSIS — Z7189 Other specified counseling: Secondary | ICD-10-CM | POA: Insufficient documentation

## 2023-03-09 DIAGNOSIS — Z794 Long term (current) use of insulin: Secondary | ICD-10-CM | POA: Insufficient documentation

## 2023-03-09 DIAGNOSIS — E1165 Type 2 diabetes mellitus with hyperglycemia: Secondary | ICD-10-CM | POA: Insufficient documentation

## 2023-03-09 DIAGNOSIS — Z0000 Encounter for general adult medical examination without abnormal findings: Secondary | ICD-10-CM | POA: Insufficient documentation

## 2023-03-09 DIAGNOSIS — I2699 Other pulmonary embolism without acute cor pulmonale: Secondary | ICD-10-CM | POA: Insufficient documentation

## 2023-03-09 DIAGNOSIS — B350 Tinea barbae and tinea capitis: Secondary | ICD-10-CM

## 2023-03-09 NOTE — Patient Instructions (Signed)
I will review your medications and likely change the Enalapril due to cough.      I will send a referral to pharmacy for ongoing diabetes management.      We are working on a prior authorization for a glucose monitor.     I will see you in about 3 months.

## 2023-03-11 ENCOUNTER — Encounter (HOSPITAL_BASED_OUTPATIENT_CLINIC_OR_DEPARTMENT_OTHER): Payer: Self-pay | Admitting: Internal Medicine

## 2023-03-11 DIAGNOSIS — Z7189 Other specified counseling: Secondary | ICD-10-CM | POA: Insufficient documentation

## 2023-03-11 MED ORDER — PNEUMOCOCCAL 20-VAL CONJ VACC 0.5 ML IM SUSY
0.5000 mL | PREFILLED_SYRINGE | Freq: Once | INTRAMUSCULAR | Status: AC
Start: 2023-03-11 — End: 2024-03-10

## 2023-03-11 MED ORDER — TENIVAC 5-2 LFU IM INJ
0.5000 mL | INJECTION | Freq: Once | INTRAMUSCULAR | 0 refills | Status: AC
Start: 2023-03-11 — End: 2023-03-11

## 2023-03-11 NOTE — Progress Notes (Signed)
Received forms from [employer]. Printed and placed in pcp box.

## 2023-03-12 ENCOUNTER — Ambulatory Visit: Payer: Medicare HMO | Attending: Student in an Organized Health Care Education/Training Program

## 2023-03-12 ENCOUNTER — Ambulatory Visit (HOSPITAL_BASED_OUTPATIENT_CLINIC_OR_DEPARTMENT_OTHER): Payer: Medicare HMO

## 2023-03-12 VITALS — BP 108/71 | HR 62 | Temp 98.4°F | Resp 16 | Wt 228.2 lb

## 2023-03-12 DIAGNOSIS — C61 Malignant neoplasm of prostate: Secondary | ICD-10-CM | POA: Insufficient documentation

## 2023-03-12 DIAGNOSIS — Z5111 Encounter for antineoplastic chemotherapy: Secondary | ICD-10-CM | POA: Insufficient documentation

## 2023-03-12 LAB — COMPREHENSIVE METABOLIC PANEL
ALT (GPT): 14 U/L (ref 10–48)
AST (GOT): 18 U/L (ref 9–38)
Albumin: 4.4 g/dL (ref 3.5–5.2)
Alkaline Phosphatase (Total): 74 U/L (ref 36–161)
Anion Gap: 9 (ref 4–12)
Bilirubin (Total): 0.5 mg/dL (ref 0.2–1.3)
Calcium: 9.8 mg/dL (ref 8.9–10.2)
Carbon Dioxide, Total: 23 meq/L (ref 22–32)
Chloride: 108 meq/L (ref 98–108)
Creatinine: 1.74 mg/dL — ABNORMAL HIGH (ref 0.51–1.18)
Glucose: 104 mg/dL (ref 62–125)
Potassium: 4.5 meq/L (ref 3.6–5.2)
Protein (Total): 6.9 g/dL (ref 6.0–8.2)
Sodium: 140 meq/L (ref 135–145)
Urea Nitrogen: 31 mg/dL — ABNORMAL HIGH (ref 8–21)
eGFR by CKD-EPI 2021: 42 mL/min/{1.73_m2} — ABNORMAL LOW (ref >59)

## 2023-03-12 LAB — CBC (HEMOGRAM)
Hematocrit: 37 % — ABNORMAL LOW (ref 38.0–50.0)
Hemoglobin: 12.5 g/dL — ABNORMAL LOW (ref 13.0–18.0)
MCH: 29.8 pg (ref 27.3–33.6)
MCHC: 34.2 g/dL (ref 32.2–36.5)
MCV: 87 fL (ref 81–98)
Platelet Count: 222 10*3/uL (ref 150–400)
RBC: 4.2 10*6/uL — ABNORMAL LOW (ref 4.40–5.60)
RDW-CV: 13.4 % (ref 11.0–14.5)
WBC: 4.67 10*3/uL (ref 4.3–10.0)

## 2023-03-12 LAB — PSA, DIAGNOSTIC/MONITORING: PSA, Diagnostic/Monitoring: <0.03 ng/mL (ref 0.00–4.00)

## 2023-03-12 MED ORDER — LEUPROLIDE ACETATE (3 MONTH) 22.5 MG IM KIT
22.5000 mg | PACK | Freq: Once | INTRAMUSCULAR | Status: AC
Start: 2023-03-12 — End: 2023-03-12
  Administered 2023-03-12: 22.5 mg via INTRAMUSCULAR
  Filled 2023-03-12: qty 1

## 2023-03-12 NOTE — Progress Notes (Signed)
Lupron administered IM to left ventrogluteal site. Tolerated well.

## 2023-03-16 ENCOUNTER — Encounter (HOSPITAL_BASED_OUTPATIENT_CLINIC_OR_DEPARTMENT_OTHER): Payer: Self-pay

## 2023-03-19 ENCOUNTER — Ambulatory Visit: Payer: Medicare HMO | Attending: Pharmacist | Admitting: Pharmacist

## 2023-03-19 ENCOUNTER — Other Ambulatory Visit (HOSPITAL_BASED_OUTPATIENT_CLINIC_OR_DEPARTMENT_OTHER): Payer: Self-pay

## 2023-03-19 ENCOUNTER — Telehealth (HOSPITAL_BASED_OUTPATIENT_CLINIC_OR_DEPARTMENT_OTHER): Payer: Self-pay | Admitting: Ambulatory Care

## 2023-03-19 VITALS — BP 117/85 | HR 66 | Temp 98.4°F | Resp 16 | Ht 66.0 in | Wt 227.0 lb

## 2023-03-19 DIAGNOSIS — Z794 Long term (current) use of insulin: Secondary | ICD-10-CM | POA: Insufficient documentation

## 2023-03-19 DIAGNOSIS — Z79899 Other long term (current) drug therapy: Secondary | ICD-10-CM | POA: Insufficient documentation

## 2023-03-19 DIAGNOSIS — E119 Type 2 diabetes mellitus without complications: Secondary | ICD-10-CM | POA: Insufficient documentation

## 2023-03-19 DIAGNOSIS — E1165 Type 2 diabetes mellitus with hyperglycemia: Secondary | ICD-10-CM | POA: Insufficient documentation

## 2023-03-19 DIAGNOSIS — Z5181 Encounter for therapeutic drug level monitoring: Secondary | ICD-10-CM | POA: Insufficient documentation

## 2023-03-19 NOTE — Progress Notes (Deleted)
Visit type: Appointed  Interpreter present: None  Referral to pharmacy by Dr. Renaldo Harrison on 02/19/23 for DM    ASSESSMENT/PLAN     #Type 2 diabetes  Well controlled, last A1c = 5.4, at goal <7.     **med list clean up    Follow up:   ***    SUBJECTIVE   Nathan [name] is a 72 year old male here today to see pharmacist for initial visit for No chief complaint on file.      PMH: STEMI + PE hx (2022), asthma, CKD, gout, T2DM (dx 2020)    Interval history: last seen by PCP 03/11/23, CGM no longer going to be covered after discontinuing insulin.     ***     Lifestyle:  - Diet:  - Exercise:  - Smoking:      Current DM medications:   - metformin IR 500 mg BID  - Trulicity 0.75 mg weekly   Previously: Humulin 70/30 (over controlled)     Review of patient's allergies indicates:  No Known Allergies    OBJECTIVE     BP Readings from Last 3 Encounters:   03/12/23 108/71   03/09/23 115/62   01/09/23 111/75     Pulse Readings from Last 3 Encounters:   03/12/23 62   03/09/23 (!) 55   01/09/23 (!) 57     Wt Readings from Last 3 Encounters:   03/12/23 (!) 103.5 kg (228 lb 3.2 oz)   03/09/23 (!) 101.6 kg (224 lb)   01/09/23 (!) 102.7 kg (226 lb 6.4 oz)       Recent Labs     03/12/23  1212 12/01/22  1518 08/21/22  1511   SODIUM 140 142 139   POTASSIUM 4.5 4.0 4.3   CL 108 106 106   CO2 23 23 21  L   IONGAP 9 13 H 12   GLUCOSE 104 98 120   BUN 31 H 21 37 H   CREATININE 1.74 H 1.41 H 1.85 H   GFR 42 L 54 L 39 L       Lab Results   Component Value Date    A1C 5.4 12/01/2022    A1C 6.0 06/09/2022    A1C 6.1 11/25/2021       The 10-year ASCVD risk score (Arnett DK, et al., 2019) is: 23.7%    Values used to calculate the score:      Age: 34 years      Sex: Male      Is Non-Hispanic African American: Yes      Diabetic: Yes      Tobacco smoker: No      Systolic Blood Pressure: 108 mmHg      Is BP treated: Yes      HDL Cholesterol: 46 mg/dL      Total Cholesterol: 146 mg/dL    Lab Results   Component Value Date    ALBCREATRATU 4 12/01/2022     ALBCREATRATU 7 11/25/2021    ALBCREATRATU 7 05/20/2021       Foot Exam: due 08/2023  Eye Exam: due 11/2023  Immunizations: Tdap, Shingrix, PCV, RSV due    Medications at End of Visit:  Current Outpatient Medications   Medication Sig Dispense Refill    acetaminophen 500 MG tablet Take 2 tablets (1,000 mg) by mouth every 6 hours as needed for pain. (Patient not taking: Reported on 04/07/2022) 20 tablet 0    Albuterol Sulfate HFA 108 (90 Base) MCG/ACT Inhalation  Aero Soln Inhale 2 puffs by mouth every 6 hours as needed for shortness of breath/wheezing. 1 Inhaler 6    allopurinol 100 MG tablet Take 2 tablets (200 mg) by mouth daily. 180 tablet 0    apixaban (Eliquis) 2.5 MG tablet Take 1 tablet (2.5 mg) by mouth 2 times a day. 60 tablet 2    atorvastatin 40 MG tablet Take 1 tablet (40 mg) by mouth every evening. To lower cholesterol. 90 tablet 1    blood glucose test strip Use 1 strip 4 times a day. Use to check blood sugar. 400 strip 4    ciprofloxacin 500 MG tablet Take 1 tablet (500 mg) by mouth 2 times a day. Continue taking until it is gone. (Patient not taking: Reported on 12/19/2022) 3 tablet 0    dulaglutide (Trulicity) 0.75 MG/0.5ML pen-injector Inject 0.5 mL (0.75 mg) under the skin one time a week. 2 mL 3    enalapril 5 MG tablet Take 0.5 tablets (2.5 mg) by mouth daily. 45 tablet 2    Fluticasone Propionate HFA 110 MCG/ACT Inhalation Aerosol Inhale 1 puff by mouth every 12 hours. 1 Inhaler 6    glucometer (OneTouch Verio Flex System) w/Device kit Use to check blood sugar 4 times a day. 1 kit 6    glucose 4 g chewable tablet Chew and swallow 4 tablets (16 g) by mouth every 15 minutes as needed for low blood sugar. Recheck blood sugar as needed. 50 tablet 3    ibuprofen 400 MG tablet Take 1 tablet (400 mg) by mouth every 6 hours as needed for pain. 20 tablet 0    ketoconazole 2 % shampoo apply topically face and scalp if needed 120 mL 1    loratadine 10 MG tablet Take 1 tablet (10 mg) by mouth daily. 30 tablet 1     metFORMIN 500 MG tablet Take 1 tablet (500 mg) by mouth 2 times a day with meals. 180 tablet 0    Triamcinolone Acetonide 0.1 % External Ointment Apply to affected area on neck 2 times a day. 15 g 0     Current Facility-Administered Medications   Medication Dose Route Frequency Provider Last Rate Last Admin    pneumococcal 20-valent conjugate vaccine (Prevnar 20) injection 0.5 mL  0.5 mL Intramuscular Once [name], MD           I spent a total of *** minutes for the patient's care on the date of the service.  {Vanishing Tip  When performed by provider on date of visit, these count toward total time  Chart review   History taking  Physical exam  Counseling, educating patient/family/caregiver  Orders   Referrals and communication with other providers (not separately reported)  Documentation  Care coordination (not separately reported)  Independent interpretation of results (not separately reported) and communicating results to patient/family/caregiver  :999}      Mickel Duhamel, PharmD  PGY-2 Resident - Adult Medicine Clinic

## 2023-03-19 NOTE — Telephone Encounter (Signed)
Outreach to pt to remind him of pharmacy visit today. Requested pt to bring all meds, he states what we have on Epic is accurate. Will only be bringing the medications he has questions about.

## 2023-03-19 NOTE — Progress Notes (Signed)
Visit type: Appointed  Interpreter present: None  Referral written by Lyn Henri, MD on 03/11/23    ASSESSMENT/PLAN   #T2DM: very well controlled; last A1c well within goal of <7%; TIR 100% w/out much variability  - CGM: we discussed if not on insulin (and absence of hx severe hypoglycemia) Nathan [name] would not be covered by insurance; also do not feel he needs it given current control; should be fine for intermittent or even no FS testing  - Metformin: discussed typically not associated w/ the cough he is describing (if anything, likely enalapril); he'd like to see if it's the metformin but is open to exploring the enalapril at a later visit     - Stop metformin; but we may end up restarting in the future if turns out not to be causing cough    #HCM: discussed due for some vaccines (and looks like Prevnar 20 ordered); he declines all d/t his experiences w/ Covid vaccine in 2022    Follow up:   He's open to seeing [place] in 1 month    SUBJECTIVE   Nathan [name] is a 72 year old male here today to see pharmacist for initial visit for Diabetes    Patient has a couple of things he wants to discuss after last PCP visit.  Brings only the medication in question (metformin, see TE from today) and a CGM sensor.    Brings metformin only. Feels like this is causing a persistent dry cough. Throughout the day. Seems to have started a couple of weeks after starting metformin. Also taking dulaglutide 0.75 mg weekly for DM and tolerating fine.    Reports that ins stopped covering his [place] sensors. Bonita Quin told him that might happen and he would potentially have to do fingersticks. He has a meter and supplies at home.    Also on enalapril 2.5 mg daily. He does not feel this is associated w/ the dry cough.     Review of patient's allergies indicates:  No Known Allergies      OBJECTIVE         BP Readings from Last 3 Encounters:   03/19/23 117/85   03/12/23 108/71   03/09/23 115/62     Pulse Readings from Last 3 Encounters:   03/19/23 66    03/12/23 62   03/09/23 (!) 55     Wt Readings from Last 3 Encounters:   03/19/23 (!) 103 kg (227 lb)   03/12/23 (!) 103.5 kg (228 lb 3.2 oz)   03/09/23 (!) 101.6 kg (224 lb)       Recent Labs     03/12/23  1212 12/01/22  1518 08/21/22  1511   SODIUM 140 142 139   POTASSIUM 4.5 4.0 4.3   CL 108 106 106   CO2 23 23 21  L   IONGAP 9 13 H 12   GLUCOSE 104 98 120   BUN 31 H 21 37 H   CREATININE 1.74 H 1.41 H 1.85 H   GFR 42 L 54 L 39 L       Lab Results   Component Value Date    A1C 5.4 12/01/2022    A1C 6.0 06/09/2022    A1C 6.1 11/25/2021       The 10-year ASCVD risk score (Arnett DK, et al., 2019) is: 27%    Values used to calculate the score:      Age: 41 years      Sex: Male  Is Non-Hispanic African American: Yes      Diabetic: Yes      Tobacco smoker: No      Systolic Blood Pressure: 117 mmHg      Is BP treated: Yes      HDL Cholesterol: 46 mg/dL      Total Cholesterol: 146 mg/dL    Lab Results   Component Value Date    ALBCREATRATU 4 12/01/2022    ALBCREATRATU 7 11/25/2021    ALBCREATRATU 7 05/20/2021     Immunizations:Tdap, Covid, Pneumococcal, Shingles - patient declines today d/t a poor experience w/ Covid shot in 2022    Medications at End of Visit:  Current Outpatient Medications   Medication Sig Dispense Refill    acetaminophen 500 MG tablet Take 2 tablets (1,000 mg) by mouth every 6 hours as needed for pain. (Patient not taking: Reported on 04/07/2022) 20 tablet 0    Albuterol Sulfate HFA 108 (90 Base) MCG/ACT Inhalation Aero Soln Inhale 2 puffs by mouth every 6 hours as needed for shortness of breath/wheezing. 1 Inhaler 6    allopurinol 100 MG tablet Take 2 tablets (200 mg) by mouth daily. 180 tablet 0    apixaban (Eliquis) 2.5 MG tablet Take 1 tablet (2.5 mg) by mouth 2 times a day. 60 tablet 2    atorvastatin 40 MG tablet Take 1 tablet (40 mg) by mouth every evening. To lower cholesterol. 90 tablet 1    blood glucose test strip Use 1 strip 4 times a day. Use to check blood sugar. 400 strip 4     ciprofloxacin 500 MG tablet Take 1 tablet (500 mg) by mouth 2 times a day. Continue taking until it is gone. (Patient not taking: Reported on 12/19/2022) 3 tablet 0    dulaglutide (Trulicity) 0.75 MG/0.5ML pen-injector Inject 0.5 mL (0.75 mg) under the skin one time a week. 2 mL 3    enalapril 5 MG tablet Take 0.5 tablets (2.5 mg) by mouth daily. 45 tablet 2    Fluticasone Propionate HFA 110 MCG/ACT Inhalation Aerosol Inhale 1 puff by mouth every 12 hours. 1 Inhaler 6    glucometer (OneTouch Verio Flex System) w/Device kit Use to check blood sugar 4 times a day. 1 kit 6    glucose 4 g chewable tablet Chew and swallow 4 tablets (16 g) by mouth every 15 minutes as needed for low blood sugar. Recheck blood sugar as needed. 50 tablet 3    ibuprofen 400 MG tablet Take 1 tablet (400 mg) by mouth every 6 hours as needed for pain. 20 tablet 0    ketoconazole 2 % shampoo apply topically face and scalp if needed 120 mL 1    loratadine 10 MG tablet Take 1 tablet (10 mg) by mouth daily. 30 tablet 1    metFORMIN 500 MG tablet Take 1 tablet (500 mg) by mouth 2 times a day with meals. 180 tablet 0    Triamcinolone Acetonide 0.1 % External Ointment Apply to affected area on neck 2 times a day. 15 g 0     Current Facility-Administered Medications   Medication Dose Route Frequency Provider Last Rate Last Admin    pneumococcal 20-valent conjugate vaccine (Prevnar 20) injection 0.5 mL  0.5 mL Intramuscular Once [name], MD           I spent a total of 50 minutes for the patient's care on the date of the service.        Kandice Hams,  PharmD

## 2023-03-20 MED ORDER — GLUCOSE BLOOD VI STRP: ORAL_STRIP | 1 refills | Status: DC

## 2023-03-28 ENCOUNTER — Other Ambulatory Visit (HOSPITAL_BASED_OUTPATIENT_CLINIC_OR_DEPARTMENT_OTHER): Payer: Self-pay

## 2023-03-30 ENCOUNTER — Other Ambulatory Visit (HOSPITAL_BASED_OUTPATIENT_CLINIC_OR_DEPARTMENT_OTHER): Payer: Self-pay

## 2023-04-20 ENCOUNTER — Other Ambulatory Visit (HOSPITAL_BASED_OUTPATIENT_CLINIC_OR_DEPARTMENT_OTHER): Payer: Self-pay

## 2023-04-20 ENCOUNTER — Ambulatory Visit: Payer: Medicare HMO | Attending: Ambulatory Care | Admitting: Ambulatory Care

## 2023-04-20 VITALS — BP 115/76 | HR 60 | Temp 97.7°F | Wt 227.0 lb

## 2023-04-20 DIAGNOSIS — E119 Type 2 diabetes mellitus without complications: Secondary | ICD-10-CM | POA: Insufficient documentation

## 2023-04-20 DIAGNOSIS — Z7985 Long-term (current) use of injectable non-insulin antidiabetic drugs: Secondary | ICD-10-CM | POA: Insufficient documentation

## 2023-04-20 DIAGNOSIS — Z5181 Encounter for therapeutic drug level monitoring: Secondary | ICD-10-CM | POA: Insufficient documentation

## 2023-04-20 DIAGNOSIS — Z79899 Other long term (current) drug therapy: Secondary | ICD-10-CM | POA: Insufficient documentation

## 2023-04-20 NOTE — Progress Notes (Signed)
Visit type: Appointed  Interpreter present: None  Referred to pharmacy 03/11/23 for DM and HTN     ASSESSMENT/PLAN     #T2DM: Last A1c at goal and home reading within postprandial goal < 180 mg/dL. Reviewed fasting and postprandial goals w/ pt. Cough seems to have improved after metformin cessation but continues to have persistent cough. Will not restart metformin at this time.    --Continue Trulicity 0.75 mg/wk  --Due for repeat A1c     #Persistent cough: Unclear if it is from metformin, discussed that possibility more likely from ACEI. Requests pt to return w/ all bottles of medications. No changes made today.     Follow up:   Future Appointments   Date Time Provider Department Center   05/13/2023  2:00 PM [facility] GEN ONC BLOOD DRAW [facility] LAB   05/13/2023  4:00 PM [name], MD [facility] RADIATI   05/18/2023  1:30 PM [facility] CLINICAL PHARMACIST [facility] AM   07/06/2023  3:30 PM [name], MD [facility] AM     SUBJECTIVE   Nathan [name] is a 72 year old male here today to see pharmacist for follow-up. PMH T2DM, asthma, gout.    Interval history: Last seen by clinic pharmacist 03/19/23, metformin stopped d/t pt concerns of cough associated w/ meds, pt wanted to continue enalapril.    Presents today without medications. Confirms he stopped taking metformin since last visit, continuing on Trulicity 0.75 mg on Mondays. Denies GI upset. Checking BG couple times a week. BG was 120 in the evening this past week.     States cough has improved but still remains, thinks the sensation moved from bottom of throat to jaw area. Mouth feels oily, taste-buds have changed since being dx with diabetes. Doesn't think taste buds ever returned back to 100%. Use to drink a lot of juice w/ pulp but has since stopped. Also reports dry mouth, has clear mucus.  Unclear what may be causing card-board sensation in mouth. Doesn't think enalapril is causing cough. Also states it started after receiving 2 injections of  lupron?    Denies tobacco use, doesn't live w/ roommates/family who smoke. Has neighbors who smoke but doesn't find it triggers cough when windows are open.     Denies new medications. Not using albuterol or flovent inhalers.    Confirms he takes apixaban BID, has gout medication, and enalapril that he splits in half. Unclear if taking atorvastatin. Did not bring meds today.     Review of patient's allergies indicates:  No Known Allergies      OBJECTIVE     BP Readings from Last 3 Encounters:   04/20/23 115/76   03/19/23 117/85   03/12/23 108/71     Pulse Readings from Last 3 Encounters:   04/20/23 60   03/19/23 66   03/12/23 62     Wt Readings from Last 3 Encounters:   04/20/23 (!) 103 kg (227 lb)   03/19/23 (!) 103 kg (227 lb)   03/12/23 (!) 103.5 kg (228 lb 3.2 oz)       Recent Labs     03/12/23  1212 12/01/22  1518 08/21/22  1511   SODIUM 140 142 139   POTASSIUM 4.5 4.0 4.3   CL 108 106 106   CO2 23 23 21  L   IONGAP 9 13 H 12   GLUCOSE 104 98 120   BUN 31 H 21 37 H   CREATININE 1.74 H  1.41 H 1.85 H   GFR 42 L 54 L 39 L       Lab Results   Component Value Date    A1C 5.4 12/01/2022    A1C 6.0 06/09/2022    A1C 6.1 11/25/2021       The 10-year ASCVD risk score (Arnett DK, et al., 2019) is: 27%    Values used to calculate the score:      Age: 8 years      Sex: Male      Is Non-Hispanic African American: Yes      Diabetic: Yes      Tobacco smoker: No      Systolic Blood Pressure: 117 mmHg      Is BP treated: Yes      HDL Cholesterol: 46 mg/dL      Total Cholesterol: 146 mg/dL    Lab Results   Component Value Date    ALBCREATRATU 4 12/01/2022    ALBCREATRATU 7 11/25/2021    ALBCREATRATU 7 05/20/2021     Immunizations: declined all at last visit    Medications at End of Visit:  Current Outpatient Medications   Medication Sig Dispense Refill    Albuterol Sulfate HFA 108 (90 Base) MCG/ACT Inhalation Aero Soln Inhale 2 puffs by mouth every 6 hours as needed for shortness of breath/wheezing. 1 Inhaler 6    allopurinol  100 MG tablet Take 2 tablets (200 mg) by mouth daily. 180 tablet 0    apixaban (Eliquis) 2.5 MG tablet Take 1 tablet (2.5 mg) by mouth 2 times a day. 60 tablet 2    atorvastatin 40 MG tablet Take 1 tablet (40 mg) by mouth every evening. To lower cholesterol. 90 tablet 1    blood glucose test strip Use up to 1 strip daily to check blood sugar. 100 strip 1    dulaglutide (Trulicity) 0.75 MG/0.5ML pen-injector Inject 0.5 mL (0.75 mg) under the skin one time a week. 2 mL 3    enalapril 5 MG tablet Take 0.5 tablets (2.5 mg) by mouth daily. 45 tablet 2    Fluticasone Propionate HFA 110 MCG/ACT Inhalation Aerosol Inhale 1 puff by mouth every 12 hours. 1 Inhaler 6    glucometer (OneTouch Verio Flex System) w/Device kit Use to check blood sugar 4 times a day. 1 kit 6    glucose 4 g chewable tablet Chew and swallow 4 tablets (16 g) by mouth every 15 minutes as needed for low blood sugar. Recheck blood sugar as needed. 50 tablet 3    ketoconazole 2 % shampoo apply topically face and scalp if needed 120 mL 1    loratadine 10 MG tablet Take 1 tablet (10 mg) by mouth daily. 30 tablet 1     Current Facility-Administered Medications   Medication Dose Route Frequency Provider Last Rate Last Admin    pneumococcal 20-valent conjugate vaccine (Prevnar 20) injection 0.5 mL  0.5 mL Intramuscular Once [name], MD           I spent a total of 35 minutes for the patient's care on the date of the service.        Rogelia Boga, PharmD

## 2023-05-13 ENCOUNTER — Ambulatory Visit (HOSPITAL_BASED_OUTPATIENT_CLINIC_OR_DEPARTMENT_OTHER): Payer: Medicare HMO

## 2023-05-13 ENCOUNTER — Ambulatory Visit
Admission: RE | Admit: 2023-05-13 | Discharge: 2023-05-13 | Disposition: A | Discharge status: Home / Self Care | Payer: Medicare HMO

## 2023-05-13 ENCOUNTER — Encounter (HOSPITAL_BASED_OUTPATIENT_CLINIC_OR_DEPARTMENT_OTHER): Payer: Self-pay

## 2023-05-13 DIAGNOSIS — C61 Malignant neoplasm of prostate: Secondary | ICD-10-CM

## 2023-05-13 LAB — PSA, TOTAL & REFLEXIVE FREE: PSA, Diagnostic/Monitoring: <0.03 ng/mL (ref 0.00–4.00)

## 2023-05-13 NOTE — Progress Notes (Signed)
Radiation Oncology Follow Up Visit     Diagnosis:  prostate cancer  Stage: high-risk  Prior RT:  SBRT to the prostate, SV and pelvic lymph nodes completed on 06/30/22.   Providers: Patient Care Team:  [name], MD as PCP - General (Internal Medicine)  Cheri Kearns, MD as Managed Care (Internal Medicine)  Luisa Dago, MD as Supervising Physician (Internal Medicine)  Alycia Rossetti, Early Osmond, MD as Surgical Oncologist (Urology)  [name], MD as Radiation Oncologist (Radiation Oncology)  Nicholaus Corolla, RN as Registered Nurse (Nursing)  Ria Comment, MD as Oncologist (Hematology/Oncology)  PCP: Standley Brooking, MD     CC:     Nathan [name] is a 72 year old male with PMHx of Type 2 DM, HLD and newly diagnosed high risk prostate cancer (iT3a/T3b, Gleason score 4+3=7, 11/12 cores positive, iPSA 28.33, prostate volume 36.6cc).  PSMA PET/CT showed localized disease with possible invasion into the Left SV base.  He is s/p SBRT to the prostate, SV and pelvic lymph nodes completed on 06/30/22 with concurrent ADT.       Interval History:     He is doing well. His urinary flow is slightly slower compared to pre-treatment. There were two times that he had urge incontinence. He also notes some intermittency. He remains active. He has been doing a lot of fishing recently. He has regular BM and denies diarrhea and constipation.     He is tolerating lupron well and has minimal side effects.     Urinary function:       04/23/2022     4:05 PM 12/19/2022     3:00 PM   AUA BPH    How often have you had a sensation of not emptying your bladder completely after you finished urinating? 1  2   How often have you had to urinate again less than two hours after you finished urinating? 1  3   How often have you stopped and started again several times when you urinated? 0  0   How often have you found it difficult to postpone urination? 0  0   How often have you had a weak urinary stream? 0  0   How often have you had to push or  strain to begin urination? 0  0   How many times did you most typically get up to urinate from the time you went to bed at night until he time you got up in the morning? 2  2   If you were to spend the rest of your life with your urinary condition the way it is now, how would you feel about that? 1  0   Total Symptom Score:   1-7 Mild   8-19 Moderate   20-35 Severe 4 7       Patient-reported      IPSS: 7  QOL:1    Sexual function:      04/23/2022     4:05 PM   IIEF-5   When you had erections with sexual stimulation, HOW OFTEN were your erections hard enough for penetration? 1    During sexual intercourse, HOW OFTEN were you able to maintain your erection after you had penetrated (entered) your partner? 1    During sexual intercourse, HOW DIFFICULT was it to maintain your erection to completion of intercourse? 1    When you attempted sexual intercourse, HOW OFTEN was it satisfactory for you? 1  Patient-reported      Prior RT:  See above     ROS: Negative except those detailed above.       PE:    Weight: 105.6 kg (232 lb 11.2 oz)     General  Well-appearing well-nourished, appears stated age, in no acute distress.    HEENT  Normocephalic, atraumatic,anicteric. No eye/nose/ear discharge.     Neck  No gross lymphadenopathy. No goiter.    Lung  Normal respiratory effort, breathing comfortably on RA.    Abdomen   No apparent abdominal distention.    Extremities  No visible edema   Neuro  Moving all extremities spontaneously.     Psych  Attentive, conversant. Normal speech and affect.           Performance status: ECOG 0       Data Review:     Per HPI.       PSA, Diagnostic/Monitoring (ng/mL)   Date Value   03/12/2023 <0.03   12/01/2022 <0.03   08/21/2022 0.18   01/09/2022 28.33 (H)     PSA, Screening (ng/mL)   Date Value   11/25/2021 23.06 (H)   07/08/2021 28.19 (H)     Testosterone (ng/dL)   Date Value   09/81/1914 11 (L)            Assessment/Plan:    Nathan [name] is a 72 year old male with PMHx of Type 2 DM,  HLD and newly diagnosed high risk prostate cancer (iT3a/T3b, Gleason score 4+3=7, 11/12 cores positive, iPSA 28.33, prostate volume 36.6cc).  PSMA PET/CT showed localized disease with possible invasion into the Left SV base.  He is s/p SBRT to the prostate, SV and pelvic lymph nodes completed on 06/30/22 with concurrent ADT.      He is doing well with mild urinary symptoms. Bowel function is back to baseline. His PSA today has not resulted at the time of appointment. His PSA was undetectable last time. He is also tolerating lupron well.     Plan:  - RTC in 6 months with prior PSA and testosterone with Duncan Dull ARNP.   - He will continue lupron with Pietro Cassis ARNP.     Barnetta Hammersmith, MD, PhD  Radiation Oncology      40 minutes were spent on the patient's care, which included  5 minutes in pre-clinic review of clinical records and independent review of imaging studies;  25 minutes counseling with the patient face-to-face and review of logistics/potential side effects of radiotherapy;  and 5 minutes in post-clinic documentation of clinical information in the electronic health record and care coordination.

## 2023-05-15 LAB — TESTOSTERONE: Testosterone: <10 ng/dL — ABNORMAL LOW (ref 264–916)

## 2023-05-18 ENCOUNTER — Ambulatory Visit: Payer: Medicare HMO | Attending: Ambulatory Care | Admitting: Ambulatory Care

## 2023-05-18 ENCOUNTER — Other Ambulatory Visit (HOSPITAL_BASED_OUTPATIENT_CLINIC_OR_DEPARTMENT_OTHER): Payer: Self-pay

## 2023-05-18 VITALS — BP 105/69 | HR 66 | Temp 97.7°F | Resp 16 | Ht 66.0 in | Wt 225.0 lb

## 2023-05-18 DIAGNOSIS — Z79899 Other long term (current) drug therapy: Secondary | ICD-10-CM | POA: Insufficient documentation

## 2023-05-18 DIAGNOSIS — E1165 Type 2 diabetes mellitus with hyperglycemia: Secondary | ICD-10-CM | POA: Insufficient documentation

## 2023-05-18 DIAGNOSIS — Z794 Long term (current) use of insulin: Secondary | ICD-10-CM | POA: Insufficient documentation

## 2023-05-18 NOTE — Progress Notes (Signed)
Visit type: Appointed  Interpreter present: None  Referred to pharmacy 03/11/23 for DM and HTN     ASSESSMENT/PLAN     #T2DM: Last A1c at goal and home reading within postprandial goal < 180 mg/dL despite being OFF metformin since August.     --Continue Trulicity 0.75 mg/wk  --Due for repeat A1c, will pend order to be drawn w/ next [name] labs or at next PCP visit    #Persistent cough w/ mucus production: Unclear if it is from metformin or enalapril given it is not a DRY cough. Can consider switching to ARB in future but pt does not think it's from enalapril. No changes made today.     #Med Management: Meds reviewed w/ pt, adherent w/ medications. Can consider keeping one inhaler on hand in the future since pt does not seem to need two inhalers.     Follow up: PRN w/ pharmacy     SUBJECTIVE   Nathan [name] is a 72 year old male here today to see pharmacist for follow-up. PMH T2DM, asthma, gout.    Interval history: Last seen by clinic pharmacist 04/20/23, no changes made to medications.     Presents today with medications and glucometer. Still takes Trulicity 0.75 mg on Mondays. Denies GI upset.     AM meds after breakfast:  - Allopurinol 100 mg 2 tabs  - Eliquis 2.5 mg 1 tab  - Enalapril 5 mg 1/2 tab    PM med after dinner:  - Eliquis 2.5 mg 1 tab   - Atorvastatin 40 mg 1 tab     Home BG readings:  10/27 2:34pm 79  10/23 4:55pm 107  10/12 6:57pm 85  10/11 5:56pm 87  10/8 6:38pm 74  10/8 12:25am 89  10/2 3:32pm 99  9/27 8:23pm 123    States cough has improved since he last saw Dr. Renaldo Harrison. Continues to produce clear mucus w/ cough but doesn't think it's as bad anymore. Denies tobacco exposure, denies environmental exposures and other triggers.     Denies s/sx of hypoglycemia. Notes sweatiness likely from radiation treatment that he was previously getting at [facility].     Has two inhalers - cannot recall names. States one is at home and one is for emergencies. Has not needed either inhalers since January 2024.  Able to walk up hills without feeling short of breath. Only time he felt short of breath was when he had to run 1/2-1 block in [place], [place] but was able to self-relieve by resting.     Denies new medications. Using ketoconazole shampoo sparingly. Not taking loratadine for allergies but would like to keep it on the med list.     Expected the [facility] pharmacy team to help him get a CGM sensor again. States he received a letter that it was recalled, did not bring letter with him today. It has been discussed previously that he would not qualify for a CGM anymore w/o being on insulin. Pt at the end of visit states he prefers to check his BG via fingerstick 2x/wk for now. Doesn't think he'd really benefit from 2-week data from CGM.    Review of patient's allergies indicates:  No Known Allergies    OBJECTIVE     BP Readings from Last 3 Encounters:   05/18/23 105/69   04/20/23 115/76   03/19/23 117/85     Pulse Readings from Last 3 Encounters:   05/18/23 66   04/20/23 60   03/19/23 66  Wt Readings from Last 3 Encounters:   05/18/23 (!) 102.1 kg (225 lb)   05/13/23 (!) 105.6 kg (232 lb 11.2 oz)   04/20/23 (!) 103 kg (227 lb)       Recent Labs     03/12/23  1212 12/01/22  1518 08/21/22  1511   SODIUM 140 142 139   POTASSIUM 4.5 4.0 4.3   CL 108 106 106   CO2 23 23 21  L   IONGAP 9 13 H 12   GLUCOSE 104 98 120   BUN 31 H 21 37 H   CREATININE 1.74 H 1.41 H 1.85 H   GFR 42 L 54 L 39 L       Lab Results   Component Value Date    A1C 5.4 12/01/2022    A1C 6.0 06/09/2022    A1C 6.1 11/25/2021       The 10-year ASCVD risk score (Arnett DK, et al., 2019) is: 22.6%    Values used to calculate the score:      Age: 17 years      Sex: Male      Is Non-Hispanic African American: Yes      Diabetic: Yes      Tobacco smoker: No      Systolic Blood Pressure: 105 mmHg      Is BP treated: Yes      HDL Cholesterol: 46 mg/dL      Total Cholesterol: 146 mg/dL    Lab Results   Component Value Date    ALBCREATRATU 4 12/01/2022    ALBCREATRATU 7  11/25/2021    ALBCREATRATU 7 05/20/2021     Medications at End of Visit:  Current Outpatient Medications   Medication Sig Dispense Refill    Albuterol Sulfate HFA 108 (90 Base) MCG/ACT Inhalation Aero Soln Inhale 2 puffs by mouth every 6 hours as needed for shortness of breath/wheezing. 1 Inhaler 6    allopurinol 100 MG tablet Take 2 tablets (200 mg) by mouth daily. 180 tablet 0    apixaban (Eliquis) 2.5 MG tablet Take 1 tablet (2.5 mg) by mouth 2 times a day. 60 tablet 2    atorvastatin 40 MG tablet Take 1 tablet (40 mg) by mouth every evening. To lower cholesterol. 90 tablet 1    blood glucose test strip Use up to 1 strip daily to check blood sugar. 100 strip 1    dulaglutide (Trulicity) 0.75 MG/0.5ML pen-injector Inject 0.5 mL (0.75 mg) under the skin one time a week. 2 mL 3    enalapril 5 MG tablet Take 0.5 tablets (2.5 mg) by mouth daily. 45 tablet 2    Fluticasone Propionate HFA 110 MCG/ACT Inhalation Aerosol Inhale 1 puff by mouth every 12 hours. (Patient not taking: Reported on 05/18/2023) 1 Inhaler 6    glucometer (OneTouch Verio Flex System) w/Device kit Use to check blood sugar 4 times a day. 1 kit 6    glucose 4 g chewable tablet Chew and swallow 4 tablets (16 g) by mouth every 15 minutes as needed for low blood sugar. Recheck blood sugar as needed. 50 tablet 3    ketoconazole 2 % shampoo apply topically face and scalp if needed 120 mL 1    loratadine 10 MG tablet Take 1 tablet (10 mg) by mouth daily. 30 tablet 1     Current Facility-Administered Medications   Medication Dose Route Frequency Provider Last Rate Last Admin    pneumococcal 20-valent conjugate vaccine (Prevnar 20) injection 0.5  mL  0.5 mL Intramuscular Once [name], MD           I spent a total of 37 minutes for the patient's care on the date of the service.        Rogelia Boga, PharmD

## 2023-05-19 ENCOUNTER — Other Ambulatory Visit (HOSPITAL_BASED_OUTPATIENT_CLINIC_OR_DEPARTMENT_OTHER): Payer: Self-pay

## 2023-06-05 ENCOUNTER — Encounter (HOSPITAL_BASED_OUTPATIENT_CLINIC_OR_DEPARTMENT_OTHER): Payer: Self-pay | Admitting: Internal Medicine

## 2023-06-05 NOTE — Progress Notes (Signed)
 Faxed and uploaded signed [facility] Form

## 2023-06-15 ENCOUNTER — Other Ambulatory Visit (HOSPITAL_BASED_OUTPATIENT_CLINIC_OR_DEPARTMENT_OTHER): Payer: Self-pay | Admitting: Internal Medicine

## 2023-06-15 DIAGNOSIS — E1165 Type 2 diabetes mellitus with hyperglycemia: Secondary | ICD-10-CM

## 2023-06-17 ENCOUNTER — Other Ambulatory Visit (HOSPITAL_BASED_OUTPATIENT_CLINIC_OR_DEPARTMENT_OTHER): Payer: Self-pay

## 2023-06-17 MED ORDER — TRULICITY 0.75 MG/0.5ML SC SOAJ
0.7500 mg | SUBCUTANEOUS | 1 refills | Status: DC
Start: 2023-06-17 — End: 2023-08-10
  Filled 2023-06-17: qty 2, 28d supply, fill #0
  Filled 2023-07-14: qty 2, 28d supply, fill #1

## 2023-06-22 ENCOUNTER — Other Ambulatory Visit (HOSPITAL_BASED_OUTPATIENT_CLINIC_OR_DEPARTMENT_OTHER): Payer: Self-pay

## 2023-06-24 ENCOUNTER — Ambulatory Visit (HOSPITAL_BASED_OUTPATIENT_CLINIC_OR_DEPARTMENT_OTHER): Payer: Medicare HMO

## 2023-06-24 ENCOUNTER — Encounter (HOSPITAL_BASED_OUTPATIENT_CLINIC_OR_DEPARTMENT_OTHER): Payer: Self-pay

## 2023-06-24 ENCOUNTER — Ambulatory Visit: Payer: Medicare HMO

## 2023-06-24 VITALS — BP 146/80 | HR 66 | Temp 98.4°F | Resp 16

## 2023-06-24 VITALS — BP 146/80 | HR 66 | Temp 98.4°F | Resp 16 | Wt 236.6 lb

## 2023-06-24 DIAGNOSIS — C61 Malignant neoplasm of prostate: Secondary | ICD-10-CM

## 2023-06-24 DIAGNOSIS — Z5111 Encounter for antineoplastic chemotherapy: Secondary | ICD-10-CM | POA: Insufficient documentation

## 2023-06-24 LAB — COMPREHENSIVE METABOLIC PANEL
ALT (GPT): 21 U/L (ref 10–48)
AST (GOT): 20 U/L (ref 9–38)
Albumin: 4.1 g/dL (ref 3.5–5.2)
Alkaline Phosphatase (Total): 86 U/L (ref 36–161)
Anion Gap: 4 (ref 4–12)
Bilirubin (Total): 0.4 mg/dL (ref 0.2–1.3)
Calcium: 9.3 mg/dL (ref 8.9–10.2)
Carbon Dioxide, Total: 28 meq/L (ref 22–32)
Chloride: 111 meq/L — ABNORMAL HIGH (ref 98–108)
Creatinine: 1.5 mg/dL — ABNORMAL HIGH (ref 0.51–1.18)
Glucose: 95 mg/dL (ref 62–125)
Potassium: 4.2 meq/L (ref 3.6–5.2)
Protein (Total): 6.9 g/dL (ref 6.0–8.2)
Sodium: 143 meq/L (ref 135–145)
Urea Nitrogen: 21 mg/dL (ref 8–21)
eGFR by CKD-EPI 2021: 49 mL/min/{1.73_m2} — ABNORMAL LOW (ref >59)

## 2023-06-24 LAB — CBC (HEMOGRAM)
Hematocrit: 35 % — ABNORMAL LOW (ref 38.0–50.0)
Hemoglobin: 11.8 g/dL — ABNORMAL LOW (ref 13.0–18.0)
MCH: 30.2 pg (ref 27.3–33.6)
MCHC: 33.4 g/dL (ref 32.2–36.5)
MCV: 90 fL (ref 81–98)
Platelet Count: 188 10*3/uL (ref 150–400)
RBC: 3.91 10*6/uL — ABNORMAL LOW (ref 4.40–5.60)
RDW-CV: 13.2 % (ref 11.0–14.5)
WBC: 3.69 10*3/uL — ABNORMAL LOW (ref 4.3–10.0)

## 2023-06-24 LAB — PSA, DIAGNOSTIC/MONITORING: PSA, Diagnostic/Monitoring: 0.03 ng/mL (ref 0.00–4.00)

## 2023-06-24 MED ORDER — LEUPROLIDE ACETATE (3 MONTH) 22.5 MG IM KIT
22.5000 mg | PACK | Freq: Once | INTRAMUSCULAR | Status: AC
Start: 2023-06-24 — End: 2023-06-24
  Administered 2023-06-24: 22.5 mg via INTRAMUSCULAR
  Filled 2023-06-24: qty 1

## 2023-06-24 NOTE — Progress Notes (Addendum)
[facility]  GENITOURINARY ONCOLOGY CLINIC NOTE    ONCOLOGY CARE TEAM:  Patient Care Team:  Autumn Patty, MD as Surgical Oncologist (Urology)  [name], MD as Radiation Oncologist (Radiation Oncology)  Ria Comment, MD as Oncologist (Hematology/Oncology)    REFERRED BY: Dr. 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     CURRENT TREATMENT:  ADT since 05/26/2022  S/p SBRT to prostate, SV, and lymph nodes completed 06/30/2022    TREATMENT HISTORY:  Oncology History Overview   07/08/2021: PSA 28.19  11/25/2021: PSA 23.06  01/09/2022: PSA 28.33  01/24/2022: Prostate biopsy with gleason 4+3=7 prostate cancer in 11/12 cores, 30% pattern 4  02/14/2022: CT abdomen/pelvis shows sclerotic lesions in the pelvis perhaps concerning for prostate cancer  03/27/2023: PSMA PET shows tracer uptake in the PZ from apex to bilateral mid and L base extending to SV. No PSMA evident nodal or osseous met  05/26/2022: MRI Prostate with PIRADS 5 lesion, no macroscopic ECE, likely L SV invasion, no lympadenopathy or bone lesions. Starts ADT with lupron 22.5mg  q3 months  06/18/2022 -06/30/2022: SBRT to the prostate, SV, and pelvic lymph nodes + ADT, 4200cGy in 5 fractions   08/21/2022: PSA 0.18  12/01/2022: PSA undetectable         HPI:  Overall he reports he is doing fine. He tells me that his energy level is "where it should be", and appetite is well. He reports he is having hot flashes, these are occurring in greater frequency than initially when it was 1-2x/month, but it is still generally infrequent and he explains that he tolerates these fine/is not troublesome to  him. He denies any further episodes of blood in his urine, however, he does report ongoing episode weak stream; he notes that he continues to try to drink plenty of fluids. He notes that he has stopped taking his enalapril per PCP instruction on account of dry cough that he was experiencing, cough as since improved.     REVIEW OF SYSTEMS:  A complete review of systems was completed and negative unless documented above in HPI    HOME MEDICATIONS:    Current Outpatient Medications:     Albuterol Sulfate HFA 108 (90 Base) MCG/ACT Inhalation Aero Soln, Inhale 2 puffs by mouth every 6 hours as needed for shortness of breath/wheezing., Disp: 1 Inhaler, Rfl: 6    allopurinol 100 MG tablet, Take 2 tablets (200 mg) by mouth daily., Disp: 180 tablet, Rfl: 0    apixaban (Eliquis) 2.5 MG tablet, Take 1 tablet (2.5 mg) by mouth 2 times a day., Disp: 60 tablet, Rfl: 2    atorvastatin 40 MG tablet, Take 1 tablet (40 mg) by mouth every evening. To lower cholesterol., Disp: 90 tablet, Rfl: 1    blood glucose test strip, Use up to 1 strip daily to check blood sugar., Disp: 100 strip, Rfl: 1    dulaglutide (Trulicity) 0.75 MG/0.5ML auto-injector, Inject 0.75 mg under the skin one time a week., Disp: 2 mL, Rfl: 1    enalapril 5 MG tablet, Take 0.5 tablets (2.5 mg) by mouth daily., Disp: 45 tablet,  Rfl: 2    Fluticasone Propionate HFA 110 MCG/ACT Inhalation Aerosol, Inhale 1 puff by mouth every 12 hours. (Patient not taking: Reported on 05/18/2023), Disp: 1 Inhaler, Rfl: 6    glucometer (OneTouch Verio Flex System) w/Device kit, Use to check blood sugar 4 times a day., Disp: 1 kit, Rfl: 6    glucose 4 g chewable tablet, Chew and swallow 4 tablets (16 g) by mouth every 15 minutes as needed for low blood sugar. 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   CHEST: Normal work of breathing  NEURO: Appropriate gait. Moving all extremities spontaneously   MSK: Normal bulk and tone in all extremities  SKIN: No apparent rashes or lesions    ECOG: 1    LABORATORY STUDIES:  CBC  Recent Labs     06/24/23  1508   WBC 3.69 L   RBC 3.91 L   HEMOGLOBIN 11.8 L   HEMATOCRIT 35 L   MCV 90   PLATELET 188     CMP  Recent Labs     06/24/23  1508   SODIUM 143   POTASSIUM 4.2   CL 111 H   CO2 28   IONGAP 4   GLUCOSE 95   BUN 21   CREATININE 1.50 H   GFR 49 L   CA 9.3   AST 20   ALT 21   ALK 86   BILIRUBN 0.4   PROTEIN 6.9   ALBUMIN 4.1     PSA  Recent Labs     06/24/23  1508 05/13/23  1525 03/12/23  1212 12/01/22  1518 08/21/22  1511 01/09/22  1050 11/25/21  1401 07/08/21  1422   PSA 0.03 <0.03 <0.03 <0.03 0.18 28.33 H 23.06 H 28.19 H     ASSESSMENT:  Nathan [name] is a 72 year old male with new diagnosis of 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 PSA was pending at the time of our visit, then later finalized as 0.03, this had been undetected since 12/01/22, CBC and CMP with Cr 1.50 stable for recent baseline as seen in our lab draw; WBC  3.69, H/H 11.8/35; we'll continue to monitor all these, likely c/w radiation course. Overall he reports he  is tolerating course stably with minimal hot flashes and is stable energy.    Recall he was diagnosed about 1 year ago with gg3 disease with extracapsular extension and pT3a disease, possibly pT3b disease with some MRI evidence of extension into the L SV. Is now s/p definitive radiation to the prostate, SV, and pelvic lymph nodes, in combination with ADT. We reviewed the treatment plan, which will be to continue with ADT x2 years, to end in 05/2024. Notably, he does not meet STAMPEDE criteria to add abi, as he does not have lymph node positive disease, and only meets 1/3 criteria (T3b disease, PSA >40, or gg4+ disease.)    Given his current treatment context, the 0.03 reading today may be machine error, and we could either recheck this next week, or simply recheck next planned visit in 3 months. Certainly if it were to rise we would need to address accordingly as potential recurrence/early CRPC, however, if this simply resolves to undetected, then we would be reassured. I had planned to message him in MyChart about this, but it does not appear that he has this set up, so I will plan to give him a call early next week to let him know.  -Next lupron 22.5mg  due in 12 weeks, follow up then or sooner if needed.    Genetics   Meets criteria for germline genetic testing given high risk localized disease, will discuss at later date.     FOLLOW UP:  Next in 3 months at next lupron dose, sooner if needed.

## 2023-06-24 NOTE — Progress Notes (Signed)
Oncology Infusion Nurse Note    Subjective / Visit Information:    Reason for Visit: Immunization or Injection (lupron)      Objective:     Vitals:    06/24/23 1515   Temp: 36.9 C   Pulse: 66   BP: (!) 146/80   Resp: 16   SpO2: 98%       Medication Administrations This Visit         leuprolide (3 month) (Lupron Dep[place]) injection 22.5 mg Admin Date  06/24/2023  15:24 Action  Given Dose  22.5 mg Route  Intramuscular Site  Right Ventrogluteal Documented By  Sharee Pimple, RN    Ordering Provider: Carin Primrose    [facility]: [phone]            Assessment and Plan:            Hand off: No    Discharge:   Patient tolerated treatment well and without complications.  Discharge disposition:   Accompanied by: Self  Discharged to: Clinic  Response: Aware of existing appointments, Aware of clinic contact information, States understanding of plan, Restates plan, and No further questions    Summary: Pt presented to clinic unaccompanied, stable ambulatory.  VSS, RA.  Lupron IM injection tolerated well and without complications.  Pt stable ambulatory upon discharge to clinic hallway, awaiting provider visit.      Additional documentation may exist in Flowsheets or Education Activity

## 2023-07-06 ENCOUNTER — Ambulatory Visit (HOSPITAL_BASED_OUTPATIENT_CLINIC_OR_DEPARTMENT_OTHER): Payer: Medicare HMO | Admitting: Internal Medicine

## 2023-07-06 NOTE — Progress Notes (Unsigned)
 PATIENT DID NOT ARRIVE FOR CLINIC VISIT ON 07/06/23.

## 2023-07-12 NOTE — Progress Notes (Signed)
 Adult Medicine Clinic - Outpatient Return Clinic Note    Date:  07/14/2023    INTERVAL HISTORY/HPI:    [name] is a 72 year old man hospitalized 4/14 - 11/05/20 for syncope, PE, NSTEMI, Hyoxic resp failure, HHS, Newly diagnosed DM, Asthma, AKI/CKD, and gout.     Nathan [name] returns to adult medicine clinic for ongoing follow-up.  He states that overall things are going well.  He reports an intermittent cough that he says has some phlegm.  He is not currently experiencing this.    Today he presented me with a document describing his new pharmacy will be [facility] [address] as his former pharmacy [facility] appears to be closing.  We reviewed his medications and these were resubmitted to his new pharmacy.     His diabetic care was reviewed noting that he is due for repeat hemoglobin A1c.     He confirmed that he is taking Eliquis.     He is receiving treatment with Lupron (Leuprolide) negative feedback LHRH agonist, 22.5 mg q 3 months.       Allergies: Grass and cherry blossoms. He reports they have been kicking in late November. Not allergic to ASA       SH:  He has been staying at home.  He lives at home alone, likes to fish in the warmer months.  He states he believes in [place], and lives on a fixed income.  He likes to "speak when spoken to".       PROBLEM LIST: See Epic Problem List    Patient Active Problem List   Diagnosis    Obesity, unspecified    Generalized osteoarthrosis, unspecified site    Other hyperlipidemia    Presbyopia    Gout    Irregular heart beat    Achilles tendinosis of right lower extremity    Mild intermittent asthma without complication (HCC)    Healthcare maintenance    Non-seasonal allergic rhinitis    Subacute massive pulmonary embolism (HCC)    Type 2 diabetes mellitus with hyperglycemia, with long-term current use of insulin (HCC)    Syncope    PSA elevation    Prostate cancer (HCC)    Cardiac risk counselling       MEDICATIONS:    Current Outpatient Medications:     Albuterol Sulfate  HFA 108 (90 Base) MCG/ACT Inhalation Aero Soln, Inhale 2 puffs by mouth every 6 hours as needed for shortness of breath/wheezing., Disp: 1 Inhaler, Rfl: 6    allopurinol 100 MG tablet, Take 2 tablets (200 mg) by mouth daily., Disp: 180 tablet, Rfl: 0    apixaban (Eliquis) 2.5 MG tablet, Take 1 tablet (2.5 mg) by mouth 2 times a day., Disp: 60 tablet, Rfl: 2    atorvastatin 40 MG tablet, Take 1 tablet (40 mg) by mouth every evening. To lower cholesterol., Disp: 90 tablet, Rfl: 1    blood glucose test strip, Use up to 1 strip daily to check blood sugar., Disp: 100 strip, Rfl: 1    dulaglutide (Trulicity) 0.75 MG/0.5ML auto-injector, Inject 0.75 mg under the skin one time a week., Disp: 2 mL, Rfl: 1    enalapril 5 MG tablet, Take 0.5 tablets (2.5 mg) by mouth daily., Disp: 45 tablet, Rfl: 2    Fluticasone Propionate HFA 110 MCG/ACT Inhalation Aerosol, Inhale 1 puff by mouth every 12 hours. (Patient not taking: Reported on 05/18/2023), Disp: 1 Inhaler, Rfl: 6    glucometer (OneTouch Verio Flex System) w/Device kit, Use  to check blood sugar 4 times a day., Disp: 1 kit, Rfl: 6    glucose 4 g chewable tablet, Chew and swallow 4 tablets (16 g) by mouth every 15 minutes as needed for low blood sugar. Recheck blood sugar as needed., Disp: 50 tablet, Rfl: 3    ketoconazole 2 % shampoo, apply topically face and scalp if needed, Disp: 120 mL, Rfl: 1    loratadine 10 MG tablet, Take 1 tablet (10 mg) by mouth daily., Disp: 30 tablet, Rfl: 1    Current Facility-Administered Medications:     pneumococcal 20-valent conjugate vaccine (Prevnar 20) injection 0.5 mL, 0.5 mL, Intramuscular, Once, [name], MD       ALLERGIES:  Review of patient's allergies indicates:  No Known Allergies    PHYSICAL EXAM:    BP 118/81   Pulse 67   Temp 36.4 C (Temporal)   Wt (!) 108.4 kg (239 lb)   SpO2 96%   BMI 38.58 kg/m     LUNGS:  Clear to auscultation, no wheezing  COR:  RRR w/o murmur  ABD:  Soft, non-tender, no bruits  EXT:  No  Edema  Skin: Warm and Dry, no rash        LAB AND IMAGING RESULTS:  Lab on 06/24/23   1. CBC   Result Value Ref Range    WBC 3.69 (L) 4.3 - 10.0 10*3/uL    RBC 3.91 (L) 4.40 - 5.60 10*6/uL    Hemoglobin 11.8 (L) 13.0 - 18.0 g/dL    Hematocrit 35 (L) 82.9 - 50.0 %    MCV 90 81 - 98 fL    MCH 30.2 27.3 - 33.6 pg    MCHC 33.4 32.2 - 36.5 g/dL    Platelet Count 562 130 - 400 10*3/uL    RDW-CV 13.2 11.0 - 14.5 %   2. Comprehensive Metabolic Panel   Result Value Ref Range    Sodium 143 135 - 145 meq/L    Potassium 4.2 3.6 - 5.2 meq/L    Chloride 111 (H) 98 - 108 meq/L    Carbon Dioxide, Total 28 22 - 32 meq/L    Anion Gap 4 4 - 12    Glucose 95 62 - 125 mg/dL    Urea Nitrogen 21 8 - 21 mg/dL    Creatinine 8.65 (H) 0.51 - 1.18 mg/dL    Protein (Total) 6.9 6.0 - 8.2 g/dL    Albumin 4.1 3.5 - 5.2 g/dL    Bilirubin (Total) 0.4 0.2 - 1.3 mg/dL    Calcium 9.3 8.9 - 78.4 mg/dL    AST (GOT) 20 9 - 38 U/L    Alkaline Phosphatase (Total) 86 36 - 161 U/L    ALT (GPT) 21 10 - 48 U/L    eGFR by CKD-EPI 2021 49 (L) >59 mL/min/1.73_m2   3. Prostate Specific Antigen   Result Value Ref Range    PSA, Diagnostic/Monitoring 0.03 0.00 - 4.00 ng/mL       ASSESSMENT AND PLAN:    Diabetes:  Dulaglutide = Trulicity 0.75 mg sc q wk and metformin 500 mg bid.      Foot Exam - 11/26/20, 07/04/21, 11/25/21, 09/01/22, completed.   Ophthal Exam:  11/21/22:  DM2 without retinopathy - recommend annual DFE.  Mixed type cataracts, both eyes, mild. Monitor.  F/U 1 - 2 years.    Alb/Creat = 7 (11/25/21)        A1C [facility] AMB     A1C  Latest Ref Rng 4.0 - 6.0 %   05/20/2021 6.1 (H)    11/25/2021 6.1 (H)    06/09/2022 6.0    12/01/2022 5.4        2. 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  He is currently taking 2.5 mg daily and the script is written for 2.5 mg bid.       3. Asthma.  He uses -Fluticasone 110 mcg BID  -Albuterol Q6hr PRN and has a stable pattern. PFTs  (08/29/22):  FEV1/FVC 88%, FEVI 64% predicted.  Interpretation:  Narrative & Impression: No obstructive disorder. Spirometry suggests a possible mild restrictive disorder.        4. Cardiac Risk Factors.  BP 124/62 off meds, BMI 47, LDL 96, DM no, No FH CAD, No tobacco. No MI or Stroke.  EKG (11/01/21): NSR, Axis - 52 (LAD), PR N, QRS 168, QTc 459, RBBB, LAHB, No Q-waves. ASCVD Risk 9.8%.  Atorvastatin 40 mg qd. Repeat labs.         Cholesterol     LDL Cholesterol   Latest Ref Rng <130 mg/dL   1/61/0960 454    09/81/1914 62    06/09/2022 74       5. HTN. Enalapril 2.5 mg daily begun 06/2022.  Self report of dry cough.  He appears to be tolerating well.        Vitals     Systolic Diastolic   05/18/2023 105  69    06/24/2023 146 !  80 !     146 !  80 !    07/14/2023 93 !  58 !     118  81       6. Cardiac Function     Cards note 11/01/20: MICU for hypotension and respiratory failure found to have troponin elevation of 0.17-->1.6 found to have new severe RV dilation and systolic dysfunction found to have large PE. Also considered primary ACS however patient is not in any chest pain, likely location the right sided or posterior however EKG is not presently suggestive of ACS in such anatomic distribution. Patient does have risk factors for ACS including diabetes, obesity, and tobacco history. Given that he has large PE with RV dysfunction I suspect his troponin is secondary to PE.     11/01/20: ECHO: Conclusion  1. The left ventricle is normal in size. There is normal left ventricular  wall thickness. Global left ventricular function is normal (biplane EF 68%).  No regional wall motion abnormalities are present.  2. The right ventricle is severely dilated. The right ventricular systolic  function is severely decreased. Right ventricular segmental wall motion  abnormalities are present with decreased function of the mid segment. The  right atrium is mildly dilated.  3. Normal valve structure and function.  4. The pulmonary  artery systolic pressure is borderline elevated at 32 mmHg.  5, There is no pericardial effusion.     08/06/21: ECHO:  Conclusion: Normal LV size with mildly reduced systolic function. Regional wall motion abnormalities shown below.  Normal RV size and systolic function. Normal RA pressure. Could not evaluate PA pressure. RV/LV ratio 0.7 which is normal. Normal valve structure and function.  Compared with prior study from 4/14//22, RV size and systolic function has improved.      6. CKD (Grade 3a).  Stable pattern over time.   Repeat labs.    Creatinine 1.60 - 1.45 mg/dL  GFR 44 - 56 ml/min (as of 06/09/22)  Prot / Creat < 0.1 (02/22/16)   Alb/Creat 7 (05/20/21)       Renal Function Testing     Creatinine   Latest Ref Rng 0.51 - 1.18 mg/dL   1/61/0960 1.6 (H)    05/26/2022 1.5 !    06/09/2022 1.57 (H)    08/21/2022 1.85 (H)    12/01/2022 1.41 (H)       GFR 54 (12/01/22)  Stage 3a.       7.  Prostate Cancer:  No FH of prostate CA.   He elects to obtain a PSA after SDM.  (07/04/21)  PSA 28.19 (referral sent on 07/10/21, patients questions were addressed but closed.)  11/25/21:  Again SDM today with patient agreement to proceed with referral to Urology.  He is undergoing therapy for prostate cancer (XRT and Lupron).       PSA [facility] AMB     Prostate Specific Antigen   Latest Ref Rng 0.00 - 4.00 ng/mL   07/08/2021 28.19 (H)    11/25/2021 23.06 (H)    01/09/2022 28.33 (H)    08/21/2022 0.18    12/01/2022 <0.03        8. Chronic gout.  He report intermittent foot pain but is non-specific on interview.  Records indicate last flare podagra (L great toe) in June 2017.  He is minimizing red meat, and no alcohol.  He has CKD therefore goal is to use tylenol over NSAIDs for pain.  He is on Allopurinol 200 mg daily, Colchicine 0.6 mg 2 tabs for flare, then 1 tab 1 hr later.  Uric Acid 7.4 (07/07/18).  CrCl at 50 mL/min, allopurinol and colchicine doses acceptable.  ASA has been stopped due to gout and CKD.  (stable 03/09/23)     9.  Social:  He  has no DPOA.  But his brother Loraine Leriche who lives in [place] calls and checks on him the most.  He would want Loraine Leriche involved if her were unable to make decisions for himself.         HEALTH CARE MAINTANENCE / SCREENING:  (See below)      PHQ 2 Screening:         PHQ-9     PHQ2 Total Score   07/11/2019 0    04/23/2022 0    06/30/2022 0       Colorectal Cancer Screening:  FIT negative (07/23/18 and 07/10/21). Patient declines colonoscopy.  Repeat FIT today.  (07/14/23)      Latest Reference Range & Units 07/23/18 22:18 07/26/19 01:00 07/10/21 23:55 10/08/22 01:46   Occult Bld 1 Result NRN  Negative Negative Negative Negative     Tobacco Use:  Quit at age 19.  ETOH Use:  Occ use   Marijuana:  Occ      Hepatitis C / HIV Screening:  HIV negative 2013.  Hep C (1945-65 + high risk): Neg 2013     AAA screening.  He smoked at a young age therefore he is eligible.        Pneumococcal:   IF Age > 65.          1) If PPSV23 Only, PCV20 > 1 yr;  2)  If Both PPSV23/PCV13, PCV20 after 5 yrs after last pnx (SDM if PPSV23 last given was > 65); 3) If NO prior pnx - Give PCV20: Due for PCV20. - declines   07/14/23  Shingrix (> Age 55, +/- Zostavax or shingles) -  2 doses:  Will discuss with patient. Declines 07/14/23   Tdap, then Td every 10 yrs: Tdap 2012, due for Td.  Declines 07/14/23.    Influenza: declines 07/14/23  COVID: declines 07/14/23    Immunization History   Administered Date(s) Administered    COVID-19 Moderna mRNA monovalent 12 yrs and older 09/20/2019, 10/18/2019    COVID-19 Pfizer mRNA bivalent 12 yrs and older 07/04/2021    COVID-19 Pfizer mRNA monovalent tris-sucrose 12 yrs and older 11/26/2020    Hepatitis B, unspecified 10/05/2007, 11/05/2007, 05/08/2008    Influenza quadrivalent PF 04/19/2013, 06/12/2014, 05/01/2015    Influenza quadrivalent adjuvanted (Fluad) 05/19/2019    Influenza trivalent PF 07/07/2012    Influenza, unspecified 09/26/2010    Pneumococcal polysaccharide PPSV23 (Pneumovax 23) 02/12/2009    Tdap 01/21/2011    Pended Date(s) Pended    Pneumococcal conjugate PCV20 (Prevnar 20) 03/11/2023       Future Appointments   Date Time Provider Department Center   07/14/2023  2:30 PM [name], MD [facility] AM   09/11/2023  1:00 PM [facility] GU VISIT [id number]   09/11/2023  1:45 PM [facility] GU VISIT [id number]   09/11/2023  2:15 PM Ria Comment, MD [facility]     F/U Dr. Renaldo Harrison in 4 months.    Total time spent in the direct care of this patient and in reviewing records and documentation on the date of service was 30 minutes.

## 2023-07-14 ENCOUNTER — Ambulatory Visit: Payer: Medicare HMO | Admitting: Internal Medicine

## 2023-07-14 ENCOUNTER — Other Ambulatory Visit (HOSPITAL_BASED_OUTPATIENT_CLINIC_OR_DEPARTMENT_OTHER): Payer: Self-pay

## 2023-07-14 ENCOUNTER — Other Ambulatory Visit (HOSPITAL_BASED_OUTPATIENT_CLINIC_OR_DEPARTMENT_OTHER): Payer: Self-pay | Admitting: Internal Medicine

## 2023-07-14 ENCOUNTER — Other Ambulatory Visit (HOSPITAL_COMMUNITY): Payer: Self-pay

## 2023-07-14 VITALS — BP 118/81 | HR 67 | Temp 97.5°F | Wt 239.0 lb

## 2023-07-14 DIAGNOSIS — E785 Hyperlipidemia, unspecified: Secondary | ICD-10-CM

## 2023-07-14 DIAGNOSIS — C61 Malignant neoplasm of prostate: Secondary | ICD-10-CM | POA: Insufficient documentation

## 2023-07-14 DIAGNOSIS — I10 Essential (primary) hypertension: Secondary | ICD-10-CM | POA: Insufficient documentation

## 2023-07-14 DIAGNOSIS — E1165 Type 2 diabetes mellitus with hyperglycemia: Secondary | ICD-10-CM

## 2023-07-14 DIAGNOSIS — I2699 Other pulmonary embolism without acute cor pulmonale: Secondary | ICD-10-CM | POA: Insufficient documentation

## 2023-07-14 DIAGNOSIS — J3089 Other allergic rhinitis: Secondary | ICD-10-CM

## 2023-07-14 DIAGNOSIS — D649 Anemia, unspecified: Secondary | ICD-10-CM

## 2023-07-14 DIAGNOSIS — M1A3790 Chronic gout due to renal impairment, unspecified ankle and foot, without tophus (tophi): Secondary | ICD-10-CM | POA: Insufficient documentation

## 2023-07-14 DIAGNOSIS — J45909 Unspecified asthma, uncomplicated: Secondary | ICD-10-CM | POA: Insufficient documentation

## 2023-07-14 DIAGNOSIS — Z794 Long term (current) use of insulin: Secondary | ICD-10-CM | POA: Insufficient documentation

## 2023-07-14 DIAGNOSIS — Z0000 Encounter for general adult medical examination without abnormal findings: Secondary | ICD-10-CM | POA: Insufficient documentation

## 2023-07-14 DIAGNOSIS — N1831 Chronic kidney disease, stage 3a: Secondary | ICD-10-CM | POA: Insufficient documentation

## 2023-07-14 DIAGNOSIS — I129 Hypertensive chronic kidney disease with stage 1 through stage 4 chronic kidney disease, or unspecified chronic kidney disease: Secondary | ICD-10-CM | POA: Insufficient documentation

## 2023-07-14 DIAGNOSIS — R7303 Prediabetes: Secondary | ICD-10-CM

## 2023-07-14 LAB — FERRITIN: Ferritin: 287 ng/mL — ABNORMAL HIGH (ref 20–230)

## 2023-07-14 LAB — LIPID PANEL
Cholesterol/HDL Ratio: 2.7
HDL Cholesterol: 59 mg/dL (ref >39)
LDL Cholesterol, NIH Equation: 88 mg/dL (ref <130)
Non-HDL Cholesterol: 102 mg/dL (ref 0–159)
Total Cholesterol: 161 mg/dL (ref <200)
Triglyceride: 72 mg/dL (ref <150)

## 2023-07-14 LAB — IRON BINDING CAPACITY (W/IRON, TRANSFERRIN & TRANSF SAT)
Iron, SRM: 90 ug/dL (ref 31–171)
Total Iron Binding Capacity: 351 ug/dL (ref 250–460)
Transferrin Saturation: 26 % (ref 15–50)
Transferrin: 251 mg/dL (ref 180–329)

## 2023-07-14 LAB — FOLATE & VITAMIN B12
Folate, SRM: 11.2 ng/mL (ref >5.8)
Vitamin B12 (Cobalamin): 276 pg/mL (ref 180–914)

## 2023-07-14 LAB — PSA, TOTAL & REFLEXIVE FREE: PSA, Diagnostic/Monitoring: 0.03 ng/mL (ref 0.00–4.00)

## 2023-07-14 MED ORDER — LORATADINE 10 MG OR TABS
10.0000 mg | ORAL_TABLET | Freq: Every day | ORAL | 3 refills | Status: DC
Start: 2023-07-14 — End: 2023-09-30

## 2023-07-14 MED ORDER — ENALAPRIL MALEATE 5 MG OR TABS: 2.5000 mg | ORAL_TABLET | Freq: Every day | ORAL | 3 refills | Status: DC

## 2023-07-14 MED ORDER — ALBUTEROL SULFATE HFA 108 (90 BASE) MCG/ACT IN AERS: 2.0000 | INHALATION_SPRAY | Freq: Four times a day (QID) | RESPIRATORY_TRACT | 3 refills | Status: DC | PRN

## 2023-07-14 MED ORDER — ALLOPURINOL 100 MG OR TABS: 200.0000 mg | ORAL_TABLET | Freq: Every day | ORAL | 3 refills | Status: DC

## 2023-07-14 MED ORDER — ATORVASTATIN CALCIUM 40 MG OR TABS: ORAL_TABLET | ORAL | 3 refills | Status: DC

## 2023-07-14 MED ORDER — FLUTICASONE PROPIONATE HFA 110 MCG/ACT IN AERO: 1.0000 | INHALATION_SPRAY | Freq: Two times a day (BID) | RESPIRATORY_TRACT | 3 refills | Status: DC

## 2023-07-14 MED ORDER — APIXABAN 2.5 MG OR TABS
2.5000 mg | ORAL_TABLET | Freq: Two times a day (BID) | ORAL | 3 refills | Status: DC
Start: 2023-07-14 — End: 2023-11-13

## 2023-07-14 NOTE — Patient Instructions (Addendum)
 Today re-sent medications to [facility].     Please obtain blood work for your diabetes.     You will receive a FIT test. Please send it in.

## 2023-07-16 LAB — HEMOGLOBIN A1C, HPLC: Hemoglobin A1C: 5.7 % — ABNORMAL HIGH (ref 4.0–5.6)

## 2023-07-18 LAB — TESTOSTERONE: Testosterone: <10 ng/dL — ABNORMAL LOW (ref 264–916)

## 2023-07-20 ENCOUNTER — Other Ambulatory Visit (HOSPITAL_BASED_OUTPATIENT_CLINIC_OR_DEPARTMENT_OTHER): Payer: Self-pay

## 2023-08-04 ENCOUNTER — Other Ambulatory Visit (HOSPITAL_BASED_OUTPATIENT_CLINIC_OR_DEPARTMENT_OTHER): Payer: Self-pay | Admitting: Internal Medicine

## 2023-08-04 ENCOUNTER — Ambulatory Visit: Payer: Medicare HMO | Attending: Internal Medicine

## 2023-08-04 DIAGNOSIS — Z0000 Encounter for general adult medical examination without abnormal findings: Secondary | ICD-10-CM

## 2023-08-05 LAB — OCCULT BLOOD BY IA, STL: Occult Bld 1 Result: NEGATIVE

## 2023-08-10 ENCOUNTER — Other Ambulatory Visit (HOSPITAL_BASED_OUTPATIENT_CLINIC_OR_DEPARTMENT_OTHER): Payer: Self-pay | Admitting: Internal Medicine

## 2023-08-10 ENCOUNTER — Encounter (HOSPITAL_BASED_OUTPATIENT_CLINIC_OR_DEPARTMENT_OTHER): Payer: Self-pay | Admitting: Internal Medicine

## 2023-08-10 DIAGNOSIS — E1165 Type 2 diabetes mellitus with hyperglycemia: Secondary | ICD-10-CM

## 2023-08-10 NOTE — Progress Notes (Signed)
Labs reviewed.  Will continue to monitor renal function.        Orders Only on 08/04/23   1. Fecal Immunochemical Test (Colorectal Cancer Screen)   Result Value Ref Range    Occult Bld 1 Result Negative NRN       07/14/23  A1c - 5.7%  LDL 88       Renal Function Testing    Creatinine   Latest Ref Rng 0.51 - 1.18 mg/dL   07/26/1094 0.45 (H)    12/01/2022 1.41 (H)    03/12/2023 1.74 (H)    06/24/2023 1.50 (H)          Latest Reference Range & Units 07/14/23 15:40   Ferritin 20 - 230 ng/mL 287 (H)   Folate, SRM >5.8 ng/mL 11.2   Iron, SRM 31 - 171 ug/dL 90   Total Iron Binding Capacity 250 - 460 ug/dL 409   Transferrin 811 - 329 mg/dL 914   Transferrin Saturation 15 - 50 % 26   Vitamin B12 (Cobalamin) 180 - 914 pg/mL 276

## 2023-08-13 ENCOUNTER — Other Ambulatory Visit (HOSPITAL_BASED_OUTPATIENT_CLINIC_OR_DEPARTMENT_OTHER): Payer: Self-pay

## 2023-08-13 MED ORDER — TRULICITY 0.75 MG/0.5ML SC SOAJ
0.7500 mg | SUBCUTANEOUS | 2 refills | Status: DC
Start: 2023-08-13 — End: 2023-10-26
  Filled 2023-08-13: qty 2, 28d supply, fill #0
  Filled 2023-09-10: qty 2, 28d supply, fill #1
  Filled 2023-10-07 (×2): qty 2, 28d supply, fill #2

## 2023-09-08 ENCOUNTER — Encounter (HOSPITAL_BASED_OUTPATIENT_CLINIC_OR_DEPARTMENT_OTHER): Payer: Self-pay

## 2023-09-10 ENCOUNTER — Other Ambulatory Visit (HOSPITAL_BASED_OUTPATIENT_CLINIC_OR_DEPARTMENT_OTHER): Payer: Self-pay

## 2023-09-11 ENCOUNTER — Ambulatory Visit (HOSPITAL_BASED_OUTPATIENT_CLINIC_OR_DEPARTMENT_OTHER): Payer: Medicare HMO

## 2023-09-11 ENCOUNTER — Ambulatory Visit (HOSPITAL_BASED_OUTPATIENT_CLINIC_OR_DEPARTMENT_OTHER): Payer: Medicare HMO | Admitting: Student in an Organized Health Care Education/Training Program

## 2023-09-11 ENCOUNTER — Ambulatory Visit: Payer: Medicare HMO

## 2023-09-11 DIAGNOSIS — C61 Malignant neoplasm of prostate: Secondary | ICD-10-CM

## 2023-09-11 DIAGNOSIS — Z5111 Encounter for antineoplastic chemotherapy: Secondary | ICD-10-CM | POA: Insufficient documentation

## 2023-09-11 LAB — PSA, DIAGNOSTIC/MONITORING: PSA, Diagnostic/Monitoring: 0.03 ng/mL (ref 0.00–4.00)

## 2023-09-11 MED ORDER — LEUPROLIDE ACETATE (3 MONTH) 22.5 MG IM KIT
22.5000 mg | PACK | Freq: Once | INTRAMUSCULAR | Status: AC
Start: 2023-09-11 — End: 2023-09-11
  Administered 2023-09-11: 22.5 mg via INTRAMUSCULAR
  Filled 2023-09-11: qty 1

## 2023-09-11 NOTE — Progress Notes (Unsigned)
 [facility]  GENITOURINARY ONCOLOGY CLINIC NOTE    ONCOLOGY CARE TEAM:  Patient Care Team:  Autumn Patty, MD as Surgical Oncologist (Urology)  [name], MD as Radiation Oncologist (Radiation Oncology)  Ria Comment, MD as Oncologist (Hematology/Oncology)    REFERRED BY: Dr. [name]     IDENTIFICATION/CC:  Nathan [name] is a 73 year old male with new diagnosis of high risk localized (gleason 4+3=7, gg3, at least T3a, PSA 28.3) s/p SBRT to the prostate, SV, and pelvic lymph nodes completed on 06/30/2022 in combination with ADT, here to establish care for long course ADT.     CURRENT TREATMENT:  ADT since 05/26/2022  S/p SBRT to prostate, SV, and lymph nodes completed 06/30/2022    TREATMENT HISTORY:  Oncology History Overview   07/08/2021: PSA 28.19  11/25/2021: PSA 23.06  01/09/2022: PSA 28.33  01/24/2022: Prostate biopsy with gleason 4+3=7 prostate cancer in 11/12 cores, 30% pattern 4  02/14/2022: CT abdomen/pelvis shows sclerotic lesions in the pelvis perhaps concerning for prostate cancer  03/27/2023: PSMA PET shows tracer uptake in the PZ from apex to bilateral mid and L base extending to SV. No PSMA evident nodal or osseous met  05/26/2022: MRI Prostate with PIRADS 5 lesion, no macroscopic ECE, likely L SV invasion, no lympadenopathy or bone lesions. Starts ADT with lupron 22.5mg  q3 months  06/18/2022 -06/30/2022: SBRT to the prostate, SV, and pelvic lymph nodes + ADT, 4200cGy in 5 fractions   08/21/2022: PSA 0.18  12/01/2022: PSA undetectable         HPI:  Nathan [name] presents to establish care in medical oncology to take over administration of long course ADT for high risk localized prostate cancer. He has in general been feeling well on the treatment. He has taken this opportunity to change his lifestyle in regards to diet and exercise, and has eliminated processed foods and junk foods from his diet for the most part. Is eating lots more vegetables and whole foods, which is helping  with diabetes control and weight loss. Feels very well from a prostate cancer perspective, and is looking forward to going out fishing this summer.     REVIEW OF SYSTEMS:  A complete review of systems was completed and negative unless documented above in HPI    HOME MEDICATIONS:    Current Outpatient Medications:     albuterol HFA 108 (90 Base) MCG/ACT inhaler, Inhale 2 puffs by mouth every 6 hours as needed for shortness of breath/wheezing., Disp: 6.7 g, Rfl: 3    allopurinol 100 MG tablet, Take 2 tablets (200 mg) by mouth daily., Disp: 180 tablet, Rfl: 3    apixaban (Eliquis) 2.5 MG tablet, Take 1 tablet (2.5 mg) by mouth 2 times a day., Disp: 60 tablet, Rfl: 3    atorvastatin 40 MG tablet, Take 1 tablet (40 mg) by mouth every evening. To lower cholesterol., Disp: 90 tablet, Rfl: 3    blood glucose test strip, Use up to 1 strip daily to check blood sugar., Disp: 100 strip, Rfl: 1    dulaglutide (Trulicity) 0.75 MG/0.5ML auto-injector, Inject 0.75 mg under the skin one time a week., Disp: 2 mL, Rfl: 2    enalapril 5 MG tablet, Take 0.5 tablets (2.5 mg) by mouth daily., Disp: 45 tablet, Rfl: 3    fluticasone propionate HFA 110 MCG/ACT inhaler, Inhale 1 puff by mouth every 12 hours., Disp: 12 g, Rfl: 3    glucometer (OneTouch Verio Flex System) w/Device kit,  Use to check blood sugar 4 times a day., Disp: 1 kit, Rfl: 6    glucose 4 g chewable tablet, Chew and swallow 4 tablets (16 g) by mouth every 15 minutes as needed for low blood sugar. 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    GENERAL: Comfortable appearing, very pleasant man in no acute distress  HEENT: Sclerae anicteric.   CHEST: Normal work of breathing  NEURO: Appropriate gait. Moving all extremities spontaneously   MSK: Normal bulk and tone in all extremities  SKIN: No apparent rashes or lesions    ECOG: 1    LABORATORY STUDIES:  CBC:  Lab Results   Component Value Date/Time    WBC 4.16 (L) 12/01/2022 03:18 PM    HEMOGLOBIN 12.7 (L) 12/01/2022 03:18 PM    HEMATOCRIT 38 12/01/2022 03:18 PM    PLATELET 219 12/01/2022 03:18 PM     Metabolic panel:  Lab Results   Component Value Date/Time    POTASSIUM 4.0 12/01/2022 03:18 PM    CL 106 12/01/2022 03:18 PM    CO2 23 12/01/2022 03:18 PM    BUN 21 12/01/2022 03:18 PM    CREATININE 1.41 (H) 12/01/2022 03:18 PM    AST 15 12/01/2022 03:18 PM    ALT 14 12/01/2022 03:18 PM    ALK 72 12/01/2022 03:18 PM    BILIRUBN 0.6 12/01/2022 03:18 PM    ALBUMIN 4.2 12/01/2022 03:18 PM    PROTEIN 7.1 12/01/2022 03:18 PM     Tumor Markers:  Lab Results   Component Value Date/Time    PSA <0.03 12/01/2022 03:18 PM    PSA 0.18 08/21/2022 03:11 PM    PSA 28.33 (H) 01/09/2022 10:50 AM    PSA 23.06 (H) 11/25/2021 02:01 PM    PSA 28.19 (H) 07/08/2021 02:22 PM     ASSESSMENT:  Nathan [name] is a 73 year old male with new diagnosis of high risk localized (gleason 4+3=7, gg3, at least T3a, PSA 28.3) s/p SBRT to the prostate, SV, and pelvic lymph nodes completed on 06/30/2022 in combination with ADT, here to establish care for long course ADT.     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 Is now s/p definitive radiation to the prostate, SV, and pelvic lymph nodes, in combination with ADT. We reviewed the treatment plan,  which will be to continue with ADT x2 years, to end in 05/2024. He is tolerating ADT well and is remaining active, and actually is losing weight. Notably, he does not meet STAMPEDE criteria to add abi, as he does not have lymph node positive disease, and only meets  1/3 criteria (T3b disease, PSA >40, or gg4+ disease.)  -Next lupron 22.5mg  due 03/19/2023    Genetics   Meets criteria for germline genetic testing given high risk localized disease, will discuss at later date.     FOLLOW UP:  03/19/2023 for labs, provider visit, and lupron

## 2023-09-11 NOTE — Progress Notes (Signed)
 Oncology Infusion Nurse Note    Subjective / Visit Information:    Reason for Visit: Immunization or Injection (Lupron)      Objective:   There were no vitals filed for this visit.    Medication Administrations This Visit         leuprolide (3 month) (Lupron Depot) injection 22.5 mg Admin Date  09/11/2023  13:12 Action  Given Dose  22.5 mg Route  Intramuscular Site  Left Ventrogluteal Documented By  Renato Gails, RN    Ordering Provider: Carin Primrose    [facility]: [id number]            Assessment and Plan:      Summary: Pt arrived ambulatory in stable condition. Pt states they are feeling well overall. Pt tolerated injection well without immediate complication. Pt discharged ambulatory in stable condition.        Additional documentation may exist in Flowsheets or Education Activity

## 2023-09-14 ENCOUNTER — Encounter (HOSPITAL_BASED_OUTPATIENT_CLINIC_OR_DEPARTMENT_OTHER): Payer: Self-pay

## 2023-09-30 ENCOUNTER — Telehealth (HOSPITAL_BASED_OUTPATIENT_CLINIC_OR_DEPARTMENT_OTHER): Payer: Self-pay | Admitting: Internal Medicine

## 2023-09-30 DIAGNOSIS — J3089 Other allergic rhinitis: Secondary | ICD-10-CM

## 2023-09-30 MED ORDER — LORATADINE 10 MG OR TABS
10.0000 mg | ORAL_TABLET | Freq: Every day | ORAL | 5 refills | Status: AC
Start: 2023-09-30 — End: ?

## 2023-09-30 NOTE — Telephone Encounter (Signed)
 RN received call from pt that his allergies came back, he has itchy eyes. He is out of allergy medicine. Pharmacy told him there are no refills left.  Note forwarded to refill pool to refill loratadine 10 mg.

## 2023-10-07 ENCOUNTER — Other Ambulatory Visit (HOSPITAL_BASED_OUTPATIENT_CLINIC_OR_DEPARTMENT_OTHER): Payer: Self-pay

## 2023-10-08 ENCOUNTER — Other Ambulatory Visit (HOSPITAL_BASED_OUTPATIENT_CLINIC_OR_DEPARTMENT_OTHER): Payer: Self-pay

## 2023-10-09 ENCOUNTER — Other Ambulatory Visit (HOSPITAL_BASED_OUTPATIENT_CLINIC_OR_DEPARTMENT_OTHER): Payer: Self-pay

## 2023-10-20 ENCOUNTER — Encounter (HOSPITAL_BASED_OUTPATIENT_CLINIC_OR_DEPARTMENT_OTHER): Payer: Self-pay

## 2023-10-26 ENCOUNTER — Telehealth (HOSPITAL_BASED_OUTPATIENT_CLINIC_OR_DEPARTMENT_OTHER): Payer: Self-pay | Admitting: Internal Medicine

## 2023-10-26 DIAGNOSIS — E1165 Type 2 diabetes mellitus with hyperglycemia: Secondary | ICD-10-CM

## 2023-10-26 NOTE — Telephone Encounter (Signed)
 RN received call from pt that he has three dosages of trulicity left, he wants to make sure he has more refill for it so when he gets closer to the last one it should be ready. He was told this is the last refill he has.    Note forwarded to refill auth pool.

## 2023-10-28 ENCOUNTER — Other Ambulatory Visit (HOSPITAL_BASED_OUTPATIENT_CLINIC_OR_DEPARTMENT_OTHER): Payer: Self-pay

## 2023-10-28 MED ORDER — TRULICITY 0.75 MG/0.5ML SC SOAJ
0.7500 mg | SUBCUTANEOUS | 0 refills | Status: DC
Start: 2023-10-28 — End: 2023-12-08
  Filled 2023-10-28: qty 2, 28d supply, fill #0

## 2023-11-06 ENCOUNTER — Other Ambulatory Visit (HOSPITAL_BASED_OUTPATIENT_CLINIC_OR_DEPARTMENT_OTHER): Payer: Self-pay

## 2023-11-11 ENCOUNTER — Other Ambulatory Visit (HOSPITAL_BASED_OUTPATIENT_CLINIC_OR_DEPARTMENT_OTHER): Payer: Self-pay | Admitting: Internal Medicine

## 2023-11-11 DIAGNOSIS — I2699 Other pulmonary embolism without acute cor pulmonale: Secondary | ICD-10-CM

## 2023-11-13 MED ORDER — ELIQUIS 2.5 MG OR TABS
2.5000 mg | ORAL_TABLET | Freq: Two times a day (BID) | ORAL | 0 refills | Status: DC
Start: 2023-11-13 — End: 2024-02-19

## 2023-11-13 NOTE — Progress Notes (Signed)
 [facility] - Outpatient Return Clinic Note    Date:  11/18/2023    Nathan [name] is a 73 year old male who is here today for   Chief Complaint   Patient presents with    Follow-Up      INTERVAL HISTORY/HPI:    Nathan [name] is a 73 year old man hospitalized 4/14 - 11/05/20 for syncope, PE, NSTEMI, Hyoxic resp failure, HHS, Newly diagnosed DM, Asthma, AKI/CKD, and gout.      Nathan [name] returns for follow-up.     Nathan [name] stated Nathan [name] is working on dietary changes to help control his diabetes  Nathan [name] has requested a referral to Nutrition for dietary support.      Nathan [name] asked for some assistance in completing a form for exercising at [facility], which I provided.      Nathan [name] states Nathan [name] is have some difficulty with erectile functioning related to the anti-testosterone  meds Nathan [name] is taking for prostate cancer.      Nathan [name] confirmed that Nathan [name] is taking Eliquis .     Nathan [name] is receiving treatment with Lupron  (Leuprolide ) negative feedback LHRH agonist, 22.5 mg q 3 months.       Allergies: Grass and cherry blossoms. Nathan [name] reports they have been kicking in late November. Not allergic to ASA       SH:  Nathan [name] has been staying at home.  Nathan [name] lives at home alone, likes to fish in the warmer months.  Nathan [name] states Nathan [name] believes in Nathan [name], and lives on a fixed income.  Nathan [name] likes to "speak when spoken to".        PROBLEM LIST: See Epic Problem List  Patient Active Problem List   Diagnosis    Obesity, unspecified    Generalized osteoarthrosis, unspecified site    Other hyperlipidemia    Presbyopia    Gout    Irregular heart beat    Achilles tendinosis of right lower extremity    Mild intermittent asthma without complication (HCC)    Healthcare maintenance    Non-seasonal allergic rhinitis    Subacute massive pulmonary embolism (HCC)    Type 2 diabetes mellitus with hyperglycemia, with long-term current use of insulin  (HCC)    Syncope    PSA elevation    Prostate cancer (HCC)    Cardiac risk counselling       MEDICATIONS:    Current Outpatient Medications:     albuterol   HFA 108 (90 Base) MCG/ACT inhaler, Inhale 2 puffs by mouth every 6 hours as needed for shortness of breath/wheezing., Disp: 6.7 g, Rfl: 3    allopurinol  100 MG tablet, Take 2 tablets (200 mg) by mouth daily., Disp: 180 tablet, Rfl: 3    apixaban  (Eliquis ) 2.5 MG tablet, Take 1 tablet (2.5 mg) by mouth 2 times a day., Disp: 60 tablet, Rfl: 3    atorvastatin  40 MG tablet, Take 1 tablet (40 mg) by mouth every evening. To lower cholesterol., Disp: 90 tablet, Rfl: 3    blood glucose test strip, Use up to 1 strip daily to check blood sugar., Disp: 100 strip, Rfl: 1    dulaglutide  (Trulicity ) 0.75 MG/0.5ML auto-injector, Inject 0.75 mg under the skin one time a week., Disp: 2 mL, Rfl: 0    enalapril  5 MG tablet, Take 0.5 tablets (2.5 mg) by mouth daily., Disp: 45 tablet, Rfl: 3    fluticasone  propionate HFA 110 MCG/ACT inhaler, Inhale 1 puff by mouth every 12 hours., Disp: 12 g, Rfl: 3    glucometer (OneTouch  Verio Flex System) w/Device kit, Use to check blood sugar 4 times a day., Disp: 1 kit, Rfl: 6    glucose 4 g chewable tablet, Chew and swallow 4 tablets (16 g) by mouth every 15 minutes as needed for low blood sugar. Recheck blood sugar as needed., Disp: 50 tablet, Rfl: 3    ketoconazole  2 % shampoo, apply topically face and scalp if needed, Disp: 120 mL, Rfl: 1    loratadine  10 MG tablet, Take 1 tablet (10 mg) by mouth daily., Disp: 30 tablet, Rfl: 5    Current Facility-Administered Medications:     pneumococcal 20-valent conjugate vaccine (Prevnar 20) injection 0.5 mL, 0.5 mL, Intramuscular, Once, Valera Gaster, MD       ALLERGIES:  Review of patient's allergies indicates:  No Known Allergies    PHYSICAL EXAM:    BP 116/71   Pulse (!) 56   Temp 36.6 C (Temporal)   Wt (!) 109.8 kg (242 lb)   SpO2 98% Comment: ra  BMI 39.06 kg/m       NAD  Exam deferred         LAB AND IMAGING RESULTS:  Lab on 09/11/23   1. Prostate Specific Antigen   Result Value Ref Range    PSA, Diagnostic/Monitoring 0.03 0.00 - 4.00 ng/mL        ASSESSMENT AND PLAN:    1.  Diabetes:  Dulaglutide  = Trulicity  0.75 mg sc q wk and metformin  500 mg bid.      Foot Exam - 11/26/20, 07/04/21, 11/25/21, 09/01/22, completed.   Ophthal Exam:  11/21/22:  DM2 without retinopathy - recommend annual DFE.  Mixed type cataracts, both eyes, mild. Referral placed for annual eye visit. (11/18/23)  Alb/Creat = 7 (11/25/21)       A1C [facility] AMB    A1C   Latest Ref Rng 4.0 - 5.6 %   06/09/2022 6.0    12/01/2022 5.4    07/14/2023 5.7 (H)       2. 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  Nathan [name] is currently taking 2.5 mg daily.       3.  CT Chest:  11/01/20:  Main pulmonary artery measures 3.3 cm suggestive of pulmonary hypertension.   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      4. Cardiac Function.  08/06/21:  TTE Conclusion:  Normal LV size with mildly reduced systolic function. Regional wall motion abnormalities shown below.  Normal RV size and systolic function. Normal RA pressure. Could not evaluate PA pressure. RV/LV ratio 0.7 which is normal.  Normal valve structure and function.  Compared with prior study from 4/14//22, RV size and systolic function has improved.    5. Asthma.  Nathan [name] uses -Fluticasone  110 mcg BID  -Albuterol  Q6hr PRN and has a stable pattern. PFTs (08/29/22):  FEV1/FVC 88%, FEVI 64% predicted.  Interpretation:  Narrative & Impression: No obstructive disorder. Spirometry suggests a possible mild restrictive disorder.        6. HTN. Enalapril  2.5 mg daily.      Vitals     Systolic Diastolic   09/11/2023 118  63    11/18/2023 116  71      7. Cardiac Risk Factors.  BP 124/62 off meds, BMI 47, LDL 96, DM no, No FH CAD, No tobacco. No MI or Stroke.  EKG (11/01/21): NSR, Axis - 52 (LAD), PR N, QRS 168,  QTc 459, RBBB, LAHB, No Q-waves. ASCVD Risk 9.8%.  Atorvastatin  40 mg qd.       Cholesterol    LDL Cholesterol   Latest Ref Rng <130 mg/dL   84/16/6063 62     01/60/1093 74    07/14/2023 88      The 10-year ASCVD risk score (Arnett DK, et al., 2019) is: 27.1%    Values used to calculate the score:      Age: 60 years      Sex: Male      Is Non-Hispanic African American: Yes      Diabetic: Yes      Tobacco smoker: No      Systolic Blood Pressure: 118 mmHg      Is BP treated: Yes      HDL Cholesterol: 59 mg/dL      Total Cholesterol: 161 mg/dL       8. CKD (Grade 3a).  Stable pattern over time.     Prot / Creat < 0.1 (02/22/16)   Alb/Creat 7 (05/20/21), 4 (12/01/22)  GFR 54 (12/01/22), 49 (07/14/23) Stage 3a.       Renal Function Testing    Creatinine   Latest Ref Rng 0.51 - 1.18 mg/dL   08/24/5571 2.20 (H)    12/01/2022 1.41 (H)    03/12/2023 1.74 (H)    06/24/2023 1.50 (H)          9.  Prostate Cancer:  High risk localized (gleason 4+3=7, gg3, at least T3a, PSA 28.3) s/p SBRT to the prostate, SV, and pelvic lymph nodes completed on 06/30/2022 in combination with ADT.  Nathan [name] is continuing long term ADT therapy.        Latest Reference Range & Units 06/24/23 15:08 07/14/23 15:40 09/11/23 13:06   Prostate Specific Antigen 0.00 - 4.00 ng/mL 0.03 0.03 0.03        10.  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  Viagra and Cialis  have been recommended.     11. Chronic gout.  Nathan [name] report intermittent foot pain but is non-specific on interview.  Records indicate last flare podagra (L great toe) in June 2017.  Nathan [name] is minimizing red meat, and no alcohol.  Nathan [name] has CKD therefore goal is to use tylenol  over NSAIDs for pain.  Nathan [name] is on Allopurinol  200 mg daily, Colchicine  0.6 mg 2 tabs for flare, then 1 tab 1 hr later.  Uric Acid 7.4 (07/07/18).  CrCl at 50 mL/min, allopurinol  and colchicine  doses acceptable.  ASA has been stopped due to gout and CKD.  (stable 03/09/23)     12.  Social:  Nathan [name] has no DPOA.  But his brother Nathan [name] who lives in [place] calls and checks on him the most.  Nathan [name] would want Nathan [name] involved if her were unable to make decisions for himself.         HEALTH CARE MAINTANENCE / SCREENING:   (See below)      PHQ 2 Screening:         PHQ-9     PHQ2 Total Score   07/11/2019 0    04/23/2022 0    06/30/2022 0       Colorectal Cancer Screening:  FIT negative (07/23/18 and 07/10/21). Patient declines colonoscopy.   SDM, Nathan [name] states Nathan [name] does not desire a colonoscopy in the future.  (11/18/23)     Latest Reference Range & Units 07/23/18 22:18 07/26/19 01:00 07/10/21 23:55 10/08/22 01:46 08/04/23 22:14   Occult Bld 1 Result NRN  Negative  Negative Negative Negative Negative     Tobacco Use:  Quit at age 52.  ETOH Use:  Occ use   Marijuana:  Occ      Hepatitis C / HIV Screening:  HIV negative 2013.  Hep C (1945-65 + high risk): Neg 2013.   Nathan [name] declines a Hep B blood test.       AAA screening.  02/14/22: CT ABD w/o contrast: "Vasculature: Scattered atherosclerosis without aneurysm. "     Pneumococcal:   IF Age > 65.          1) If PPSV23 Only, PCV20 > 1 yr;  2)  If Both PPSV23/PCV13, PCV20 after 5 yrs after last pnx (SDM if PPSV23 last given was > 65); 3) If NO prior pnx - Give PCV20: Due for PCV20. - declines   07/14/23  Shingrix (> Age 8, +/- Zostavax or shingles) - 2 doses:  Will discuss with patient. Declines 07/14/23   Tdap, then Td every 10 yrs: Tdap 2012, due for Td.  Declines 07/14/23.    Influenza: declines 07/14/23  COVID: declines 07/14/23     Immunization History   Administered Date(s) Administered    COVID-19 Moderna mRNA monovalent 12 yrs and older 09/20/2019, 10/18/2019    COVID-19 Pfizer mRNA bivalent 12 yrs and older 07/04/2021    COVID-19 Pfizer mRNA monovalent tris-sucrose 12 yrs and older 11/26/2020    Hepatitis B, unspecified 10/05/2007, 11/05/2007, 05/08/2008    Influenza quadrivalent PF 04/19/2013, 06/12/2014, 05/01/2015    Influenza quadrivalent adjuvanted (Fluad) 05/19/2019    Influenza trivalent PF 07/07/2012    Influenza, unspecified 09/26/2010    Pneumococcal polysaccharide PPSV23 (Pneumovax 23) 02/12/2009    Tdap 01/21/2011   Pended Date(s) Pended    Pneumococcal conjugate PCV20 (Prevnar 20)  03/11/2023     Future Appointments   Date Time Provider Department Center   11/18/2023  1:30 PM Valera Gaster, MD [facility] AM   12/02/2023  1:30 PM [facility] GU VISIT [id number]   12/02/2023  2:15 PM [facility] GU VISIT [id number]   12/02/2023  2:45 PM Lennie Ra, MD [facility] ON[facility]     F/U Dr. Cecilio Coffer in 4 months.    Total time spent in the direct care of this patient and in reviewing records and documentation on the date of service was 25  minutes.

## 2023-11-18 ENCOUNTER — Ambulatory Visit: Payer: Medicare HMO | Attending: Internal Medicine | Admitting: Internal Medicine

## 2023-11-18 VITALS — BP 116/71 | HR 56 | Temp 97.9°F | Wt 242.0 lb

## 2023-11-18 DIAGNOSIS — N1831 Chronic kidney disease, stage 3a: Secondary | ICD-10-CM

## 2023-11-18 DIAGNOSIS — Z794 Long term (current) use of insulin: Secondary | ICD-10-CM | POA: Insufficient documentation

## 2023-11-18 DIAGNOSIS — C61 Malignant neoplasm of prostate: Secondary | ICD-10-CM

## 2023-11-18 DIAGNOSIS — E1136 Type 2 diabetes mellitus with diabetic cataract: Secondary | ICD-10-CM

## 2023-11-18 DIAGNOSIS — E1122 Type 2 diabetes mellitus with diabetic chronic kidney disease: Secondary | ICD-10-CM

## 2023-11-18 DIAGNOSIS — E1165 Type 2 diabetes mellitus with hyperglycemia: Secondary | ICD-10-CM | POA: Insufficient documentation

## 2023-11-18 DIAGNOSIS — J45909 Unspecified asthma, uncomplicated: Secondary | ICD-10-CM

## 2023-11-18 DIAGNOSIS — Z79899 Other long term (current) drug therapy: Secondary | ICD-10-CM

## 2023-11-18 DIAGNOSIS — Z0000 Encounter for general adult medical examination without abnormal findings: Secondary | ICD-10-CM | POA: Insufficient documentation

## 2023-11-18 DIAGNOSIS — I129 Hypertensive chronic kidney disease with stage 1 through stage 4 chronic kidney disease, or unspecified chronic kidney disease: Secondary | ICD-10-CM

## 2023-11-18 DIAGNOSIS — M1A9XX1 Chronic gout, unspecified, with tophus (tophi): Secondary | ICD-10-CM

## 2023-11-18 NOTE — Patient Instructions (Addendum)
 For your diabetes, you are planning to make a eye doctor appointment.      I have placed a referral nutrition based on your request.      I helped you complete a form for [employer].

## 2023-11-19 ENCOUNTER — Telehealth (HOSPITAL_BASED_OUTPATIENT_CLINIC_OR_DEPARTMENT_OTHER): Payer: Self-pay | Admitting: Internal Medicine

## 2023-11-19 NOTE — Telephone Encounter (Signed)
 Received referral for patient to schedule an appointment with [facility] Nutritionist. Spoke to patient and scheduled an appointment.

## 2023-11-25 ENCOUNTER — Ambulatory Visit: Attending: Internal Medicine

## 2023-11-25 DIAGNOSIS — E1165 Type 2 diabetes mellitus with hyperglycemia: Secondary | ICD-10-CM | POA: Insufficient documentation

## 2023-11-25 DIAGNOSIS — Z794 Long term (current) use of insulin: Secondary | ICD-10-CM | POA: Insufficient documentation

## 2023-11-25 NOTE — Progress Notes (Unsigned)
 Nutrition Note    Clinic:  Adult Medicine    Note Type: Follow-up    Last RD visit w/ Mariah Shines on 10/20/2022    Reason For Visit: E11.65, Z79.4 (ICD-10-CM) - 250.00, 790.29, V58.67 (ICD-9-CM) - Type 2 diabetes mellitus with hyperglycemia, with long-term current use of insulin  [facility])     Referral: Dr Cecilio Coffer 11/18/2023    An interpreter was not needed for the visit.      Assessment  Patient Active Problem List   Diagnosis    Obesity, unspecified    Generalized osteoarthrosis, unspecified site    Other hyperlipidemia    Presbyopia    Gout    Irregular heart beat    Achilles tendinosis of right lower extremity    Mild intermittent asthma without complication (HCC)    Non-seasonal allergic rhinitis    Subacute massive pulmonary embolism (HCC)    Type 2 diabetes mellitus with hyperglycemia, with long-term current use of insulin  (HCC)    Syncope    PSA elevation    Prostate cancer (HCC)    Cardiac risk counselling       Weight History:    Vitals     Weight (pounds) Weight (kg)   07/28/2022 236 lb 4.8 oz !  107.185 kg !    09/22/2022 231 lb !  104.781 kg !    10/20/2022 231 lb !  104.781 kg !    12/01/2022 232 lb 11.2 oz !  105.552 kg !    12/19/2022 229 lb !  103.874 kg !    01/09/2023 226 lb 6.4 oz !  102.694 kg !    03/09/2023 224 lb !  101.606 kg !    03/12/2023 228 lb 3.2 oz !  103.511 kg !    03/19/2023 227 lb !  102.967 kg !    04/20/2023 227 lb !  102.967 kg !    05/13/2023 232 lb 11.2 oz !  105.552 kg !    05/18/2023 225 lb !  102.059 kg !    06/24/2023 236 lb 9.6 oz !  107.321 kg !    07/14/2023 239 lb !  108.41 kg !    09/11/2023 239 lb !  108.41 kg !    11/18/2023 242 lb !  109.77 kg !         Adult BMI Classification: obese categ. II (35- 39.9) - based on goal BMI 22-27 for >65yo     Labs:    DIABETES [facility] AMB    A1C   Latest Ref Rng 4.0 - 6.0 %   12/01/2022 5.4    07/14/2023 5.7 (H)     07/14/2023: lipids, folate, B12 wnl      Self monitored blood glucose: Yes - has CGM but did not bring monitor, reports BG was 150mg /dL this AM, usually  not >180mg /dL - notices increase with white rice    Medications include:  -Trulicity  .75mg /wk   -atorvastatin : 40mg     Vitamins/Minerals/Herbal Supplements: not discussed  Previous: started using apple cider vinegar 1tsp every other day (friend told him it could help w/ weight and diabetes)    Barriers to adequate intake: none    GI tolerance: None    Activity:  hoping to start fishing again soon, walking throughout week  Previous: fishing, some walking involved not as active since going through chemo/radiation for cancer    Food intolerances/allergies: None     Diet History:   Now consuming 3 meals/day w/ snacks throughout day:  -  B: eggs (~3x) but does not really like them  -L: pb and j sandwhich using blackberry jam on wheat bread  -D: white rice and chicken  -Sn: cheese/crackers, smoked salmon/crackers, fruit (grapes, strawberry, orange, watermelon)  -Dr: almond milk, water (?)    -Enjoys items like pasta salad   -Does not like brown rice, does not sit well  -Tries to limit items like chips, most meat, white bread  -Smokes the salmon he catches, does not use a lot of salt  -Gets a lot of dietary advice from [employer]    First Visit:  No red meat since ~1980's, eggs none since ~Jan 2024, likes chicken, salmon  B: frosted cheerios  L: not often  D: 4-5p eats boiled chicken/turkey hotdog/fish,veg/ salad;  Red beans and rice, sandwiches   Snacks: potato chips/crackers and cheese  Beverages: zero sugar gatorade, cranberry juice- heard this was healthy  Does not use salt      Information from Patient:   -Patient presents with multiple questions about diet. Primary concerns are what fruits are okay to eat, which vegetables "burn fat in arteries", which fruits or vegetables can help with sexual functioning, and what meat/grains he should eat.  -Watches a lot of Youtube and gets overwhelmed/concerned when watching recc's from influencers.  -Asked about which supplements can help with liver detox OR what supplements he  should be taking in general.     First Visit:  -Learning more about nutrition management helpful- better in small chunks of information  -Would like to know what he needs to do to not need insulin   -Pt had many questions about individual foods based on videos he'd seen on YouTube (e.g. sourdough, gluten, olive oil, tomatoes).       Assessment Summary: 73 y/o English speaking male recently hospitalized during 4/14 - 11/05/20 for syncope, PE, NSTEMI, Hyoxic resp failure, HHS, Newly diagnosed DM, Asthma, AKI/CKD, and gout.   -Ref placed for annual eye visit  -Patient was unable to recall most of previous nutrition visit topics - would benefit from small goal setting with continued focus on what cho are and sources     Nutrition Diagnosis: Food and nutrition knowledge deficit r/t diabetes management as evidenced by patient report and line of questioning.        Interventions and Patient Goals:  -Attempted to discuss current intake and tolerance. Some difficulty assessing as patient had many questions and would jump from topic to topic rapidly.  -Reviewed what a cho is and sources. Encouraged patient to use fist to gage portion sizes.  -Informed patient about de-regulation of supplements and encouraged patient to limit purchasing them as many have claims not based in sound data.  -Discussed what fiber is and importance for limiting BG spikes post-prandial.  -Informed patient that all veg are good, and encouraged him to make most of plate non-starchy veg.  -Reviewed how benefits of fruit/veg is more preventative rather than reversability.    -Use plate method to guide meal decisions  -Use fist to gage portion sizes of fruit  -Add veg to meals  -Continue to choose whole grains as able      Education Provided:  Please refer to Education section of chart     Education materials provided: ADA Plate Method handout - reviewed in-depth with patient   Previous: Insulin  dosage plan, My Food Plan, Insulin  instructions in AVS    Time  Spent with Patient: 45  mins      Plan/Recommendations  Follow up: None - as requested by patient     Total Time Spent:  75 mins      Carla Charon, MPH, RD  [facility] Ambulatory Outpatient Dietitian  Box: [phone]  [address], [place] [address]  Outpatient consult phone: [phone]

## 2023-12-02 ENCOUNTER — Ambulatory Visit

## 2023-12-02 ENCOUNTER — Ambulatory Visit (HOSPITAL_BASED_OUTPATIENT_CLINIC_OR_DEPARTMENT_OTHER)

## 2023-12-02 ENCOUNTER — Ambulatory Visit (HOSPITAL_BASED_OUTPATIENT_CLINIC_OR_DEPARTMENT_OTHER): Admitting: Student in an Organized Health Care Education/Training Program

## 2023-12-02 VITALS — BP 118/72 | HR 63 | Temp 97.9°F | Resp 18 | Wt 241.4 lb

## 2023-12-02 DIAGNOSIS — Z5111 Encounter for antineoplastic chemotherapy: Secondary | ICD-10-CM | POA: Insufficient documentation

## 2023-12-02 DIAGNOSIS — C61 Malignant neoplasm of prostate: Secondary | ICD-10-CM

## 2023-12-02 LAB — CBC (HEMOGRAM)
Hematocrit: 40 % (ref 38.0–50.0)
Hemoglobin: 13.7 g/dL (ref 13.0–18.0)
MCH: 29.6 pg (ref 27.3–33.6)
MCHC: 34.1 g/dL (ref 32.2–36.5)
MCV: 87 fL (ref 81–98)
Platelet Count: 228 10*3/uL (ref 150–400)
RBC: 4.63 10*6/uL (ref 4.40–5.60)
RDW-CV: 13.4 % (ref 11.0–14.5)
WBC: 4.16 10*3/uL — ABNORMAL LOW (ref 4.3–10.0)

## 2023-12-02 LAB — COMPREHENSIVE METABOLIC PANEL
ALT (GPT): 15 U/L (ref 10–48)
AST (GOT): 17 U/L (ref 9–38)
Albumin: 4.7 g/dL (ref 3.5–5.2)
Alkaline Phosphatase (Total): 103 U/L (ref 36–161)
Anion Gap: 9 (ref 4–12)
Bilirubin (Total): 0.7 mg/dL (ref 0.2–1.3)
Calcium: 10.1 mg/dL (ref 8.9–10.2)
Carbon Dioxide, Total: 28 meq/L (ref 22–32)
Chloride: 102 meq/L (ref 98–108)
Creatinine: 1.69 mg/dL — ABNORMAL HIGH (ref 0.51–1.18)
Glucose: 115 mg/dL (ref 62–125)
Potassium: 4.7 meq/L (ref 3.6–5.2)
Protein (Total): 7.8 g/dL (ref 6.0–8.2)
Sodium: 139 meq/L (ref 135–145)
Urea Nitrogen: 34 mg/dL — ABNORMAL HIGH (ref 8–21)
eGFR by CKD-EPI 2021: 43 mL/min/{1.73_m2} — ABNORMAL LOW (ref >59)

## 2023-12-02 LAB — PSA, DIAGNOSTIC/MONITORING: PSA, Diagnostic/Monitoring: 0.03 ng/mL (ref 0.00–4.00)

## 2023-12-02 MED ORDER — LEUPROLIDE ACETATE (3 MONTH) 22.5 MG IM KIT
22.5000 mg | PACK | Freq: Once | INTRAMUSCULAR | Status: AC
Start: 2023-12-02 — End: 2023-12-02
  Administered 2023-12-02: 22.5 mg via INTRAMUSCULAR
  Filled 2023-12-02: qty 1

## 2023-12-02 MED ORDER — TADALAFIL 5 MG OR TABS
5.0000 mg | ORAL_TABLET | Freq: Every day | ORAL | 0 refills | Status: AC | PRN
Start: 2023-12-02 — End: 2024-01-01

## 2023-12-02 NOTE — Progress Notes (Signed)
 Nathan [name]  GENITOURINARY ONCOLOGY CLINIC NOTE    ONCOLOGY CARE TEAM:  Patient Care Team:  Philis Brazier, MD as Surgical Oncologist (Urology)  Darlen Eglin, MD as Radiation Oncologist (Radiation Oncology)  Lennie Ra, MD as Oncologist (Hematology-Oncology)    REFERRED BY: Dr. Darlen Eglin     IDENTIFICATION/CC:  Nathan [name] is a 73 year old male with new diagnosis of high risk localized (gleason 4+3=7, gg3, at least T3a, PSA 28.3) s/p SBRT to the prostate, SV, and pelvic lymph nodes completed on 06/30/2022 in combination with ADT, here to continue long term ADT.     CURRENT TREATMENT:  ADT since 05/26/2022  S/p SBRT to prostate, SV, and lymph nodes completed 06/30/2022    TREATMENT HISTORY:  Oncology History Overview   07/08/2021: PSA 28.19  11/25/2021: PSA 23.06  01/09/2022: PSA 28.33  01/24/2022: Prostate biopsy with gleason 4+3=7 prostate cancer in 11/12 cores, 30% pattern 4  02/14/2022: CT abdomen/pelvis shows sclerotic lesions in the pelvis perhaps concerning for prostate cancer  03/27/2023: PSMA PET shows tracer uptake in the PZ from apex to bilateral mid and L base extending to SV. No PSMA evident nodal or osseous met  05/26/2022: MRI Prostate with PIRADS 5 lesion, no macroscopic ECE, likely L SV invasion, no lympadenopathy or bone lesions. Starts ADT with lupron  22.5mg  q3 months  06/18/2022 -06/30/2022: SBRT to the prostate, SV, and pelvic lymph nodes + ADT, 4200cGy in 5 fractions   08/21/2022: PSA 0.18  12/01/2022: PSA undetectable         HPI:  Mr. Hinderliter presents today for lupron  as part of long course ADT for localized prostate cancer. Is doing well on hormone therapy without major symptoms or concerns, but is still struggling with erectile dysfunction.     REVIEW OF SYSTEMS:  A complete review of systems was completed and negative unless documented above in HPI    HOME MEDICATIONS:    Current Outpatient Medications:     albuterol  HFA 108 (90 Base) MCG/ACT inhaler, Inhale 2 puffs  by mouth every 6 hours as needed for shortness of breath/wheezing., Disp: 6.7 g, Rfl: 3    allopurinol  100 MG tablet, Take 2 tablets (200 mg) by mouth daily., Disp: 180 tablet, Rfl: 3    apixaban  (Eliquis ) 2.5 MG tablet, TAKE ONE TABLET BY MOUTH TWICE DAILY, Disp: 180 tablet, Rfl: 0    atorvastatin  40 MG tablet, Take 1 tablet (40 mg) by mouth every evening. To lower cholesterol., Disp: 90 tablet, Rfl: 3    blood glucose test strip, Use up to 1 strip daily to check blood sugar., Disp: 100 strip, Rfl: 1    dulaglutide  (Trulicity ) 0.75 MG/0.5ML auto-injector, Inject 0.75 mg under the skin one time a week., Disp: 2 mL, Rfl: 0    enalapril  5 MG tablet, Take 0.5 tablets (2.5 mg) by mouth daily., Disp: 45 tablet, Rfl: 3    fluticasone  propionate HFA 110 MCG/ACT inhaler, Inhale 1 puff by mouth every 12 hours., Disp: 12 g, Rfl: 3    glucometer (OneTouch Verio Flex System) w/Device kit, Use to check blood sugar 4 times a day., Disp: 1 kit, Rfl: 6    glucose 4 g chewable tablet, Chew and swallow 4 tablets (16 g) by mouth every 15 minutes as needed for low blood sugar. Recheck blood sugar as needed., Disp: 50 tablet, Rfl: 3    ketoconazole  2 % shampoo, apply topically face and scalp if needed, Disp: 120 mL, Rfl: 1  loratadine  10 MG tablet, Take 1 tablet (10 mg) by mouth daily., Disp: 30 tablet, Rfl: 5     PAST MEDICAL HISTORY:  Prostate cancer as above   VTE (prior PE on anticoagulation)   TII DM (on trulicity , metformin )  Gout   Asthma, mild intermittent   Hyperlipidemia   OA     SOCIAL HISTORY:  Lives in [place]   Enjoys fishing, looking forward to upcoming season   Friends and family nearby     PHYSICAL EXAMINATION:  VITAL SIGNS:   Vitals:    12/02/23 1456   BP: 118/72   Pulse: 63   Resp: 18   Temp: 36.6 C   SpO2: 97%     GENERAL: Comfortable appearing, very pleasant man in no acute distress  HEENT: Sclerae anicteric.   CHEST: Normal work of breathing  NEURO: Appropriate gait. Moving all extremities spontaneously   MSK:  Normal bulk and tone in all extremities  SKIN: No apparent rashes or lesions    ECOG: 1    LABORATORY STUDIES:  CBC:  Lab Results   Component Value Date/Time    WBC 4.16 (L) 12/02/2023 12:53 PM    HEMOGLOBIN 13.7 12/02/2023 12:53 PM    HEMATOCRIT 40 12/02/2023 12:53 PM    PLATELET 228 12/02/2023 12:53 PM     Metabolic panel:  Lab Results   Component Value Date/Time    POTASSIUM 4.7 12/02/2023 12:53 PM    CL 102 12/02/2023 12:53 PM    CO2 28 12/02/2023 12:53 PM    BUN 34 (H) 12/02/2023 12:53 PM    CREATININE 1.69 (H) 12/02/2023 12:53 PM    AST 17 12/02/2023 12:53 PM    ALT 15 12/02/2023 12:53 PM    ALK 103 12/02/2023 12:53 PM    BILIRUBN 0.7 12/02/2023 12:53 PM    ALBUMIN 4.7 12/02/2023 12:53 PM    PROTEIN 7.8 12/02/2023 12:53 PM     Tumor Markers:  Lab Results   Component Value Date/Time    PSA 0.03 12/02/2023 12:53 PM    PSA 0.03 09/11/2023 01:06 PM    PSA 0.03 07/14/2023 03:40 PM    PSA 0.03 06/24/2023 03:08 PM    PSA <0.03 05/13/2023 03:25 PM    PSA <0.03 03/12/2023 12:12 PM    PSA <0.03 12/01/2022 03:18 PM    PSA 0.18 08/21/2022 03:11 PM    PSA 28.33 (H) 01/09/2022 10:50 AM    PSA 23.06 (H) 11/25/2021 02:01 PM    PSA 28.19 (H) 07/08/2021 02:22 PM     ASSESSMENT:  Nathan [name] is a 74 year old male with new diagnosis of high risk localized (gleason 4+3=7, gg3, at least T3a, PSA 28.3) s/p SBRT to the prostate, SV, and pelvic lymph nodes completed on 06/30/2022 in combination with ADT, here to continue long term ADT.     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 Is now s/p definitive radiation to the prostate, SV, and pelvic lymph nodes, in combination with ADT. Notably, he does not meet STAMPEDE criteria to add abi, as he does not have lymph node positive disease, and only meets 1/3 criteria (T3b disease, PSA >40, or gg4+ disease.)We reviewed the treatment plan, which  will be to continue with ADT x2 years, to end in 05/2024. He is tolerating ADT well.   -Next lupron  22.5mg  due 08/08/202, will be last ADT.  Erectile dysfunction   Secondary to ADT, we discussed that medications such as viagra and cialis  are safe to use during treatment for prostate cancer. He has been seeing ads for other drugs that are advertised to enhanced sexual performance and is wondering if those may be safe. I recommended that he reach out to us  with the name of the product before he takes any product to make sure it does not have any testosterone  products that may impact his prostate cancer care.   -He does not think that ED medications will be covered by his insurance, but prescribed tadalafil  per his preference.     Genetics   Meets criteria for germline genetic testing given high risk localized disease, not yet complete     FOLLOW UP:  02/26/2024 for labs, provider visit, and last lupron 

## 2023-12-02 NOTE — Progress Notes (Signed)
 Oncology Infusion Nurse Note    Subjective / Visit Information:    Reason for Visit: Immunization or Injection      Objective:       Medication Administrations This Visit         leuprolide  (3 month) (Lupron  Depot) injection 22.5 mg Admin Date  12/02/2023  13:06 Action  Given Dose  22.5 mg Route  Intramuscular Site  Right Ventrogluteal Documented By  Gussie Legato, RN    Ordering Provider: Marley Simmers    [facility]: [id number]            Assessment and Plan:            Hand off: No    Discharge:   Patient tolerated treatment well and without complications.  Discharge disposition:   Accompanied by: Self  Discharged to: Home  Response: Aware of existing appointments and Aware of clinic contact information    Summary: Patient here for Lupron . Tolerated OK in R ventrogluteal. Left ambulatory to lobby for next appt.       Additional documentation may exist in Flowsheets or Education Activity

## 2023-12-06 ENCOUNTER — Other Ambulatory Visit (HOSPITAL_BASED_OUTPATIENT_CLINIC_OR_DEPARTMENT_OTHER): Payer: Self-pay | Admitting: Internal Medicine

## 2023-12-06 DIAGNOSIS — E1165 Type 2 diabetes mellitus with hyperglycemia: Secondary | ICD-10-CM

## 2023-12-08 ENCOUNTER — Other Ambulatory Visit (HOSPITAL_BASED_OUTPATIENT_CLINIC_OR_DEPARTMENT_OTHER): Payer: Self-pay

## 2023-12-08 ENCOUNTER — Telehealth (HOSPITAL_BASED_OUTPATIENT_CLINIC_OR_DEPARTMENT_OTHER): Payer: Self-pay | Admitting: Internal Medicine

## 2023-12-08 DIAGNOSIS — E1165 Type 2 diabetes mellitus with hyperglycemia: Secondary | ICD-10-CM

## 2023-12-08 MED ORDER — TRULICITY 0.75 MG/0.5ML SC SOAJ
0.7500 mg | SUBCUTANEOUS | 3 refills | Status: DC
Start: 2023-12-08 — End: 2024-03-30

## 2023-12-08 NOTE — Telephone Encounter (Signed)
 RN received call from patient that he requested refill of trulicity  2 days ago, he called his pharmacy today they said they haven't received ok from PCP. Patient said he has one refill left.    Note forwarded to refill auth pool.

## 2023-12-28 ENCOUNTER — Encounter (HOSPITAL_BASED_OUTPATIENT_CLINIC_OR_DEPARTMENT_OTHER): Payer: Self-pay

## 2024-02-17 ENCOUNTER — Other Ambulatory Visit (HOSPITAL_BASED_OUTPATIENT_CLINIC_OR_DEPARTMENT_OTHER): Payer: Self-pay | Admitting: Internal Medicine

## 2024-02-17 DIAGNOSIS — I2699 Other pulmonary embolism without acute cor pulmonale: Secondary | ICD-10-CM

## 2024-02-19 MED ORDER — APIXABAN 2.5 MG OR TABS
2.5000 mg | ORAL_TABLET | Freq: Two times a day (BID) | ORAL | 0 refills | Status: DC
Start: 2024-02-19 — End: 2024-05-19

## 2024-02-24 ENCOUNTER — Ambulatory Visit (HOSPITAL_BASED_OUTPATIENT_CLINIC_OR_DEPARTMENT_OTHER)

## 2024-02-24 ENCOUNTER — Ambulatory Visit (HOSPITAL_BASED_OUTPATIENT_CLINIC_OR_DEPARTMENT_OTHER): Admitting: Student in an Organized Health Care Education/Training Program

## 2024-02-24 ENCOUNTER — Ambulatory Visit: Attending: Student in an Organized Health Care Education/Training Program

## 2024-02-24 DIAGNOSIS — Z5111 Encounter for antineoplastic chemotherapy: Secondary | ICD-10-CM | POA: Insufficient documentation

## 2024-02-24 DIAGNOSIS — C61 Malignant neoplasm of prostate: Secondary | ICD-10-CM

## 2024-02-24 LAB — COMPREHENSIVE METABOLIC PANEL
ALT (GPT): 20 U/L (ref 10–48)
AST (GOT): 21 U/L (ref 9–38)
Albumin: 4.1 g/dL (ref 3.5–5.2)
Alkaline Phosphatase (Total): 93 U/L (ref 36–161)
Anion Gap: 2 — ABNORMAL LOW (ref 4–12)
Bilirubin (Total): 0.6 mg/dL (ref 0.2–1.3)
Calcium: 9.3 mg/dL (ref 8.9–10.2)
Carbon Dioxide, Total: 32 meq/L (ref 22–32)
Chloride: 109 meq/L — ABNORMAL HIGH (ref 98–108)
Creatinine: 1.57 mg/dL — ABNORMAL HIGH (ref 0.51–1.18)
Glucose: 94 mg/dL (ref 62–125)
Potassium: 4.4 meq/L (ref 3.6–5.2)
Protein (Total): 6.8 g/dL (ref 6.0–8.2)
Sodium: 143 meq/L (ref 135–145)
Urea Nitrogen: 20 mg/dL (ref 8–21)
eGFR by CKD-EPI 2021: 47 mL/min/1.73_m2 — ABNORMAL LOW (ref >59)

## 2024-02-24 LAB — CBC (HEMOGRAM)
Hematocrit: 37 % — ABNORMAL LOW (ref 38.0–50.0)
Hemoglobin: 12 g/dL — ABNORMAL LOW (ref 13.0–18.0)
MCH: 29.1 pg (ref 27.3–33.6)
MCHC: 32.5 g/dL (ref 32.2–36.5)
MCV: 89 fL (ref 81–98)
Platelet Count: 188 10*3/uL (ref 150–400)
RBC: 4.13 10*6/uL — ABNORMAL LOW (ref 4.40–5.60)
RDW-CV: 14.3 % (ref 11.0–14.5)
WBC: 4.6 10*3/uL (ref 4.3–10.0)

## 2024-02-24 LAB — PSA, DIAGNOSTIC/MONITORING: PSA, Diagnostic/Monitoring: <0.03 ng/mL (ref 0.00–4.00)

## 2024-02-24 MED ORDER — LEUPROLIDE ACETATE (3 MONTH) 22.5 MG IM KIT
22.5000 mg | PACK | Freq: Once | INTRAMUSCULAR | Status: AC
Start: 2024-02-24 — End: 2024-02-24
  Administered 2024-02-24: 22.5 mg via INTRAMUSCULAR
  Filled 2024-02-24: qty 1

## 2024-02-24 NOTE — Progress Notes (Signed)
 [facility]  GENITOURINARY ONCOLOGY CLINIC NOTE    ONCOLOGY CARE TEAM:  Patient Care Team:  Ina Gladstone Beans, MD as Surgical Oncologist (Urology)  Honor Lunger, MD as Radiation Oncologist (Radiation Oncology)  Fernand Wynell HERO, MD as Oncologist (Hematology-Oncology)    REFERRED BY: Dr. Lunger Honor     IDENTIFICATION/CC:  Nathan [name] is a 73 year old male with new diagnosis of high risk localized (gleason 4+3=7, gg3, at least T3a, PSA 28.3) s/p SBRT to the prostate, SV, and pelvic lymph nodes completed on 06/30/2022 in combination with ADT, here today for routine follow up and lupron .     CURRENT TREATMENT:  ADT since 05/26/2022  S/p SBRT to prostate, SV, and lymph nodes completed 06/30/2022    TREATMENT HISTORY:  Oncology History Overview   07/08/2021: PSA 28.19  11/25/2021: PSA 23.06  01/09/2022: PSA 28.33  01/24/2022: Prostate biopsy with gleason 4+3=7 prostate cancer in 11/12 cores, 30% pattern 4  02/14/2022: CT abdomen/pelvis shows sclerotic lesions in the pelvis perhaps concerning for prostate cancer  03/27/2023: PSMA PET shows tracer uptake in the PZ from apex to bilateral mid and L base extending to SV. No PSMA evident nodal or osseous met  05/26/2022: MRI Prostate with PIRADS 5 lesion, no macroscopic ECE, likely L SV invasion, no lympadenopathy or bone lesions. Starts ADT with lupron  22.5mg  q3 months  06/18/2022 -06/30/2022: SBRT to the prostate, SV, and pelvic lymph nodes + ADT, 4200cGy in 5 fractions   08/21/2022: PSA 0.18  12/01/2022: PSA undetectable         HPI:  Mr. Youngblood presents today for lupron  as part of long course ADT for localized prostate cancer. No noticeable symptoms, and he is generally feeling well. Has gained more weight and attributes this to eating more than usual. Has also not been exercising and does plan to get back in the gym. Picked up cialis  but has not used it.     REVIEW OF SYSTEMS:  A complete review of systems was completed and negative unless documented above in  HPI    HOME MEDICATIONS:    Current Outpatient Medications:     albuterol  HFA 108 (90 Base) MCG/ACT inhaler, Inhale 2 puffs by mouth every 6 hours as needed for shortness of breath/wheezing., Disp: 6.7 g, Rfl: 3    allopurinol  100 MG tablet, Take 2 tablets (200 mg) by mouth daily., Disp: 180 tablet, Rfl: 3    apixaban  (Eliquis ) 2.5 MG tablet, Take 1 tablet (2.5 mg) by mouth 2 times a day., Disp: 180 tablet, Rfl: 0    atorvastatin  40 MG tablet, Take 1 tablet (40 mg) by mouth every evening. To lower cholesterol., Disp: 90 tablet, Rfl: 3    blood glucose test strip, Use up to 1 strip daily to check blood sugar., Disp: 100 strip, Rfl: 1    dulaglutide  (Trulicity ) 0.75 MG/0.5ML auto-injector, Inject 0.75 mg under the skin one time a week., Disp: 2 mL, Rfl: 3    enalapril  5 MG tablet, Take 0.5 tablets (2.5 mg) by mouth daily., Disp: 45 tablet, Rfl: 3    fluticasone  propionate HFA 110 MCG/ACT inhaler, Inhale 1 puff by mouth every 12 hours., Disp: 12 g, Rfl: 3    glucometer (OneTouch Verio Flex System) w/Device kit, Use to check blood sugar 4 times a day., Disp: 1 kit, Rfl: 6    glucose 4 g chewable tablet, Chew and swallow 4 tablets (16 g) by mouth every 15 minutes as needed for low blood  sugar. Recheck blood sugar as needed., Disp: 50 tablet, Rfl: 3    ketoconazole  2 % shampoo, apply topically face and scalp if needed, Disp: 120 mL, Rfl: 1    loratadine  10 MG tablet, Take 1 tablet (10 mg) by mouth daily., Disp: 30 tablet, Rfl: 5    tadalafil  5 MG tablet, Take 1 tablet (5 mg) by mouth daily as needed for erectile dysfunction., Disp: 30 tablet, Rfl: 0     PAST MEDICAL HISTORY:  Prostate cancer as above   VTE (prior PE on anticoagulation)   TII DM (on trulicity , metformin )  Gout   Asthma, mild intermittent   Hyperlipidemia   OA     SOCIAL HISTORY:  Lives in [place]   Enjoys fishing   Friends and family nearby     PHYSICAL EXAMINATION:  VITAL SIGNS:   Vitals:    02/24/24 1547   BP: 135/86   Pulse: (!) 43   Resp: 18   Temp:  36.5 C   SpO2: 100%     GENERAL: Comfortable appearing, very pleasant man in no acute distress  HEENT: Sclerae anicteric.   CHEST: Normal work of breathing  NEURO: Appropriate gait. Moving all extremities spontaneously   MSK: Normal bulk and tone in all extremities  SKIN: No apparent rashes or lesions    ECOG: 1    LABORATORY STUDIES:  CBC:  Lab Results   Component Value Date/Time    WBC 4.60 02/24/2024 02:08 PM    HEMOGLOBIN 12.0 (L) 02/24/2024 02:08 PM    HEMATOCRIT 37 (L) 02/24/2024 02:08 PM    PLATELET 188 02/24/2024 02:08 PM     Metabolic panel:  Lab Results   Component Value Date/Time    POTASSIUM 4.4 02/24/2024 02:08 PM    CL 109 (H) 02/24/2024 02:08 PM    CO2 32 02/24/2024 02:08 PM    BUN 20 02/24/2024 02:08 PM    CREATININE 1.57 (H) 02/24/2024 02:08 PM    AST 21 02/24/2024 02:08 PM    ALT 20 02/24/2024 02:08 PM    ALK 93 02/24/2024 02:08 PM    BILIRUBN 0.6 02/24/2024 02:08 PM    ALBUMIN 4.1 02/24/2024 02:08 PM    PROTEIN 6.8 02/24/2024 02:08 PM     Tumor Markers:  Lab Results   Component Value Date/Time    PSA <0.03 02/24/2024 02:08 PM    PSA 0.03 12/02/2023 12:53 PM    PSA 0.03 09/11/2023 01:06 PM    PSA 0.03 07/14/2023 03:40 PM    PSA 0.03 06/24/2023 03:08 PM    PSA <0.03 05/13/2023 03:25 PM    PSA <0.03 03/12/2023 12:12 PM    PSA <0.03 12/01/2022 03:18 PM    PSA 0.18 08/21/2022 03:11 PM    PSA 28.33 (H) 01/09/2022 10:50 AM     ASSESSMENT:  Nathan [name] is a 73 year old male with new diagnosis of high risk localized (gleason 4+3=7, gg3, at least T3a, PSA 28.3) s/p SBRT to the prostate, SV, and pelvic lymph nodes completed on 06/30/2022 in combination with ADT, here today for routine follow up and lupron .     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 Is now s/p definitive radiation to the prostate, SV, and pelvic lymph nodes, in combination with ADT. Notably,  he did not meet STAMPEDE criteria to add abi, as he does not have lymph node  positive disease, and only meets 1/3 criteria (T3b disease, PSA >40, or gg4+ disease.). Received lupron  shot, and today's shot will be the last to complete 2 years of hormone therapy.  -Lupron  22.5mg , last dose today. Will move on to surveillance.    Erectile dysfunction   Prescribed tadalafil , he has picked this up but has not used it.     Genetics   Meets criteria for germline genetic testing given high risk localized disease, not yet complete     FOLLOW UP:  3 months for labs and follow up.

## 2024-02-24 NOTE — Progress Notes (Signed)
 Oncology Infusion Nurse Note    Subjective / Visit Information:    Reason for Visit: Immunization or Injection      Objective:   There were no vitals filed for this visit.    Medication Administrations This Visit         leuprolide  (3 month) (Lupron  Depot) injection 22.5 mg Admin Date  02/24/2024  15:58 Action  Given Dose  22.5 mg Route  Intramuscular Site  Left Ventrogluteal Documented By  Macayla Ekdahl, RN    Ordering Provider: Fernand Wynell HERO, MD    [facility]: [phone]            Assessment and Plan:            Hand off: No    Discharge:   Patient tolerated treatment well and without complications.  Discharge disposition:   Accompanied by: Self  Discharged to: Provider  Response: Aware of existing appointments, Aware of clinic contact information, States understanding of plan, and No further questions    Summary: Pt arrived ambulatory for a Lupron  injection.  No new concerns to note.  Pt was dc'd in stable condition to provider visit.       Additional documentation may exist in Flowsheets or Education Activity

## 2024-03-28 ENCOUNTER — Other Ambulatory Visit (HOSPITAL_BASED_OUTPATIENT_CLINIC_OR_DEPARTMENT_OTHER): Payer: Self-pay | Admitting: Internal Medicine

## 2024-03-28 DIAGNOSIS — E1165 Type 2 diabetes mellitus with hyperglycemia: Secondary | ICD-10-CM

## 2024-03-30 ENCOUNTER — Telehealth (HOSPITAL_BASED_OUTPATIENT_CLINIC_OR_DEPARTMENT_OTHER): Payer: Self-pay | Admitting: Internal Medicine

## 2024-03-30 MED ORDER — TRULICITY 0.75 MG/0.5ML SC SOAJ
0.7500 mg | SUBCUTANEOUS | 5 refills | Status: AC
Start: 2024-03-30 — End: ?

## 2024-03-30 NOTE — Telephone Encounter (Signed)
 Reordered today 03/30/24 to [facility].

## 2024-03-30 NOTE — Telephone Encounter (Signed)
 Refill request received electronically on 03/28/24.  Authorized in that encounter.

## 2024-03-30 NOTE — Telephone Encounter (Signed)
 I received incoming call from patient. Patient went to [facility] pharmacy to pick up dulaglutide  (Trulicity ) 0.75 MG/0.5ML auto-injector yesterday but the pharmacy said they hadn't received a prescription. I have checked the prescription and saw that it was sent to the correct pharmacy with 3 refills. I told patient I would inquired Dr. Conan and Refill Authorization Center to look into his prescription.    I scheduled patient for follow up visit with Dr. Conan on 9/17.    Note forwarded to Dr. Conan and Refill Authorization Center.

## 2024-03-30 NOTE — Telephone Encounter (Signed)
 Return Call: Voicemail - Detailed Message    Subject: Refill Request     NAME OF MEDICATION(S):   dulaglutide  (Trulicity ) 0.75 MG/0.5ML auto-injector    DATE NEEDED BY: 04/04/2024  PRESCRIBING PROVIDER:   Authorizing Provider: Conan JINNY Ade, MD NPI: [id number]   DEA #: [id number]   Authorizing Specialties: Internal Medicine    Ordering User: Cindy Query Loris, RPh      PHARMACY NAME/LOCATION:   [id number] [place] [phone] [id number]  [address] [place] [address]  Phone: [phone]  Fax: [phone]     ADDITIONAL INFORMATION: Patient has 1 needle/pen left.

## 2024-04-06 ENCOUNTER — Other Ambulatory Visit (HOSPITAL_BASED_OUTPATIENT_CLINIC_OR_DEPARTMENT_OTHER): Payer: Self-pay | Admitting: Internal Medicine

## 2024-04-06 ENCOUNTER — Ambulatory Visit: Attending: Internal Medicine | Admitting: Internal Medicine

## 2024-04-06 VITALS — BP 121/75 | HR 62 | Temp 95.5°F

## 2024-04-06 DIAGNOSIS — Z7189 Other specified counseling: Secondary | ICD-10-CM | POA: Insufficient documentation

## 2024-04-06 DIAGNOSIS — M79601 Pain in right arm: Secondary | ICD-10-CM

## 2024-04-06 DIAGNOSIS — N1831 Chronic kidney disease, stage 3a: Secondary | ICD-10-CM

## 2024-04-06 DIAGNOSIS — N529 Male erectile dysfunction, unspecified: Secondary | ICD-10-CM

## 2024-04-06 DIAGNOSIS — Z86711 Personal history of pulmonary embolism: Secondary | ICD-10-CM

## 2024-04-06 DIAGNOSIS — E1122 Type 2 diabetes mellitus with diabetic chronic kidney disease: Secondary | ICD-10-CM

## 2024-04-06 DIAGNOSIS — Z0000 Encounter for general adult medical examination without abnormal findings: Secondary | ICD-10-CM

## 2024-04-06 DIAGNOSIS — J45909 Unspecified asthma, uncomplicated: Secondary | ICD-10-CM

## 2024-04-06 DIAGNOSIS — Z7984 Long term (current) use of oral hypoglycemic drugs: Secondary | ICD-10-CM

## 2024-04-06 DIAGNOSIS — E1165 Type 2 diabetes mellitus with hyperglycemia: Secondary | ICD-10-CM

## 2024-04-06 DIAGNOSIS — Z794 Long term (current) use of insulin: Secondary | ICD-10-CM | POA: Insufficient documentation

## 2024-04-06 DIAGNOSIS — Z8546 Personal history of malignant neoplasm of prostate: Secondary | ICD-10-CM

## 2024-04-06 DIAGNOSIS — Z1159 Encounter for screening for other viral diseases: Secondary | ICD-10-CM | POA: Insufficient documentation

## 2024-04-06 DIAGNOSIS — Z87891 Personal history of nicotine dependence: Secondary | ICD-10-CM

## 2024-04-06 DIAGNOSIS — Z7985 Long-term (current) use of injectable non-insulin antidiabetic drugs: Secondary | ICD-10-CM

## 2024-04-06 LAB — COMPREHENSIVE METABOLIC PANEL
ALT (GPT): 17 U/L (ref 10–48)
AST (GOT): 17 U/L (ref 9–38)
Albumin: 4.4 g/dL (ref 3.5–5.2)
Alkaline Phosphatase (Total): 104 U/L (ref 36–161)
Anion Gap: 7 (ref 4–12)
Bilirubin (Total): 0.7 mg/dL (ref 0.2–1.3)
Calcium: 9.7 mg/dL (ref 8.9–10.2)
Carbon Dioxide, Total: 30 meq/L (ref 22–32)
Chloride: 105 meq/L (ref 98–108)
Creatinine: 1.78 mg/dL — ABNORMAL HIGH (ref 0.51–1.18)
Glucose: 96 mg/dL (ref 62–125)
Potassium: 4 meq/L (ref 3.6–5.2)
Protein (Total): 7.4 g/dL (ref 6.0–8.2)
Sodium: 142 meq/L (ref 135–145)
Urea Nitrogen: 20 mg/dL (ref 8–21)
eGFR by CKD-EPI 2021: 40 mL/min/1.73_m2 — ABNORMAL LOW (ref >59)

## 2024-04-06 LAB — ALBUMIN/CREATININE RATIO, RANDOM URINE
Albumin (Micro), URN: 8.39 mg/dL
Albumin/Creatinine Ratio, URN: 35 mg/g{creat} — ABNORMAL HIGH (ref <30)
Creatinine/Unit, URN: 240 mg/dL

## 2024-04-06 LAB — LIPID PANEL
Cholesterol/HDL Ratio: 3.1
HDL Cholesterol: 54 mg/dL (ref >39)
LDL Cholesterol, NIH Equation: 94 mg/dL (ref <130)
Non-HDL Cholesterol: 112 mg/dL (ref 0–159)
Total Cholesterol: 166 mg/dL (ref <200)
Triglyceride: 101 mg/dL (ref <150)

## 2024-04-06 NOTE — Progress Notes (Signed)
 [facility] - Outpatient Return Clinic Note    Date:  04/06/2024    Nathan [name] is a 73 year old male who is here today for   Chief Complaint   Patient presents with    Follow-Up      INTERVAL HISTORY/HPI:    [name] is a 73 year old man hospitalized 4/14 - 11/05/20 for syncope, PE, NSTEMI, Hyoxic resp failure, HHS, Newly diagnosed DM, Asthma, AKI/CKD, and gout.      Nathan [name] returns for follow-up.     Nathan [name] reports that he has been told that his CGM will not be covered by insurance.  I will inquire with pharmacy.     He reports that he has a sore right arm, mostly located on his forearm.  No shoulder pain.  No known injuries.  He does report leaning on the arm while fishing [place] salmon [place] dock.     He reports that he is completing his treatment with Lupron .      He confirmed that he is taking Eliquis .        SH:  He has been staying at home.  He lives at home alone, likes to fish in the warmer months.  He states he believes in [place], and lives on a fixed income.  He likes to speak when spoken to.     PROBLEM LIST: See Epic Problem List    Patient Active Problem List   Diagnosis    Obesity, unspecified    Generalized osteoarthrosis, unspecified site    Other hyperlipidemia    Presbyopia    Gout    Irregular heart beat    Achilles tendinosis of right lower extremity    Mild intermittent asthma without complication (HCC)    Non-seasonal allergic rhinitis    Subacute massive pulmonary embolism (HCC)    Type 2 diabetes mellitus with hyperglycemia, with long-term current use of insulin  (HCC)    Syncope    PSA elevation    Prostate cancer (HCC)    Cardiac risk counselling     MEDICATIONS:    Current Outpatient Medications:     albuterol  HFA 108 (90 Base) MCG/ACT inhaler, Inhale 2 puffs by mouth every 6 hours as needed for shortness of breath/wheezing., Disp: 6.7 g, Rfl: 3    allopurinol  100 MG tablet, Take 2 tablets (200 mg) by mouth daily., Disp: 180 tablet, Rfl: 3    apixaban  (Eliquis ) 2.5 MG  tablet, Take 1 tablet (2.5 mg) by mouth 2 times a day., Disp: 180 tablet, Rfl: 0    atorvastatin  40 MG tablet, Take 1 tablet (40 mg) by mouth every evening. To lower cholesterol., Disp: 90 tablet, Rfl: 3    blood glucose test strip, Use up to 1 strip daily to check blood sugar., Disp: 100 strip, Rfl: 1    dulaglutide  (Trulicity ) 0.75 MG/0.5ML auto-injector, Inject 0.75 mg under the skin one time a week., Disp: 2 mL, Rfl: 5    enalapril  5 MG tablet, Take 0.5 tablets (2.5 mg) by mouth daily., Disp: 45 tablet, Rfl: 3    fluticasone  propionate HFA 110 MCG/ACT inhaler, Inhale 1 puff by mouth every 12 hours., Disp: 12 g, Rfl: 3    glucometer (OneTouch Verio Flex System) w/Device kit, Use to check blood sugar 4 times a day., Disp: 1 kit, Rfl: 6    glucose 4 g chewable tablet, Chew and swallow 4 tablets (16 g) by mouth every 15 minutes as needed for low blood sugar. Recheck  blood sugar as needed., Disp: 50 tablet, Rfl: 3    ketoconazole  2 % shampoo, apply topically face and scalp if needed, Disp: 120 mL, Rfl: 1    loratadine  10 MG tablet, Take 1 tablet (10 mg) by mouth daily., Disp: 30 tablet, Rfl: 5    tadalafil  5 MG tablet, Take 1 tablet (5 mg) by mouth daily as needed for erectile dysfunction., Disp: 30 tablet, Rfl: 0       ALLERGIES:  Review of patient's allergies indicates:  No Known Allergies    PHYSICAL EXAM:    BP 121/75   Pulse 62   Temp (!) 35.3 C   SpO2 96%     NAD  Alert and interactive  Lungs clear  COR intermittent ectopy w/o murmur  Abd:  NT  Ext w/o edema    Right UE:  No shoulder ROM limitations, no weakness, no signs of rotator cuff tendonopathy.   Strength UE 5/5 bilat prox and distal    Right forearm over lateral surfaces mildly tender to palpation, no bruising.  No joint tenderness in the elbow or wrist.    No cubital or carpal tunnel tenderness with palpation or Tinel's.        LAB AND IMAGING RESULTS:  Lab on 02/24/24   1. CBC   Result Value Ref Range    WBC 4.60 4.3 - 10.0 10*3/uL    RBC 4.13 (L)  4.40 - 5.60 10*6/uL    Hemoglobin 12.0 (L) 13.0 - 18.0 g/dL    Hematocrit 37 (L) 61.9 - 50.0 %    MCV 89 81 - 98 fL    MCH 29.1 27.3 - 33.6 pg    MCHC 32.5 32.2 - 36.5 g/dL    Platelet Count 811 849 - 400 10*3/uL    RDW-CV 14.3 11.0 - 14.5 %   2. Comprehensive Metabolic Panel   Result Value Ref Range    Sodium 143 135 - 145 meq/L    Potassium 4.4 3.6 - 5.2 meq/L    Chloride 109 (H) 98 - 108 meq/L    Carbon Dioxide, Total 32 22 - 32 meq/L    Anion Gap 2 (L) 4 - 12    Glucose 94 62 - 125 mg/dL    Urea Nitrogen 20 8 - 21 mg/dL    Creatinine 8.42 (H) 0.51 - 1.18 mg/dL    Protein (Total) 6.8 6.0 - 8.2 g/dL    Albumin 4.1 3.5 - 5.2 g/dL    Bilirubin (Total) 0.6 0.2 - 1.3 mg/dL    Calcium  9.3 8.9 - 10.2 mg/dL    AST (GOT) 21 9 - 38 U/L    Alkaline Phosphatase (Total) 93 36 - 161 U/L    ALT (GPT) 20 10 - 48 U/L    eGFR by CKD-EPI 2021 47 (L) >59 mL/min/1.73_m2   3. Prostate Specific Antigen   Result Value Ref Range    PSA, Diagnostic/Monitoring <0.03 0.00 - 4.00 ng/mL       ASSESSMENT AND PLAN:    1. Diabetes:  Dulaglutide  = Trulicity  0.75 mg sc q wk and metformin  500 mg bid.      Foot Exam - 11/26/20, 07/04/21, 11/25/21, 09/01/22, completed.   Ophthal Exam:  11/21/22:  DM2 without retinopathy - recommend annual DFE.  Mixed type cataracts, both eyes, mild. Referral placed for annual eye visit. (11/18/23)  Alb/Creat = 7 (11/25/21)      Repeat labs    A1C UWM AMB     A1C  Latest Ref Rng 4.0 - 5.6 %   06/09/2022 6.0    12/01/2022 5.4    07/14/2023 5.7 (H)       2.  Right arm pain.  So significant findings to suggest shoulder, rotator cuff, or elbow / wrist process.  This appears to be related to repetitive use or direct pressure on that part of the arm with fishing.  He concurs that his is likely and will attempt to make adjustments.      3. 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  He is currently taking 2.5 mg daily.       4.  CT  Chest:  11/01/20:  Main pulmonary artery measures 3.3 cm suggestive of pulmonary hypertension.   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       5. Cardiac Function.  08/06/21:  TTE Conclusion:  Normal LV size with mildly reduced systolic function. Regional wall motion abnormalities shown below.  Normal RV size and systolic function. Normal RA pressure. Could not evaluate PA pressure. RV/LV ratio 0.7 which is normal.  Normal valve structure and function.  Compared with prior study from 4/14//22, RV size and systolic function has improved.     6. Asthma.  He uses -Fluticasone  110 mcg BID  -Albuterol  Q6hr PRN and has a stable pattern. PFTs (08/29/22):  FEV1/FVC 88%, FEVI 64% predicted.  Interpretation:  Narrative & Impression: No obstructive disorder. Spirometry suggests a possible mild restrictive disorder.        7. HTN. Enalapril  2.5 mg daily.      Vitals     Systolic Diastolic   12/02/2023 118  72    02/24/2024 135  86    04/06/2024 121  75         8. Cardiac Risk Factors.  BP 124/62 off meds, BMI 47, LDL 96, DM no, No FH CAD, No tobacco. No MI or Stroke.  EKG (11/01/21): NSR, Axis - 52 (LAD), PR N, QRS 168, QTc 459, RBBB, LAHB, No Q-waves. ASCVD Risk 9.8%.  Atorvastatin  40 mg qd.   Repeat labs.        Cholesterol     LDL Cholesterol   Latest Ref Rng <130 mg/dL   89/68/7977 62    88/79/7976 74    07/14/2023 88       The 10-year ASCVD risk score (Arnett DK, et al., 2019) is: 27.1%    Values used to calculate the score:      Age: 62 years      Sex: Male      Is Non-Hispanic African American: Yes      Diabetic: Yes      Tobacco smoker: No      Systolic Blood Pressure: 118 mmHg      Is BP treated: Yes      HDL Cholesterol: 59 mg/dL      Total Cholesterol: 161 mg/dL        9. CKD (Grade 3a).  Stable pattern over time.     Prot / Creat < 0.1 (02/22/16)   Alb/Creat 7 (05/20/21), 4 (12/01/22)  GFR 54 (12/01/22), 49 (07/14/23) Stage 3a.         Renal Function Testing     Creatinine   Latest Ref Rng 0.51 - 1.18 mg/dL    01/25/7974 8.14 (H)    12/01/2022 1.41 (H)    03/12/2023 1.74 (H)    06/24/2023 1.50 (  H)          10.  Prostate Cancer:  High risk localized (gleason 4+3=7, gg3, at least T3a, PSA 28.3) s/p SBRT to the prostate, SV, and pelvic lymph nodes completed on 06/30/2022 in combination with ADT.  He is completing ADT therapy.         Latest Reference Range & Units 06/24/23 15:08 07/14/23 15:40 09/11/23 13:06   Prostate Specific Antigen 0.00 - 4.00 ng/mL 0.03 0.03 0.03         11.  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  Viagra and Cialis  have been recommended.      12. Chronic gout.  He report intermittent foot pain but is non-specific on interview.  Records indicate last flare podagra (L great toe) in June 2017.  He is minimizing red meat, and no alcohol.  He has CKD therefore goal is to use tylenol  over NSAIDs for pain.  He is on Allopurinol  200 mg daily, Colchicine  0.6 mg 2 tabs for flare, then 1 tab 1 hr later.  Uric Acid 7.4 (07/07/18).  CrCl at 50 mL/min, allopurinol  and colchicine  doses acceptable.  ASA has been stopped due to gout and CKD.  (stable 03/09/23)     13.  Social:  He has no DPOA.  But his brother Oneil who lives in [place] calls and checks on him the most.  He would want Oneil involved if her were unable to make decisions for himself.         HEALTH CARE MAINTANENCE / SCREENING:  (See below)      PHQ 2 Screening:         PHQ-9     PHQ2 Total Score   07/11/2019 0    04/23/2022 0    06/30/2022 0       Colorectal Cancer Screening:  FIT negative (07/23/18 and 07/10/21). Patient declines colonoscopy.   SDM, he states he does not desire a colonoscopy in the future.  (11/18/23)       Latest Reference Range & Units 07/23/18 22:18 07/26/19 01:00 07/10/21 23:55 10/08/22 01:46 08/04/23 22:14   Occult Bld 1 Result NRN  Negative Negative Negative Negative Negative      Tobacco Use:  Quit at age 63.  ETOH Use:  Occ use   Marijuana:  Occ      Hepatitis C / HIV Screening:    HIV negative 2013.   HIV  Hep C (1945-65 + high risk):  Neg 2013.  Repeat   Hep B testing.  He accepts this today.       AAA screening.  02/14/22: CT ABD w/o contrast: Vasculature: Scattered atherosclerosis without aneurysm.      Pneumococcal:   IF Age > 65.          1) If PPSV23 Only, PCV20 > 1 yr;  2)  If Both PPSV23/PCV13, PCV20 after 5 yrs after last pnx (SDM if PPSV23 last given was > 65); 3) If NO prior pnx - Give PCV20: Due for PCV20. - declines   07/14/23  Shingrix (> Age 59, +/- Zostavax or shingles) - 2 doses:  Will discuss with patient. Declines 07/14/23   Tdap, then Td every 10 yrs: Tdap 2012, due for Td.  Declines 07/14/23.    Influenza: declines 07/14/23, discussed 04/06/24  COVID: declines 07/14/23, discussed 04/06/24.     Immunization History   Administered Date(s) Administered    COVID-19 Moderna mRNA monovalent 12 yrs and older 09/20/2019, 10/18/2019    COVID-19 Pfizer mRNA bivalent  12 yrs and older 07/04/2021    COVID-19 Pfizer mRNA monovalent tris-sucrose 12 yrs and older 11/26/2020    Hepatitis B, unspecified 10/05/2007, 11/05/2007, 05/08/2008    Influenza quadrivalent PF 04/19/2013, 06/12/2014, 05/01/2015    Influenza quadrivalent adjuvanted (Fluad) 05/19/2019    Influenza trivalent PF 07/07/2012    Influenza, unspecified 09/26/2010    Pneumococcal polysaccharide PPSV23 (Pneumovax 23) 02/12/2009    Tdap 01/21/2011   Pended Date(s) Pended    Pneumococcal conjugate PCV20 (Prevnar 20) 03/11/2023     F/U Dr. Conan in 4 months.    Total time spent in the direct care of this patient and in reviewing records and documentation on the date of service was 30 minutes.

## 2024-04-07 LAB — HEPATITIS B BATTERY (HBSAG W/RFLX PCR, ANTI-HBS, ANIT-HBC)
Hepatitis B Core Ab: NONREACTIVE
Hepatitis B Surface Antibody Intl Units: <8 m[IU]/mL
Hepatitis B Surface Antibody: NONREACTIVE
Hepatitis B Surface Antigen w/Reflex: NONREACTIVE

## 2024-04-07 LAB — HIV ANTIGEN AND ANTIBODY SCRN
HIV Antigen and Antibody Interpretation: NONREACTIVE
HIV Antigen and Antibody Result: NONREACTIVE

## 2024-04-07 LAB — HEPATITIS C AB WITH REFLEX PCR: Hepatitis C Antibody w/Rflx PCR: NONREACTIVE

## 2024-04-07 LAB — HEMOGLOBIN A1C, HPLC: Hemoglobin A1C: 6.3 % — ABNORMAL HIGH (ref 4.0–5.6)

## 2024-04-08 NOTE — Progress Notes (Signed)
 04/06/24.   LDL - 94, A1c 6.3%, Cr 1.8, GFR = 40, ACR 35, HIV NR, Hep C NR, Hep B NR.    Orders Only on 04/06/24   1. Lipid Panel   Result Value Ref Range    Total Cholesterol 166 <200 mg/dL    Triglyceride 898 <849 mg/dL    HDL Cholesterol 54 >60 mg/dL    Non-HDL Cholesterol 112 0 - 159 mg/dL    LDL Cholesterol, NIH Equation 94 <130 mg/dL    Cholesterol/HDL Ratio 3.1     Lipid Panel, Additional Info. (NOTE)    2. Hemoglobin A1C, HPLC   Result Value Ref Range    Hemoglobin A1C 6.3 (H) 4.0 - 5.6 %   3. Comprehensive Metabolic Panel   Result Value Ref Range    Sodium 142 135 - 145 meq/L    Potassium 4.0 3.6 - 5.2 meq/L    Chloride 105 98 - 108 meq/L    Carbon Dioxide, Total 30 22 - 32 meq/L    Anion Gap 7 4 - 12    Glucose 96 62 - 125 mg/dL    Urea Nitrogen 20 8 - 21 mg/dL    Creatinine 8.21 (H) 0.51 - 1.18 mg/dL    Protein (Total) 7.4 6.0 - 8.2 g/dL    Albumin 4.4 3.5 - 5.2 g/dL    Bilirubin (Total) 0.7 0.2 - 1.3 mg/dL    Calcium  9.7 8.9 - 10.2 mg/dL    AST (GOT) 17 9 - 38 U/L    Alkaline Phosphatase (Total) 104 36 - 161 U/L    ALT (GPT) 17 10 - 48 U/L    eGFR by CKD-EPI 2021 40 (L) >59 mL/min/1.73_m2   4. HIV Antigen and Antibody Screen   Result Value Ref Range    HIV Antigen and Antibody Result Nonreactive NREAC    HIV Antigen and Antibody Interpretation       Nonreactive. No evidence of infection with HIV-1 or HIV-2.   5. Hepatitis C Antibody w/Reflex PCR   Result Value Ref Range    Hepatitis C Antibody w/Rflx PCR Nonreactive NREAC    Hepatitis C Antibody Interpretation       No evidence of antibody to hepatitis C virus (HCV).  Anti-HCV may develop slowly (6 weeks - 6 months) over the course of hepatitis C virus infection.  Rarely it may be absent in cases of chronic   infection.  Anti-HCV may also disappear after recovery from acute, self-limited disease.     6. Hepatitis B Battery   Result Value Ref Range    Hepatitis B Surface Antigen w/Reflex Nonreactive NREAC    Hepatitis B Surface Antibody Nonreactive      Hepatitis B Surface Antibody Intl Units <8.00 m[IU]/mL    Hepatitis B Core Ab Nonreactive NREAC    Hepatitis B Battery Interp       No evidence of past or present Hepatitis B infection.   7. Albumin/Creatinine Ratio, Random Urine   Result Value Ref Range    Creatinine/Unit, URN 240 mg/dL    Albumin (Micro), URN 8.39 mg/dL    Albumin/Creatinine Ratio, URN 35 (H) <30 mg/g[creat]

## 2024-05-18 ENCOUNTER — Other Ambulatory Visit (HOSPITAL_BASED_OUTPATIENT_CLINIC_OR_DEPARTMENT_OTHER): Payer: Self-pay | Admitting: Internal Medicine

## 2024-05-18 DIAGNOSIS — I2699 Other pulmonary embolism without acute cor pulmonale: Secondary | ICD-10-CM

## 2024-05-19 MED ORDER — ELIQUIS 2.5 MG OR TABS
2.5000 mg | ORAL_TABLET | Freq: Two times a day (BID) | ORAL | 1 refills | Status: AC
Start: 2024-05-19 — End: ?

## 2024-05-19 NOTE — Telephone Encounter (Signed)
 Medication Dispenses for Apixaban     From 05/20/2023 to 05/18/2024   Dispensed Days Supply Quantity Provider Pharmacy   ELIQUIS  2.5 MG TAB B-MS 02/19/2024 90 180 each Conan JINNY Ade, MD [facility] [phone] 27-19...   ELIQUIS  2.5 MG TAB B-MS 11/13/2023 90 180 each Conan JINNY Ade, MD [facility] [phone] 27-19...   ELIQUIS  2.5 MG TAB B-MS 10/17/2023 30 60 each Conan JINNY Ade, MD [facility] [phone] 27-19...   ELIQUIS  2.5 MG TAB B-MS 09/16/2023 30 60 each Conan JINNY Ade, MD [facility] [phone] 27-19...   ELIQUIS  2.5 MG TAB B-MS 08/16/2023 30 60 each Conan JINNY Ade, MD [facility] [phone] 27-19...   ELIQUIS  2.5 MG TAB B-MS 07/16/2023 30 60 each Conan JINNY Ade, MD [facility] [phone] 27-19.[email or web address]

## 2024-06-08 ENCOUNTER — Ambulatory Visit (HOSPITAL_BASED_OUTPATIENT_CLINIC_OR_DEPARTMENT_OTHER)

## 2024-06-08 ENCOUNTER — Ambulatory Visit (HOSPITAL_BASED_OUTPATIENT_CLINIC_OR_DEPARTMENT_OTHER): Admitting: Student in an Organized Health Care Education/Training Program

## 2024-06-15 ENCOUNTER — Ambulatory Visit: Attending: Student in an Organized Health Care Education/Training Program

## 2024-06-15 ENCOUNTER — Ambulatory Visit (HOSPITAL_BASED_OUTPATIENT_CLINIC_OR_DEPARTMENT_OTHER): Admitting: Student in an Organized Health Care Education/Training Program

## 2024-06-15 DIAGNOSIS — C61 Malignant neoplasm of prostate: Secondary | ICD-10-CM | POA: Insufficient documentation

## 2024-06-15 DIAGNOSIS — N529 Male erectile dysfunction, unspecified: Secondary | ICD-10-CM | POA: Insufficient documentation

## 2024-06-15 LAB — PSA, DIAGNOSTIC/MONITORING: PSA, Diagnostic/Monitoring: <0.03 ng/mL (ref 0.00–4.00)

## 2024-06-15 NOTE — Progress Notes (Signed)
 [facility]  GENITOURINARY ONCOLOGY CLINIC NOTE    ONCOLOGY CARE TEAM:  Patient Care Team:  Ina Gladstone Beans, MD as Surgical Oncologist (Urology)  Honor Lunger, MD as Radiation Oncologist (Radiation Oncology)  Fernand Wynell HERO, MD as Oncologist (Hematology-Oncology)    IDENTIFICATION/CC:  Nathan [name] is a 73 year old male with new diagnosis of high risk localized (gleason 4+3=7, gg3, at least T3a, PSA 28.3) s/p SBRT to the prostate, SV, and pelvic lymph nodes completed on 06/30/2022 in combination with ADT (last 82m lupron  02/24/2024). Here today for first surveillance visit.     CURRENT TREATMENT:  ADT since 05/26/2022  S/p SBRT to prostate, SV, and lymph nodes completed 06/30/2022    TREATMENT HISTORY:  Oncology History Overview   07/08/2021: PSA 28.19  11/25/2021: PSA 23.06  01/09/2022: PSA 28.33  01/24/2022: Prostate biopsy with gleason 4+3=7 prostate cancer in 11/12 cores, 30% pattern 4  02/14/2022: CT abdomen/pelvis shows sclerotic lesions in the pelvis perhaps concerning for prostate cancer  03/27/2023: PSMA PET shows tracer uptake in the PZ from apex to bilateral mid and L base extending to SV. No PSMA evident nodal or osseous met  05/26/2022: MRI Prostate with PIRADS 5 lesion, no macroscopic ECE, likely L SV invasion, no lympadenopathy or bone lesions. Starts ADT with lupron  22.5mg  q3 months  06/18/2022 -06/30/2022: SBRT to the prostate, SV, and pelvic lymph nodes + ADT, 4200cGy in 5 fractions   08/21/2022: PSA 0.18  12/01/2022-Present: PSA undetectable   02/2024: Last 3 month shot of lupron   05/2024-Present: Surveillance        HPI:  Nathan [name] presents today for first surveillance visit since completing lupron . He has been doing well, and does not notice much difference in his symptoms since stopping lupron .     REVIEW OF SYSTEMS:  A complete review of systems was completed and negative unless documented above in HPI    HOME MEDICATIONS:    Current Outpatient Medications:     albuterol  HFA 108  (90 Base) MCG/ACT inhaler, Inhale 2 puffs by mouth every 6 hours as needed for shortness of breath/wheezing., Disp: 6.7 g, Rfl: 3    allopurinol  100 MG tablet, Take 2 tablets (200 mg) by mouth daily., Disp: 180 tablet, Rfl: 3    apixaban  (Eliquis ) 2.5 MG tablet, Take 1 tablet (2.5 mg) by mouth 2 times a day., Disp: 180 tablet, Rfl: 1    atorvastatin  40 MG tablet, Take 1 tablet (40 mg) by mouth every evening. To lower cholesterol., Disp: 90 tablet, Rfl: 3    blood glucose test strip, Use up to 1 strip daily to check blood sugar., Disp: 100 strip, Rfl: 1    dulaglutide  (Trulicity ) 0.75 MG/0.5ML auto-injector, Inject 0.75 mg under the skin one time a week., Disp: 2 mL, Rfl: 5    enalapril  5 MG tablet, Take 0.5 tablets (2.5 mg) by mouth daily., Disp: 45 tablet, Rfl: 3    fluticasone  propionate HFA 110 MCG/ACT inhaler, Inhale 1 puff by mouth every 12 hours., Disp: 12 g, Rfl: 3    glucometer (OneTouch Verio Flex System) w/Device kit, Use to check blood sugar 4 times a day., Disp: 1 kit, Rfl: 6    glucose 4 g chewable tablet, Chew and swallow 4 tablets (16 g) by mouth every 15 minutes as needed for low blood sugar. Recheck blood sugar as needed., Disp: 50 tablet, Rfl: 3    ketoconazole  2 % shampoo, apply topically face and scalp if needed, Disp: 120 mL,  Rfl: 1    loratadine  10 MG tablet, Take 1 tablet (10 mg) by mouth daily. (Patient not taking: Reported on 06/15/2024), Disp: 30 tablet, Rfl: 5    tadalafil  5 MG tablet, Take 1 tablet (5 mg) by mouth daily as needed for erectile dysfunction., Disp: 30 tablet, Rfl: 0     PAST MEDICAL HISTORY:  Prostate cancer as above   VTE (prior PE on anticoagulation)   TII DM (on trulicity , metformin )  Gout   Asthma, mild intermittent   Hyperlipidemia   OA     SOCIAL HISTORY:  Lives in [place]   Enjoys fishing   Friends and family nearby     PHYSICAL EXAMINATION:  VITAL SIGNS:   Vitals:    06/15/24 1512   BP: 115/80   Pulse: (!) 58   Resp: 14   Temp: 36.7 C   SpO2: 96%     GENERAL:  Comfortable appearing, very pleasant man in no acute distress  HEENT: Sclerae anicteric.   CHEST: Normal work of breathing  NEURO: Appropriate gait. Moving all extremities spontaneously   MSK: Normal bulk and tone in all extremities  SKIN: No apparent rashes or lesions    ECOG: 1    LABORATORY STUDIES:  CBC:  Lab Results   Component Value Date/Time    WBC 4.60 02/24/2024 02:08 PM    HEMOGLOBIN 12.0 (L) 02/24/2024 02:08 PM    HEMATOCRIT 37 (L) 02/24/2024 02:08 PM    PLATELET 188 02/24/2024 02:08 PM     Metabolic panel:  Lab Results   Component Value Date/Time    POTASSIUM 4.0 04/06/2024 04:49 PM    CL 105 04/06/2024 04:49 PM    CO2 30 04/06/2024 04:49 PM    BUN 20 04/06/2024 04:49 PM    CREATININE 1.78 (H) 04/06/2024 04:49 PM    AST 17 04/06/2024 04:49 PM    ALT 17 04/06/2024 04:49 PM    ALK 104 04/06/2024 04:49 PM    BILIRUBN 0.7 04/06/2024 04:49 PM    ALBUMIN 4.4 04/06/2024 04:49 PM    PROTEIN 7.4 04/06/2024 04:49 PM     Tumor Markers:  Lab Results   Component Value Date/Time    PSA <0.03 06/15/2024 02:15 PM    PSA <0.03 02/24/2024 02:08 PM    PSA 0.03 12/02/2023 12:53 PM    PSA 0.03 09/11/2023 01:06 PM    PSA 0.03 07/14/2023 03:40 PM    PSA 0.03 06/24/2023 03:08 PM    PSA <0.03 05/13/2023 03:25 PM    PSA <0.03 03/12/2023 12:12 PM    PSA <0.03 12/01/2022 03:18 PM    PSA 0.18 08/21/2022 03:11 PM     ASSESSMENT:  Nathan [name] is a 73 year old male with new diagnosis of high risk localized (gleason 4+3=7, gg3, at least T3a, PSA 28.3) s/p SBRT to the prostate, SV, and pelvic lymph nodes completed on 06/30/2022 in combination with ADT (last 17m lupron  02/24/2024). Here today for first surveillance visit.     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 Is now s/p definitive radiation to the prostate, SV, and pelvic lymph nodes, in combination with ADT. Notably, he did not meet  STAMPEDE criteria to add abi, as he does not have lymph node positive disease, and only meets 1/3 criteria (T3b disease, PSA >40, or gg4+ disease.). Now s/p definitive radiation to the prostate and  completed 2 years of ADT, now on surveillance. PSA undetectable   -Next surveillance visit in 3 months, with PSA and testosterone      Erectile dysfunction   Prescribed tadalafil , he has picked this up but has not used it. Hoping that ED improves off lupron     Genetics   Meets criteria for germline genetic testing given high risk localized disease, not yet complete     FOLLOW UP:  3 months for labs and follow up.

## 2024-06-24 ENCOUNTER — Telehealth (HOSPITAL_BASED_OUTPATIENT_CLINIC_OR_DEPARTMENT_OTHER): Payer: Self-pay

## 2024-06-24 NOTE — Telephone Encounter (Signed)
 Outreach: Texted via [facility]  Care gaps: Follow up visit with your PCP    Outcome: Sent text message to patient..    Closing encounter.

## 2024-07-04 ENCOUNTER — Other Ambulatory Visit (HOSPITAL_BASED_OUTPATIENT_CLINIC_OR_DEPARTMENT_OTHER): Payer: Self-pay | Admitting: Internal Medicine

## 2024-07-04 DIAGNOSIS — I10 Essential (primary) hypertension: Secondary | ICD-10-CM

## 2024-07-05 MED ORDER — ENALAPRIL MALEATE 5 MG OR TABS: 2.5000 mg | ORAL_TABLET | Freq: Every day | ORAL | 0 refills | Status: DC

## 2024-07-31 NOTE — Progress Notes (Unsigned)
 [facility] - Outpatient Return Clinic Note    Date:  08/01/2024    Nathan [name] is a 74 year old male who is here today for   Chief Complaint   Patient presents with    Diabetes     INTERVAL HISTORY/HPI:    [name] is a 74 year old man hospitalized 4/14 - 11/05/20 for syncope, PE, NSTEMI, Hyoxic resp failure, HHS, Newly diagnosed DM, Asthma, AKI/CKD, and gout.      Nathan [name] returns for follow-up.      He reports he is experiencing a mild cough.      He showed his CGM ranging from 80 - 140.        He reports that he is completing his treatment with Lupron .      He confirmed that he is taking Eliquis .        SH:  He has been staying at home.  He lives at home alone, likes to fish in the warmer months.  He states he believes in [place].  He lives on a fixed income.  He likes to speak when spoken to.     PROBLEM LIST: See Epic Problem List    Patient Active Problem List   Diagnosis    Obesity, unspecified    Generalized osteoarthrosis, unspecified site    Other hyperlipidemia    Presbyopia    Gout    Irregular heart beat    Achilles tendinosis of right lower extremity    Mild intermittent asthma without complication (HCC)    Non-seasonal allergic rhinitis    Subacute massive pulmonary embolism (HCC)    Type 2 diabetes mellitus with hyperglycemia, with long-term current use of insulin  (HCC)    Syncope    PSA elevation    Prostate cancer (HCC)    Cardiac risk counselling     MEDICATIONS:  Current Medications[1]       ALLERGIES:  Review of patient's allergies indicates:  No Known Allergies    PHYSICAL EXAM:    Temp 36.8 C (Temporal)   Resp 16   Ht 5' 6 (1.676 m)   Wt (!) 117 kg (258 lb)   SpO2 98% Comment: at RA, resting  BMI 41.64 kg/m     NAD  Alert and interactive  No evident cough  Lungs clear  COR intermittent ectopy w/o murmur  Abd:  NT  Ext: compressions stockings  Skin:  multiple skin tags on back and neck  Neuro:  no tremor, no drift, F-N intact  Gait intact    Foot exam:  08/01/24  wearing  compression stockings - (does not want to remove).   - Visual inspection: unable to visualize skin, non-tender to palpation.      - Monofilament exam:  light touch intact through stockings    - Pulse exam: DP's strong, symmetric through stockings      LAB AND IMAGING RESULTS:  Lab on 06/15/24   1. PSA, Diagnostic/Monitoring   Result Value Ref Range    PSA, Diagnostic/Monitoring <0.03 0.00 - 4.00 ng/mL     ASSESSMENT AND PLAN:    1. Diabetes:  Dulaglutide  = Trulicity  0.75 mg sc q wk.     Foot Exam - 08/01/24 (he asked not to take off his compession stockings.  Exam limited but good sensation to finger brushing, DP's palpable.  No reported lesions.  Repeat exam within 6 months.        Ophthal Exam:  11/21/22:  DM2 without retinopathy - recommend  annual DFE.  Mixed type cataracts, both eyes, mild. Referral placed for annual eye visit. (11/18/23), not completed.  Will re-submit.  08/01/24.      Alb/Creat = 35 (04/06/24)    2. HTN. Enalapril  2.5 mg daily.   Will ask to include in pharm visit.       3. Cardiac Risk Factors.  HTN, BMI 41, LDL 96, DM no, No FH CAD, No tobacco. No MI or Stroke.  EKG (11/01/21): NSR, Axis - 52 (LAD), PR N, QRS 168, QTc 459, RBBB, LAHB, No Q-waves. ASCVD Risk 9.8%.  Atorvastatin  40 mg qd.         4. 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  He is currently taking Apixaban  2.5 mg twice daily.       5.  CT Chest:  11/01/20:  Main pulmonary artery measures 3.3 cm suggestive of pulmonary hypertension.   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       6. Cardiac Function.  08/06/21:  TTE Conclusion:  Normal LV size with mildly reduced systolic function. Regional wall motion abnormalities shown below.  Normal RV size and systolic function. Normal RA pressure. Could not evaluate PA pressure. RV/LV ratio 0.7 which is normal.  Normal valve structure and function.  Compared with prior study  from 4/14//22, RV size and systolic function has improved.     7. Asthma.  He uses -Fluticasone  110 mcg BID  -Albuterol  Q6hr PRN and has a stable pattern. PFTs (08/29/22):  FEV1/FVC 88%, FEVI 64% predicted.  Interpretation:  Narrative & Impression: No obstructive disorder. Spirometry suggests a possible mild restrictive disorder.         8. CKD (Grade 3a).  GFR 50  Prot / Creat < 0.1 (02/22/16)   Alb/Creat 35 (04/06/24)    9.  Prostate Cancer:  High risk localized (gleason 4+3=7, gg3, at least T3a, PSA 28.3) s/p SBRT to the prostate, SV, and pelvic lymph nodes completed on 06/30/2022 in combination with ADT.  He has completed ADT therapy.         Latest Reference Range & Units 06/24/23 15:08 07/14/23 15:40 09/11/23 13:06   Prostate Specific Antigen 0.00 - 4.00 ng/mL 0.03 0.03 0.03      11.  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  Viagra and Cialis  have been recommended.      12. Chronic gout.  He report intermittent foot pain but is non-specific on interview.  Records indicate last flare podagra (L great toe) in June 2017.  He is minimizing red meat, and no alcohol.  He has CKD therefore goal is to use tylenol  over NSAIDs for pain.  He is on Allopurinol  200 mg daily, Colchicine  0.6 mg 2 tabs for flare, then 1 tab 1 hr later.  Uric Acid 7.4 (07/07/18).  CrCl at 50 mL/min, allopurinol  and colchicine  doses acceptable.  ASA has been stopped due to gout and CKD.  (stable 08/01/24)     13.  Right arm pain.  No current complaints.      14.  Multiple skin tags, he would like removed. Will set up for 4 weeks.     15.  Social:  He has no DPOA.  But his brother Nathan [name] who lives in [place] calls and checks on him the most.  He would want Nathan [name] involved if her were unable to make decisions for himself.  HEALTH CARE MAINTANENCE / SCREENING:  (See below)      PHQ 2 Screening:    PHQ2 = 0.       Colorectal Cancer Screening:  FIT negative (07/23/18 and 07/10/21). Patient declines colonoscopy.   SDM, he states he does not desire a  colonoscopy in the future.  (11/18/23).  FIT study ordered 08/01/24.        Latest Reference Range & Units 07/23/18 22:18 07/26/19 01:00 07/10/21 23:55 10/08/22 01:46 08/04/23 22:14   Occult Bld 1 Result NRN  Negative Negative Negative Negative Negative      Tobacco Use:  Quit at age 86.  ETOH Use:  Occ use   Marijuana:  Occ      Hepatitis C / HIV Screening:    HIV negative 2013, 04/06/24.   Hep C  NR 2013, 04/06/24  Hep B NR 04/06/24     AAA screening.  02/14/22: CT ABD w/o contrast: Vasculature: Scattered atherosclerosis without aneurysm.      Pneumococcal:   IF Age > 65.  1) If PPSV23 Only, PCV20 > 1 yr;  2)  If Both PPSV23/PCV13, PCV20 after 5 yrs after last pnx (SDM if PPSV23 last given was > 65); 3) If NO prior pnx - Give PCV20: Due for PCV20. - declines   07/14/23  Shingrix (> Age 96, +/- Zostavax or shingles) - 2 doses:  Will discuss with patient. Declines 07/14/23   Tdap, then Td every 10 yrs: Tdap 2012, due for Td.  Declines 07/14/23.    Influenza: declines 07/14/23, discussed 04/06/24, Declines 08/01/24  COVID: declines 07/14/23, discussed 04/06/24, declines 08/01/24   Hep B vaccination x 3, 2009    Immunization History   Administered Date(s) Administered    COVID-19 Moderna mRNA monovalent 12 yrs and older 09/20/2019, 10/18/2019    COVID-19 Pfizer mRNA bivalent 12 yrs and older 07/04/2021    COVID-19 Pfizer mRNA monovalent tris-sucrose 12 yrs and older 11/26/2020    Hepatitis B, unspecified 10/05/2007, 11/05/2007, 05/08/2008    Influenza quadrivalent PF 04/19/2013, 06/12/2014, 05/01/2015    Influenza quadrivalent adjuvanted (Fluad) 05/19/2019    Influenza trivalent PF 07/07/2012    Influenza, unspecified 09/26/2010    Pneumococcal polysaccharide PPSV23 (Pneumovax 23) 02/12/2009    Tdap 01/21/2011   Pended Date(s) Pended    Pneumococcal conjugate PCV20 (Prevnar 20) 03/11/2023       Future Appointments   Date Time Provider Department Center   08/01/2024  2:30 PM Conan JINNY Ade, MD [facility] AM     F/U Dr. Conan  in 4 weeks for skin tag removal.      Total time spent in the direct care of this patient and in reviewing records and documentation on the date of service was 30 minutes.               [1]   Current Outpatient Medications:     albuterol  HFA 108 (90 Base) MCG/ACT inhaler, Inhale 2 puffs by mouth every 6 hours as needed for shortness of breath/wheezing., Disp: 6.7 g, Rfl: 3    allopurinol  100 MG tablet, Take 2 tablets (200 mg) by mouth daily., Disp: 180 tablet, Rfl: 3    apixaban  (Eliquis ) 2.5 MG tablet, Take 1 tablet (2.5 mg) by mouth 2 times a day., Disp: 180 tablet, Rfl: 1    atorvastatin  40 MG tablet, Take 1 tablet (40 mg) by mouth every evening. To lower cholesterol., Disp: 90 tablet, Rfl: 3    blood glucose test strip, Use  up to 1 strip daily to check blood sugar., Disp: 100 strip, Rfl: 1    dulaglutide  (Trulicity ) 0.75 MG/0.5ML auto-injector, Inject 0.75 mg under the skin one time a week., Disp: 2 mL, Rfl: 5    enalapril  5 MG tablet, take one-half tablet by mouth once daily., Disp: 45 tablet, Rfl: 0    fluticasone  propionate HFA 110 MCG/ACT inhaler, Inhale 1 puff by mouth every 12 hours., Disp: 12 g, Rfl: 3    glucometer (OneTouch Verio Flex System) w/Device kit, Use to check blood sugar 4 times a day., Disp: 1 kit, Rfl: 6    glucose 4 g chewable tablet, Chew and swallow 4 tablets (16 g) by mouth every 15 minutes as needed for low blood sugar. Recheck blood sugar as needed., Disp: 50 tablet, Rfl: 3    ketoconazole  2 % shampoo, apply topically face and scalp if needed, Disp: 120 mL, Rfl: 1    loratadine  10 MG tablet, Take 1 tablet (10 mg) by mouth daily. (Patient not taking: Reported on 06/15/2024), Disp: 30 tablet, Rfl: 5    tadalafil  5 MG tablet, Take 1 tablet (5 mg) by mouth daily as needed for erectile dysfunction., Disp: 30 tablet, Rfl: 0

## 2024-08-01 ENCOUNTER — Encounter (HOSPITAL_BASED_OUTPATIENT_CLINIC_OR_DEPARTMENT_OTHER): Payer: Self-pay | Admitting: Internal Medicine

## 2024-08-01 ENCOUNTER — Other Ambulatory Visit (HOSPITAL_BASED_OUTPATIENT_CLINIC_OR_DEPARTMENT_OTHER): Payer: Self-pay | Admitting: Internal Medicine

## 2024-08-01 ENCOUNTER — Ambulatory Visit: Attending: Internal Medicine | Admitting: Internal Medicine

## 2024-08-01 VITALS — BP 119/63 | HR 83 | Temp 98.2°F | Resp 16 | Ht 66.0 in | Wt 258.0 lb

## 2024-08-01 DIAGNOSIS — D649 Anemia, unspecified: Secondary | ICD-10-CM

## 2024-08-01 DIAGNOSIS — N1831 Chronic kidney disease, stage 3a: Secondary | ICD-10-CM

## 2024-08-01 DIAGNOSIS — Z794 Long term (current) use of insulin: Secondary | ICD-10-CM | POA: Insufficient documentation

## 2024-08-01 DIAGNOSIS — E119 Type 2 diabetes mellitus without complications: Secondary | ICD-10-CM

## 2024-08-01 DIAGNOSIS — I129 Hypertensive chronic kidney disease with stage 1 through stage 4 chronic kidney disease, or unspecified chronic kidney disease: Secondary | ICD-10-CM

## 2024-08-01 DIAGNOSIS — J45909 Unspecified asthma, uncomplicated: Secondary | ICD-10-CM

## 2024-08-01 DIAGNOSIS — Z86711 Personal history of pulmonary embolism: Secondary | ICD-10-CM

## 2024-08-01 DIAGNOSIS — I252 Old myocardial infarction: Secondary | ICD-10-CM

## 2024-08-01 DIAGNOSIS — E1165 Type 2 diabetes mellitus with hyperglycemia: Secondary | ICD-10-CM | POA: Insufficient documentation

## 2024-08-01 DIAGNOSIS — Z0000 Encounter for general adult medical examination without abnormal findings: Secondary | ICD-10-CM | POA: Insufficient documentation

## 2024-08-01 LAB — CBC (HEMOGRAM)
Hematocrit: 37 % — ABNORMAL LOW (ref 38.0–50.0)
Hemoglobin: 11.7 g/dL — ABNORMAL LOW (ref 13.0–18.0)
MCH: 28.4 pg (ref 27.3–33.6)
MCHC: 31.5 g/dL — ABNORMAL LOW (ref 32.2–36.5)
MCV: 90 fL (ref 81–98)
Platelet Count: 221 10*3/uL (ref 150–400)
RBC: 4.12 10*6/uL — ABNORMAL LOW (ref 4.40–5.60)
RDW-CV: 13.6 % (ref 11.0–14.5)
WBC: 4.26 10*3/uL — ABNORMAL LOW (ref 4.3–10.0)

## 2024-08-01 LAB — COMPREHENSIVE METABOLIC PANEL
ALT (Beckman): 23 U/L (ref 10–48)
AST (Beckman): 18 U/L (ref 9–38)
Albumin: 4.1 g/dL (ref 3.5–5.2)
Alkaline Phosphatase (Total): 114 U/L (ref 36–161)
Anion Gap: 6 (ref 4–12)
Calcium: 9.3 mg/dL (ref 8.9–10.2)
Carbon Dioxide, Total: 29 meq/L (ref 22–32)
Chloride: 108 meq/L (ref 98–108)
Creatinine: 1.48 mg/dL — ABNORMAL HIGH (ref 0.51–1.18)
Glucose: 109 mg/dL (ref 62–125)
Potassium: 4.5 meq/L (ref 3.6–5.2)
Sodium: 143 meq/L (ref 135–145)
Total Bilirubin (Beckman): 0.4 mg/dL (ref 0.2–1.3)
Total Protein (Beckman): 7.1 g/dL (ref 6.0–8.2)
Urea Nitrogen: 16 mg/dL (ref 8–21)
eGFR by CKD-EPI 2021: 50 mL/min/1.73_m2 — ABNORMAL LOW (ref >59)

## 2024-08-01 MED ORDER — COVID-19 MRNA VACC (MODERNA) 50 MCG/0.5ML IM SUSY: 50.0000 ug | PREFILLED_SYRINGE | Freq: Once | INTRAMUSCULAR | Status: DC

## 2024-08-01 MED ORDER — INFLUENZA VAC SPLIT HIGH-DOSE 0.5 ML IM SUSY: 0.5000 mL | PREFILLED_SYRINGE | Freq: Once | INTRAMUSCULAR | Status: DC

## 2024-08-01 NOTE — Progress Notes (Unsigned)
 Patient was given the FIT test. Patient has done test before and needed no instruction. Exp.  01/24/25

## 2024-08-01 NOTE — Patient Instructions (Addendum)
 Today I placed a referral for an eye doctor visit.      We examined your feet through your compression stockings.     Please obtain blood work.      We will ask you to send in a FIT study.     I have placed a referral to the [facility].      We will set up visit for liquid nitrogen in the near future.

## 2024-08-02 LAB — HEMOGLOBIN A1C, HPLC: Hemoglobin A1C: 6.6 % — ABNORMAL HIGH (ref 4.0–5.6)

## 2024-08-04 ENCOUNTER — Other Ambulatory Visit (HOSPITAL_BASED_OUTPATIENT_CLINIC_OR_DEPARTMENT_OTHER): Payer: Self-pay | Admitting: Internal Medicine

## 2024-08-04 DIAGNOSIS — E1165 Type 2 diabetes mellitus with hyperglycemia: Secondary | ICD-10-CM

## 2024-08-04 DIAGNOSIS — M1A3790 Chronic gout due to renal impairment, unspecified ankle and foot, without tophus (tophi): Secondary | ICD-10-CM

## 2024-08-04 DIAGNOSIS — R7303 Prediabetes: Secondary | ICD-10-CM

## 2024-08-04 DIAGNOSIS — I10 Essential (primary) hypertension: Secondary | ICD-10-CM

## 2024-08-04 DIAGNOSIS — J45909 Unspecified asthma, uncomplicated: Secondary | ICD-10-CM

## 2024-08-04 DIAGNOSIS — E785 Hyperlipidemia, unspecified: Secondary | ICD-10-CM

## 2024-08-05 MED ORDER — ATORVASTATIN CALCIUM 40 MG OR TABS: ORAL_TABLET | ORAL | 2 refills | Status: AC

## 2024-08-05 MED ORDER — ALBUTEROL SULFATE HFA 108 (90 BASE) MCG/ACT IN AERS: 2.0000 | INHALATION_SPRAY | Freq: Four times a day (QID) | RESPIRATORY_TRACT | 3 refills | Status: AC | PRN

## 2024-08-05 MED ORDER — ENALAPRIL MALEATE 5 MG OR TABS: 2.5000 mg | ORAL_TABLET | Freq: Every day | ORAL | 0 refills | Status: AC

## 2024-08-05 MED ORDER — LANCETS THIN MISC: 3 refills | Status: AC

## 2024-08-05 MED ORDER — FLUTICASONE PROPIONATE HFA 110 MCG/ACT IN AERO: 1.0000 | INHALATION_SPRAY | Freq: Two times a day (BID) | RESPIRATORY_TRACT | 3 refills | Status: AC

## 2024-08-05 MED ORDER — GLUCOSE BLOOD VI STRP: ORAL_STRIP | 3 refills | Status: AC

## 2024-08-05 NOTE — Telephone Encounter (Signed)
 Refill request for allopurinol  requires your authorization because     [facility] Gout Protocol Failed01/15/2026 03:22 AM   Protocol Details Uric acid on record in last 24 months        Last labs found 07/11/19  Does not appear repeat labs ordered   Last visit 08/10/24 & to follow up in 4 weeks     If you want to authorize it, please indicate # of refills, sign the order and route to front desk for appointment     If this medication is denied please have your staff inform the patient and schedule an appointment if necessary.

## 2024-08-08 MED ORDER — ALLOPURINOL 100 MG OR TABS: 200.0000 mg | ORAL_TABLET | Freq: Every day | ORAL | 2 refills | Status: AC

## 2024-08-09 ENCOUNTER — Encounter (HOSPITAL_BASED_OUTPATIENT_CLINIC_OR_DEPARTMENT_OTHER): Payer: Self-pay

## 2024-08-09 ENCOUNTER — Telehealth (HOSPITAL_BASED_OUTPATIENT_CLINIC_OR_DEPARTMENT_OTHER): Payer: Self-pay

## 2024-08-09 ENCOUNTER — Other Ambulatory Visit (HOSPITAL_BASED_OUTPATIENT_CLINIC_OR_DEPARTMENT_OTHER): Payer: Self-pay

## 2024-08-09 DIAGNOSIS — E1165 Type 2 diabetes mellitus with hyperglycemia: Secondary | ICD-10-CM

## 2024-08-09 NOTE — Telephone Encounter (Signed)
 RETURN CALL: Voicemail - General Message      SUBJECT:  Appointment Request     REASON FOR VISIT: Diabetes medication  PREFERRED DATE/TIME: as soon as possible  REASON UNABLE TO APPOINT: new referral for pharmacy. Patient is eager to get scheduled. His insurance is no longer covering his weekly injection for diabetes.

## 2024-08-09 NOTE — Telephone Encounter (Signed)
 Patient last seen on 08/10/24 & to follow up in 4 weeks    Refill authorized by provider   Please schedule follow up visit.

## 2024-08-09 NOTE — Telephone Encounter (Signed)
 I returned call to patient. Patient said his insurance is not paying for Trulicity  fully and that he had to pay $4.90 each time he picks up Trulicity . Per Sherrilyn, that amount could be copay and advised patient to reach out to pharmacy or insurance company to clarify. Patient said he understood that it may be a co-pay and patient wanted to explore other injection options that don't require a co-pay, as he doesn't have co-pay for other medications.    Patient would like to have an appointment with [facility] pharmacist this week, he has one Trulicity  injection left next week.    I informed patient I would relay message to [facility], patient would like a call back.    Note forwarded to [facility]

## 2024-08-10 NOTE — Telephone Encounter (Signed)
 Spoke with patient that insurance is paying for part of monthly Trulicity  supply, he is responsible for $4.90. This is the same with Ozempic. He is not interested in daily GLP-1RA (Victoza requires PA).     Discuss Trulicity  is not an insulin . Helps with slowing stomach down. Pt initially requests for two boxes/month, thinking that he would pay $10.80 for both. We discuss that insurance is paying for it and they may not pay for TWO boxes a month. The drug is generally a couple hundred dollars.    Pt needs to pick up money from family member in [place] first. I suggest considering FA in the future if finance remains a barrier.     Pt verbalizes understanding that insurance is paying for part of Trulicity  supply and that he is responsible for $4.90.

## 2024-08-16 ENCOUNTER — Telehealth (HOSPITAL_BASED_OUTPATIENT_CLINIC_OR_DEPARTMENT_OTHER): Payer: Self-pay | Admitting: Internal Medicine

## 2024-08-16 DIAGNOSIS — R195 Other fecal abnormalities: Secondary | ICD-10-CM

## 2024-08-16 LAB — OCCULT BLOOD BY IA, STL: Occult Bld 1 Result: POSITIVE — AB

## 2024-08-16 NOTE — Telephone Encounter (Signed)
 I called Nathan [name] to notify him of the positive FIT study.  I talked to him about a colonoscopy.  He is very reluctant to proceed and feels it is a small external process.  He does not have know hemorrhoids.  After SDM he does agree to proceed with a colonoscopy.   Referral placed.       I again reviewed his vaccines.  He does not want PCV20, He declines Tdap, Shingrix, Flu/COVID/ RSV.  He declines all of them.  I will note them for postponement in HCM tab.        Eye doctor.   Visit scheduled for 2027.      He had a Insurance home visit on 08/15/24.  I have no records from that visit.

## 2024-08-19 ENCOUNTER — Telehealth (HOSPITAL_BASED_OUTPATIENT_CLINIC_OR_DEPARTMENT_OTHER): Payer: Self-pay

## 2024-08-19 NOTE — Telephone Encounter (Signed)
 Outreach: Phone Call to Patient  Care gaps: Follow up visit with your PCP    Outcome: Spoke with patient explained reason for call. Discussed patient seeing PCP to address care gaps due. Helped patient schedule appointment on 09/19/24.    CCR / PSS - Please schedule the patient for the appropriate visit type for the Care Gap(s) listed above.    Closing encounter.

## 2024-09-19 ENCOUNTER — Ambulatory Visit: Admitting: Internal Medicine

## 2024-09-20 ENCOUNTER — Ambulatory Visit

## 2024-09-20 ENCOUNTER — Ambulatory Visit (HOSPITAL_BASED_OUTPATIENT_CLINIC_OR_DEPARTMENT_OTHER): Admitting: Student in an Organized Health Care Education/Training Program

## 2025-07-28 ENCOUNTER — Ambulatory Visit: Admitting: Internal Medicine
